# Patient Record
Sex: Female | Born: 1960 | Race: White | Hispanic: No | Smoking: Former smoker
Health system: Southern US, Community
[De-identification: ages and names within clinical notes are randomized; demographics above are authoritative.]

## PROBLEM LIST (undated history)

## (undated) DIAGNOSIS — Z800 Family history of malignant neoplasm of digestive organs: Secondary | ICD-10-CM

## (undated) DIAGNOSIS — R51 Headache: Secondary | ICD-10-CM

## (undated) DIAGNOSIS — R112 Nausea with vomiting, unspecified: Secondary | ICD-10-CM

## (undated) DIAGNOSIS — I10 Essential (primary) hypertension: Secondary | ICD-10-CM

## (undated) DIAGNOSIS — K219 Gastro-esophageal reflux disease without esophagitis: Secondary | ICD-10-CM

## (undated) DIAGNOSIS — C801 Malignant (primary) neoplasm, unspecified: Secondary | ICD-10-CM

## (undated) DIAGNOSIS — Z9889 Other specified postprocedural states: Secondary | ICD-10-CM

## (undated) DIAGNOSIS — N6489 Other specified disorders of breast: Secondary | ICD-10-CM

## (undated) DIAGNOSIS — Z808 Family history of malignant neoplasm of other organs or systems: Secondary | ICD-10-CM

## (undated) DIAGNOSIS — R519 Headache, unspecified: Secondary | ICD-10-CM

## (undated) HISTORY — DX: Family history of malignant neoplasm of other organs or systems: Z80.8

## (undated) HISTORY — DX: Family history of malignant neoplasm of digestive organs: Z80.0

## (undated) HISTORY — PX: TUBAL LIGATION: SHX77

---

## 1998-03-22 ENCOUNTER — Ambulatory Visit (HOSPITAL_COMMUNITY): Admission: RE | Admit: 1998-03-22 | Discharge: 1998-03-22 | Payer: Self-pay | Admitting: *Deleted

## 1998-06-09 ENCOUNTER — Other Ambulatory Visit: Admission: RE | Admit: 1998-06-09 | Discharge: 1998-06-09 | Payer: Self-pay | Admitting: *Deleted

## 1998-09-30 DIAGNOSIS — C801 Malignant (primary) neoplasm, unspecified: Secondary | ICD-10-CM

## 1998-09-30 HISTORY — DX: Malignant (primary) neoplasm, unspecified: C80.1

## 1998-09-30 HISTORY — PX: BREAST SURGERY: SHX581

## 1999-06-28 ENCOUNTER — Other Ambulatory Visit: Admission: RE | Admit: 1999-06-28 | Discharge: 1999-06-28 | Payer: Self-pay | Admitting: *Deleted

## 1999-08-02 ENCOUNTER — Other Ambulatory Visit: Admission: RE | Admit: 1999-08-02 | Discharge: 1999-08-02 | Payer: Self-pay | Admitting: Radiology

## 1999-08-15 ENCOUNTER — Other Ambulatory Visit: Admission: RE | Admit: 1999-08-15 | Discharge: 1999-08-15 | Payer: Self-pay | Admitting: Surgery

## 1999-08-15 ENCOUNTER — Encounter (INDEPENDENT_AMBULATORY_CARE_PROVIDER_SITE_OTHER): Payer: Self-pay | Admitting: Specialist

## 1999-08-22 ENCOUNTER — Encounter: Admission: RE | Admit: 1999-08-22 | Discharge: 1999-11-20 | Payer: Self-pay | Admitting: Radiation Oncology

## 1999-08-29 ENCOUNTER — Encounter: Payer: Self-pay | Admitting: Surgery

## 1999-08-30 ENCOUNTER — Encounter: Payer: Self-pay | Admitting: Surgery

## 1999-08-30 ENCOUNTER — Encounter (INDEPENDENT_AMBULATORY_CARE_PROVIDER_SITE_OTHER): Payer: Self-pay | Admitting: *Deleted

## 1999-08-30 ENCOUNTER — Ambulatory Visit (HOSPITAL_COMMUNITY): Admission: RE | Admit: 1999-08-30 | Discharge: 1999-08-30 | Payer: Self-pay | Admitting: Surgery

## 1999-12-24 ENCOUNTER — Encounter: Admission: RE | Admit: 1999-12-24 | Discharge: 2000-03-23 | Payer: Self-pay | Admitting: Radiation Oncology

## 2000-07-08 ENCOUNTER — Other Ambulatory Visit: Admission: RE | Admit: 2000-07-08 | Discharge: 2000-07-08 | Payer: Self-pay | Admitting: *Deleted

## 2001-01-08 ENCOUNTER — Ambulatory Visit (HOSPITAL_COMMUNITY): Admission: RE | Admit: 2001-01-08 | Discharge: 2001-01-08 | Payer: Self-pay | Admitting: Gastroenterology

## 2001-01-08 ENCOUNTER — Encounter (INDEPENDENT_AMBULATORY_CARE_PROVIDER_SITE_OTHER): Payer: Self-pay | Admitting: *Deleted

## 2001-07-10 ENCOUNTER — Other Ambulatory Visit: Admission: RE | Admit: 2001-07-10 | Discharge: 2001-07-10 | Payer: Self-pay | Admitting: *Deleted

## 2004-08-02 ENCOUNTER — Ambulatory Visit: Payer: Self-pay | Admitting: Oncology

## 2004-09-17 ENCOUNTER — Ambulatory Visit: Payer: Self-pay | Admitting: Oncology

## 2004-10-16 ENCOUNTER — Other Ambulatory Visit: Admission: RE | Admit: 2004-10-16 | Discharge: 2004-10-16 | Payer: Self-pay | Admitting: Internal Medicine

## 2005-01-09 ENCOUNTER — Ambulatory Visit: Payer: Self-pay | Admitting: Oncology

## 2005-04-10 ENCOUNTER — Ambulatory Visit: Payer: Self-pay | Admitting: Oncology

## 2005-10-31 ENCOUNTER — Other Ambulatory Visit: Admission: RE | Admit: 2005-10-31 | Discharge: 2005-10-31 | Payer: Self-pay | Admitting: Internal Medicine

## 2005-11-06 ENCOUNTER — Ambulatory Visit: Payer: Self-pay | Admitting: Oncology

## 2006-05-18 ENCOUNTER — Ambulatory Visit: Payer: Self-pay | Admitting: Oncology

## 2006-11-27 ENCOUNTER — Other Ambulatory Visit: Admission: RE | Admit: 2006-11-27 | Discharge: 2006-11-27 | Payer: Self-pay | Admitting: *Deleted

## 2006-12-16 ENCOUNTER — Encounter: Admission: RE | Admit: 2006-12-16 | Discharge: 2006-12-16 | Payer: Self-pay | Admitting: Radiology

## 2007-04-14 ENCOUNTER — Ambulatory Visit: Payer: Self-pay | Admitting: Oncology

## 2007-06-11 ENCOUNTER — Ambulatory Visit: Payer: Self-pay | Admitting: Oncology

## 2007-12-17 ENCOUNTER — Other Ambulatory Visit: Admission: RE | Admit: 2007-12-17 | Discharge: 2007-12-17 | Payer: Self-pay | Admitting: Family Medicine

## 2008-04-15 ENCOUNTER — Ambulatory Visit: Payer: Self-pay | Admitting: Oncology

## 2009-02-03 ENCOUNTER — Ambulatory Visit (HOSPITAL_COMMUNITY): Admission: RE | Admit: 2009-02-03 | Discharge: 2009-02-03 | Payer: Self-pay | Admitting: Obstetrics

## 2009-02-03 ENCOUNTER — Encounter (INDEPENDENT_AMBULATORY_CARE_PROVIDER_SITE_OTHER): Payer: Self-pay | Admitting: Obstetrics

## 2010-03-20 ENCOUNTER — Other Ambulatory Visit: Admission: RE | Admit: 2010-03-20 | Discharge: 2010-03-20 | Payer: Self-pay | Admitting: Family Medicine

## 2011-01-08 LAB — COMPREHENSIVE METABOLIC PANEL
ALT: 25 U/L (ref 0–35)
AST: 23 U/L (ref 0–37)
Albumin: 4.1 g/dL (ref 3.5–5.2)
Alkaline Phosphatase: 85 U/L (ref 39–117)
BUN: 12 mg/dL (ref 6–23)
CO2: 27 mEq/L (ref 19–32)
Calcium: 9.7 mg/dL (ref 8.4–10.5)
Chloride: 103 mEq/L (ref 96–112)
Creatinine, Ser: 0.85 mg/dL (ref 0.4–1.2)
GFR calc Af Amer: >60 mL/min (ref >60)
GFR calc non Af Amer: >60 mL/min (ref >60)
Glucose, Bld: 110 mg/dL — ABNORMAL HIGH (ref 70–99)
Potassium: 4.3 mEq/L (ref 3.5–5.1)
Sodium: 138 mEq/L (ref 135–145)
Total Bilirubin: 0.5 mg/dL (ref 0.3–1.2)
Total Protein: 7 g/dL (ref 6.0–8.3)

## 2011-01-08 LAB — CBC
HCT: 39.9 % (ref 36.0–46.0)
Hemoglobin: 14.1 g/dL (ref 12.0–15.0)
MCHC: 35.4 g/dL (ref 30.0–36.0)
MCV: 91.8 fL (ref 78.0–100.0)
Platelets: 260 10*3/uL (ref 150–400)
RBC: 4.34 MIL/uL (ref 3.87–5.11)
RDW: 12.6 % (ref 11.5–15.5)
WBC: 5.3 10*3/uL (ref 4.0–10.5)

## 2011-01-08 LAB — URINALYSIS, ROUTINE W REFLEX MICROSCOPIC
Glucose, UA: NEGATIVE mg/dL
pH: 5.5 (ref 5.0–8.0)

## 2011-01-08 LAB — TYPE AND SCREEN
ABO/RH(D): B POS
Antibody Screen: NEGATIVE

## 2011-02-12 NOTE — Op Note (Signed)
[name], [name] NO.:  [id number]   MEDICAL RECORD NO.:  [id number]         PATIENT TYPE:  WOIB   LOCATION:                                FACILITY:  [facility]   PHYSICIAN:  Lendon Colonel, MD   DATE OF BIRTH:  [birth year]   DATE OF PROCEDURE:  02/03/2009  DATE OF DISCHARGE:                               OPERATIVE REPORT   PREOPERATIVE DIAGNOSES:  Lateral wall vaginal cyst, rectocele.   POSTOPERATIVE DIAGNOSIS:  Lateral wall vaginal cyst, rectocele.   PROCEDURE:  Removal of vaginal cyst, rectocele repair.   SURGEON:  Lendon Colonel, MD   ASSISTANT:  Eliberto Ivory. Dickstein, MD   ANESTHESIA:  General.   FINDING:  Grade 2 rectocele, 5-cm right lateral vaginal wall cyst of  unclear etiology.   SPECIMENS:  Vaginal cyst, this was sent to Pathology.   ANTIBIOTICS:  2 g of Ancef.   ESTIMATED BLOOD LOSS:  Minimal.   COMPLICATIONS:  None.   INDICATION:  This is a 50 year old postmenopausal patient with history  of prolapsing 5-cm right lateral vaginal wall cyst and a grade 2  rectocele.  Options for management including cyst drainage and pessary  were discussed with the patient.  The patient elected to proceed with  surgical options after a full and informed consent including risk of  postoperative dyspareunia.   PROCEDURE:  After informed consent was obtained, the patient was taken  to the operating room where general anesthesia was initiated without  difficulty.  She was prepped and draped in the normal sterile fashion in  dorsal lithotomy position.  A Foley catheter was placed into the  bladder.  Both, vaginal and rectal examinations were done by the surgeon  and the assistant for preoperative mapping.  It became clear that the  vaginal cyst wall was not connected to either the rectum or bladder.  Decision was made to completely dissect out that cyst and then proceed  with a rectocele repair.  The edges of the vaginal skin overlying the  cyst were marked  with Allis clamps.  A solution of 0.5% Sensorcaine with  100,000-200,000 units of epinephrine were injected throughout the case.  A total of 10 mL were injected in the vaginal mucosa.  This was done for  pain control and hemostasis.  The vaginal mucosa over the cyst wall were  tagged with Allis clamps and the mucosal tissue was injected as noted  above.  A #15 blade was used to incise the vaginal mucosa along the  length of the vaginal cyst and using Strully and Metzenbaum scissors as  well as Allis clamps placed along the edges of the incision, the vaginal  cyst wall was dissected out both sharply and blunt dissection with a  gauze on a finger was used.  The cyst dissected out nicely although at  one point, an ostomy was created in the cyst with drainage of a brownish  liquid.  No malodor was noted to the liquid and repeat rectal exams were  performed several times during the case to ensure that there was no  connection between the cyst and the rectum.  Gloves were changed each  time a rectal exam was carried out.  The cyst was completely dissected  out, was about 5 cm and at the base of the cyst, a Kelly clamp was  placed along the base.  The cyst was excised, a free tie and then a  suture tie was done along the base of the cyst.  The specimen was sent  to Pathology.  The defect in the vaginal mucosa was left.  A gauze was  placed for hemostasis and the rectocele was begun.  Kocher clamps were  placed on the hymenal ring and a third was placed on the perineal skin.  A triangular piece of skin was excised with the scalpel and Metzenbaums  after being injected with the solution.  Another Allis clamp was placed  at the apex of the rectocele, this was also the lower limit of the  dissection for the vaginal cyst wall.  The mucosa was scored with Bovie  cautery to ensure dissection stayed in the midline.  Hydrodissection was  carried out with the anesthetic with epinephrine and a Metzenbaum  was  used to undermine the vaginal mucosa and incise the vaginal mucosa in  the midline.  Allis clamps were placed on the edges of the vaginal  mucosa and using blunt and sharp dissection with both, Metzenbaums and  Strully, the rectovaginal fascia was separated from the vaginal mucosa.  Once fully dissected and hemostatic, interrupted 2-0 Vicryls were used  to plicate the rectovaginal fascia in a horizontal fashion.  Excellent  supportive recreation of the rectovaginal fascia was noted.  The vaginal  mucosa was then closed in a running fashion with 2-0 Vicryl.  The  rectocele was repaired separately from the vaginal cyst repair as that  mucosal defect deviated laterally to the right.  Once at the hymenal  ring, decision was made to proceed with a suture for perineal support.  0 Vicryl on CT1 was used to reapproximate the bulbocavernosus and the  levator muscle, 2 separate stitches were used.  The second stitch  grabbing rectovaginal mucosa to bulbocavernosus on the left to  bulbocavernosus on the right and rectovaginal fascia on the right.  These stitches were then tied down.  The remainder of the incision was  repaired in a standard episiotomy format.  A single 4-0 Vicryl was used  on the left vaginal mucosa to repair a single defect.  Excellent  hemostasis was noted.  The procedure was terminated after examining  finger in the rectum revealed no suture in the rectum.  Estrace cream  was placed in the vagina.  No vaginal packing was done.  The Foley  catheter was removed and the procedure was terminated.  The patient  tolerated the procedure well.  Sponge, lap, and needle counts were  correct x3.  The patient was taken to the recovery room in a stable  condition.      Lendon Colonel, MD  Electronically Signed     KAF/MEDQ  D:  02/03/2009  T:  02/04/2009  Job:  [id number]

## 2011-02-15 NOTE — Procedures (Signed)
. [facility]  Patient:    [name], [name]                          MRN: [id number] Proc. Date: 01/08/01 Attending:  Verlin Grills, M.D. CC:         Darius Bump, M.D.  Leighton Roach. Truett Perna, M.D.   Procedure Report  REFERRING PHYSICIANS:  Leighton Roach. Truett Perna, M.D., and Darius Bump, M.D.  PROCEDURE INDICATION:  [name] (DOB: 1961/03/28) is a 50 year old female.  She has received treatment for breast cancer.  Her mother was diagnosed with colon cancer.  Ms. Jehle is due for surveillance colonoscopy and polypectomy to prevent colon cancer.  I discussed with Ms. Hoffert the complications associated with colonoscopy and polypectomy including intestinal bleeding and intestinal perforation.  Ms. Cromie has signed the operative permit.  ENDOSCOPIST:  Verlin Grills, M.D.  PREMEDICATION:  Fentanyl 50 mcg and Versed 7.5 mg.  ENDOSCOPE: Olympus pediatric colonoscope.  PROCEDURE:  After obtaining an informed consent, the patient was placed in the left lateral decubitus position.  She was administered intravenous Fentanyl and intravenous Versed to achieve conscious sedation for the procedure.  The patients blood pressure, oxygen saturation, and cardiac rhythm were monitored throughout the procedure and documented in the medical record.  Anal inspection was normal.  Digital rectal exam was normal.  The Olympus pediatric video colonoscope was introduced into the rectum, and under direct vision advanced to the cecum.  This was identified by normal-appearing ileocecal valve.  Colonic preparation for the exam today was excellent.  Rectum:  from the proximal rectum, a 0.5 mm sessile polyp was removed with the cold biopsy forceps and submitted for pathological interpretation.  Sigmoid colon and descending colon:  extensive left colonic diverticulosis.  Splenic flexure:  normal.  Transverse colon:  normal.  Hepatic flexure:   normal.  Ascending colon:  normal.  Cecum and ileocecal valve:  normal.  ASSESSMENT: 1. Left colonic diverticulosis. 2. A 0.5 mm sessile polyp was removed from the rectum.  RECOMMENDATIONS:  Repeat colonoscopy in five years. DD:  01/08/01 TD:  01/08/01 Job: [id number]

## 2013-02-16 ENCOUNTER — Other Ambulatory Visit (HOSPITAL_COMMUNITY)
Admission: RE | Admit: 2013-02-16 | Discharge: 2013-02-16 | Disposition: A | Discharge status: Home / Self Care | Payer: Federal, State, Local not specified - PPO | Source: Ambulatory Visit | Attending: Family Medicine | Admitting: Family Medicine

## 2013-02-16 ENCOUNTER — Other Ambulatory Visit: Payer: Self-pay | Admitting: Family Medicine

## 2013-02-16 DIAGNOSIS — Z124 Encounter for screening for malignant neoplasm of cervix: Secondary | ICD-10-CM | POA: Insufficient documentation

## 2014-03-21 ENCOUNTER — Ambulatory Visit: Payer: Self-pay | Admitting: Podiatry

## 2014-03-24 ENCOUNTER — Ambulatory Visit (INDEPENDENT_AMBULATORY_CARE_PROVIDER_SITE_OTHER): Payer: Federal, State, Local not specified - PPO

## 2014-03-24 ENCOUNTER — Encounter: Payer: Self-pay | Admitting: Podiatry

## 2014-03-24 ENCOUNTER — Ambulatory Visit (INDEPENDENT_AMBULATORY_CARE_PROVIDER_SITE_OTHER): Payer: Federal, State, Local not specified - PPO | Admitting: Podiatry

## 2014-03-24 VITALS — BP 138/87 | HR 55 | Resp 15 | Ht 66.0 in | Wt 171.0 lb

## 2014-03-24 DIAGNOSIS — B079 Viral wart, unspecified: Secondary | ICD-10-CM

## 2014-03-24 DIAGNOSIS — D3610 Benign neoplasm of peripheral nerves and autonomic nervous system, unspecified: Secondary | ICD-10-CM

## 2014-03-24 DIAGNOSIS — D219 Benign neoplasm of connective and other soft tissue, unspecified: Secondary | ICD-10-CM

## 2014-03-24 NOTE — Progress Notes (Signed)
Subjective:     Patient ID: [name], female   DOB: 10/12/1960, 53 y.o.   MRN: [id number]  HPI patient presents with several problems on her left foot one being large plantar keratotic lesion around the fourth metatarsal and one being shooting radiating pain between the third and fourth toes that occurs at different times especially with certain types of shoe gear   Review of Systems  All other systems reviewed and are negative.      Objective:   Physical Exam  Nursing note and vitals reviewed. Constitutional: She is oriented to person, place, and time.  Cardiovascular: Intact distal pulses.   Musculoskeletal: Normal range of motion.  Neurological: She is oriented to person, place, and time.  Skin: Skin is warm.   neurovascular status intact with muscle strength adequate and range of motion subtalar midtarsal joint within normal limits. Later lesion left it's quite large measuring about 1.5 x 1.5 cm that upon debridement I did note pinpoint bleeding and pain to lateral pressure. 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 ray. Did inject the left third interspace with a neuro lysis dehydration and alcohol solution today and after appropriate debridement of the plantar lesion applied chemical to attempt to invoke a response and kill viral tissue. Reappoint to recheck

## 2014-03-24 NOTE — Progress Notes (Signed)
   Subjective:    Patient ID: [name], female    DOB: 10-28-1960, 53 y.o.   MRN: [id number]  HPI Comments: Pt states she believes she has a plantars wart on the left plantar foot under the 5th toe, and possibly on the left medial heel, for about 9 months.  Pt complains of a sharp shooting pain in the left 3, 4th toes, like a severe cramp, for about 9 months.     Review of Systems  All other systems reviewed and are negative.      Objective:   Physical Exam        Assessment & Plan:

## 2014-04-21 ENCOUNTER — Encounter: Payer: Self-pay | Admitting: Podiatry

## 2014-04-21 ENCOUNTER — Ambulatory Visit (INDEPENDENT_AMBULATORY_CARE_PROVIDER_SITE_OTHER): Payer: Federal, State, Local not specified - PPO | Admitting: Podiatry

## 2014-04-21 VITALS — BP 115/69 | HR 61 | Resp 16

## 2014-04-21 DIAGNOSIS — G5762 Lesion of plantar nerve, left lower limb: Secondary | ICD-10-CM

## 2014-04-21 DIAGNOSIS — G5760 Lesion of plantar nerve, unspecified lower limb: Secondary | ICD-10-CM

## 2014-04-21 DIAGNOSIS — B079 Viral wart, unspecified: Secondary | ICD-10-CM

## 2014-04-22 NOTE — Progress Notes (Signed)
Subjective:     Patient ID: [name], female   DOB: 02/15/1961, 53 y.o.   MRN: [id number]  HPI patient presents stating the lesion is still present on the bottom my foot that seems some better and I'm still getting pain between the third and fourth toes in my left foot with shooting pains noted upon wearing of tighter shoes   Review of Systems     Objective:   Physical Exam Neurovascular status intact with no health history changes noted and noted that there is discomfort still between the third and fourth toes and lesion underneath the left foot that upon debridement continues to exhibit pinpoint bleeding but appears to be much thinner    Assessment:     Neuroma symptoms still present left and also verruca plantaris left which is improving but still present    Plan:     Reviewed both conditions and did a neuro lysis injection third interspace with a purified dehydrated alcohol solution and Marcaine. I then went ahead and debris did plantar lesion left and applied chemical to kill remaining wart tissue

## 2014-05-19 ENCOUNTER — Ambulatory Visit (INDEPENDENT_AMBULATORY_CARE_PROVIDER_SITE_OTHER): Payer: Federal, State, Local not specified - PPO | Admitting: Podiatry

## 2014-05-19 ENCOUNTER — Encounter: Payer: Self-pay | Admitting: Podiatry

## 2014-05-19 VITALS — BP 132/76 | HR 61 | Resp 16

## 2014-05-19 DIAGNOSIS — G5760 Lesion of plantar nerve, unspecified lower limb: Secondary | ICD-10-CM

## 2014-05-19 DIAGNOSIS — D219 Benign neoplasm of connective and other soft tissue, unspecified: Secondary | ICD-10-CM

## 2014-05-19 DIAGNOSIS — D3610 Benign neoplasm of peripheral nerves and autonomic nervous system, unspecified: Secondary | ICD-10-CM

## 2014-05-19 DIAGNOSIS — G5762 Lesion of plantar nerve, left lower limb: Secondary | ICD-10-CM

## 2014-05-19 NOTE — Progress Notes (Signed)
Subjective:     Patient ID: [name], female   DOB: 06-20-1961, 53 y.o.   MRN: [id number]  HPI patient states that the left foot is doing much better underneath and also in between the third and fourth toes with significant diminishment of   Review of Systems     Objective:   Physical Exam Neurovascular status intact with significant diminishment of forefoot pain left with inflammation of a mild nature noted on the plantar surface but keratotic lesions which have completely resolved currently    Assessment:     Improve verruca plantaris left and improve neuroma symptomatology left    Plan:     Reviewed conditions and discussed possibility for recurrent. Today cleaned up a little bit of tissue but cannot recommend anything invasive and less symptoms were to warrant

## 2014-10-17 ENCOUNTER — Other Ambulatory Visit: Payer: Self-pay | Admitting: Family Medicine

## 2014-10-17 ENCOUNTER — Ambulatory Visit
Admission: RE | Admit: 2014-10-17 | Discharge: 2014-10-17 | Disposition: A | Discharge status: Home / Self Care | Payer: Federal, State, Local not specified - PPO | Source: Ambulatory Visit | Attending: Family Medicine | Admitting: Family Medicine

## 2014-10-17 DIAGNOSIS — R059 Cough, unspecified: Secondary | ICD-10-CM

## 2014-10-17 DIAGNOSIS — R05 Cough: Secondary | ICD-10-CM

## 2015-06-16 ENCOUNTER — Encounter: Payer: Self-pay | Admitting: Podiatry

## 2015-06-16 ENCOUNTER — Ambulatory Visit (INDEPENDENT_AMBULATORY_CARE_PROVIDER_SITE_OTHER): Payer: Federal, State, Local not specified - PPO | Admitting: Podiatry

## 2015-06-16 ENCOUNTER — Ambulatory Visit (INDEPENDENT_AMBULATORY_CARE_PROVIDER_SITE_OTHER): Payer: Federal, State, Local not specified - PPO

## 2015-06-16 VITALS — BP 120/68 | HR 69 | Resp 16

## 2015-06-16 DIAGNOSIS — M722 Plantar fascial fibromatosis: Secondary | ICD-10-CM | POA: Diagnosis not present

## 2015-06-16 DIAGNOSIS — M79671 Pain in right foot: Secondary | ICD-10-CM

## 2015-06-16 MED ORDER — TRIAMCINOLONE ACETONIDE 10 MG/ML IJ SUSP: 10.0000 mg | Freq: Once | INTRAMUSCULAR | Status: DC

## 2015-06-16 MED ORDER — DICLOFENAC SODIUM 75 MG PO TBEC: 75.0000 mg | DELAYED_RELEASE_TABLET | Freq: Two times a day (BID) | ORAL | Status: DC

## 2015-06-16 NOTE — Patient Instructions (Signed)

## 2015-06-18 NOTE — Progress Notes (Signed)
Subjective:     Patient ID: [name], female   DOB: [birth year], 54 y.o.   MRN: [id number]  HPI patient presents stating I have had pain in my right heel and it's been getting gradually worse. States it's been hurting for several months and making it increasingly difficult to walk and she has tried reduced activity and supportive shoes without relief   Review of Systems  All other systems reviewed and are negative.      Objective:   Physical Exam Vascular status intact muscle strength adequate with generalized foot pain noted right and specific pain to the right plantar fascia at the insertion of the tendon into the calcaneus    Assessment:     Generalized foot pain which may be compensatory in nature or related to inflammatory capsulitis and acute plantar fasciitis right    Plan:     H&P and x-rays reviewed and both conditions discussed. Today I injected the right plantar fascia 3 Milligan Kenalog 5 mg Xylocaine and instructed on physical therapy and utilization of fascially brace. Discussed other pain and hopefully will resolve with anti-inflammatory reduced activity and better walking gait

## 2015-07-05 ENCOUNTER — Encounter: Payer: Self-pay | Admitting: Podiatry

## 2015-07-05 ENCOUNTER — Ambulatory Visit (INDEPENDENT_AMBULATORY_CARE_PROVIDER_SITE_OTHER): Payer: Federal, State, Local not specified - PPO | Admitting: Podiatry

## 2015-07-05 DIAGNOSIS — M722 Plantar fascial fibromatosis: Secondary | ICD-10-CM

## 2015-07-05 DIAGNOSIS — M79671 Pain in right foot: Secondary | ICD-10-CM

## 2015-07-05 MED ORDER — DICLOFENAC SODIUM 75 MG PO TBEC: 75.0000 mg | DELAYED_RELEASE_TABLET | Freq: Two times a day (BID) | ORAL | Status: DC

## 2015-07-05 MED ORDER — TRIAMCINOLONE ACETONIDE 10 MG/ML IJ SUSP
10.0000 mg | Freq: Once | INTRAMUSCULAR | Status: AC
  Administered 2015-07-05: 10 mg

## 2015-07-05 NOTE — Progress Notes (Signed)
Subjective:     Patient ID: [name], female   DOB: 1961/09/12, 54 y.o.   MRN: [id number]  HPI patient states my foot is improved but still painful when present and it's been going on for a while   Review of Systems     Objective:   Physical Exam Neurovascular status intact with discomfort in the plantar fascial right that upon palpation still shows to be quite intense when pressed with fluid buildup and moderate depression of the arch    Assessment:     Acute plantar fasciitis still present with depression of the arch indicating long-term problem    Plan:     Reinjected the plantar fascia right 3 mg Kenalog 5 mill grams Xylocaine and instructed on physical therapy and dispensed and scanned for a custom type orthotic device of a Berkley nature in order to try to support the heel and arch and will be at least 10 mm in depth

## 2015-07-28 ENCOUNTER — Ambulatory Visit: Payer: Federal, State, Local not specified - PPO | Admitting: *Deleted

## 2015-07-28 DIAGNOSIS — M722 Plantar fascial fibromatosis: Secondary | ICD-10-CM

## 2015-07-28 NOTE — Patient Instructions (Signed)

## 2015-07-28 NOTE — Progress Notes (Signed)
Patient ID: [name], female   DOB: 1961-03-14, 54 y.o.   MRN: [id number] Patient presents for orthotic pick up.  Verbal and written break in and wear instructions given.  Patient will follow up in 4 weeks if symptoms worsen or fail to improve.

## 2015-08-28 ENCOUNTER — Ambulatory Visit: Payer: Federal, State, Local not specified - PPO | Admitting: Podiatry

## 2016-03-14 ENCOUNTER — Other Ambulatory Visit (HOSPITAL_COMMUNITY)
Admission: RE | Admit: 2016-03-14 | Discharge: 2016-03-14 | Disposition: A | Discharge status: Home / Self Care | Payer: Federal, State, Local not specified - PPO | Source: Ambulatory Visit | Attending: Family Medicine | Admitting: Family Medicine

## 2016-03-14 ENCOUNTER — Other Ambulatory Visit: Payer: Self-pay | Admitting: Family Medicine

## 2016-03-14 DIAGNOSIS — Z124 Encounter for screening for malignant neoplasm of cervix: Secondary | ICD-10-CM | POA: Diagnosis present

## 2016-03-18 LAB — CYTOLOGY - PAP

## 2016-04-25 ENCOUNTER — Other Ambulatory Visit: Payer: Self-pay | Admitting: Gastroenterology

## 2016-05-10 ENCOUNTER — Encounter (HOSPITAL_COMMUNITY): Payer: Self-pay | Admitting: *Deleted

## 2016-05-21 ENCOUNTER — Encounter (HOSPITAL_COMMUNITY): Payer: Self-pay | Admitting: *Deleted

## 2016-05-21 ENCOUNTER — Ambulatory Visit (HOSPITAL_COMMUNITY)
Admission: RE | Admit: 2016-05-21 | Discharge: 2016-05-21 | Disposition: A | Discharge status: Home / Self Care | Payer: Federal, State, Local not specified - PPO | Source: Ambulatory Visit | Attending: Gastroenterology | Admitting: Gastroenterology

## 2016-05-21 ENCOUNTER — Encounter (HOSPITAL_COMMUNITY)
Admission: RE | Disposition: A | Discharge status: Home / Self Care | Payer: Self-pay | Source: Ambulatory Visit | Attending: Gastroenterology

## 2016-05-21 ENCOUNTER — Ambulatory Visit (HOSPITAL_COMMUNITY): Payer: Federal, State, Local not specified - PPO | Admitting: Certified Registered Nurse Anesthetist

## 2016-05-21 DIAGNOSIS — I341 Nonrheumatic mitral (valve) prolapse: Secondary | ICD-10-CM | POA: Insufficient documentation

## 2016-05-21 DIAGNOSIS — K219 Gastro-esophageal reflux disease without esophagitis: Secondary | ICD-10-CM | POA: Diagnosis not present

## 2016-05-21 DIAGNOSIS — Z87891 Personal history of nicotine dependence: Secondary | ICD-10-CM | POA: Diagnosis not present

## 2016-05-21 DIAGNOSIS — I340 Nonrheumatic mitral (valve) insufficiency: Secondary | ICD-10-CM | POA: Insufficient documentation

## 2016-05-21 DIAGNOSIS — Z800 Family history of malignant neoplasm of digestive organs: Secondary | ICD-10-CM | POA: Insufficient documentation

## 2016-05-21 DIAGNOSIS — Z9221 Personal history of antineoplastic chemotherapy: Secondary | ICD-10-CM | POA: Insufficient documentation

## 2016-05-21 DIAGNOSIS — Z1211 Encounter for screening for malignant neoplasm of colon: Secondary | ICD-10-CM | POA: Diagnosis not present

## 2016-05-21 DIAGNOSIS — Z853 Personal history of malignant neoplasm of breast: Secondary | ICD-10-CM | POA: Diagnosis not present

## 2016-05-21 DIAGNOSIS — K5730 Diverticulosis of large intestine without perforation or abscess without bleeding: Secondary | ICD-10-CM | POA: Diagnosis not present

## 2016-05-21 DIAGNOSIS — Z923 Personal history of irradiation: Secondary | ICD-10-CM | POA: Insufficient documentation

## 2016-05-21 HISTORY — DX: Headache: R51

## 2016-05-21 HISTORY — DX: Headache, unspecified: R51.9

## 2016-05-21 HISTORY — DX: Malignant (primary) neoplasm, unspecified: C80.1

## 2016-05-21 HISTORY — PX: COLONOSCOPY WITH PROPOFOL: SHX5780

## 2016-05-21 HISTORY — DX: Other specified postprocedural states: Z98.890

## 2016-05-21 HISTORY — DX: Gastro-esophageal reflux disease without esophagitis: K21.9

## 2016-05-21 HISTORY — DX: Nausea with vomiting, unspecified: R11.2

## 2016-05-21 SURGERY — COLONOSCOPY WITH PROPOFOL
Anesthesia: Monitor Anesthesia Care

## 2016-05-21 MED ORDER — ONDANSETRON HCL 4 MG/2ML IJ SOLN
INTRAMUSCULAR | Status: AC
  Filled 2016-05-21: qty 2

## 2016-05-21 MED ORDER — LIDOCAINE HCL (CARDIAC) 20 MG/ML IV SOLN
INTRAVENOUS | Status: DC | PRN
  Administered 2016-05-21: 100 mg via INTRAVENOUS

## 2016-05-21 MED ORDER — LIDOCAINE HCL (CARDIAC) 20 MG/ML IV SOLN
INTRAVENOUS | Status: AC
  Filled 2016-05-21: qty 5

## 2016-05-21 MED ORDER — ONDANSETRON HCL 4 MG/2ML IJ SOLN
INTRAMUSCULAR | Status: DC | PRN
  Administered 2016-05-21: 4 mg via INTRAVENOUS

## 2016-05-21 MED ORDER — PROPOFOL 10 MG/ML IV BOLUS
INTRAVENOUS | Status: DC | PRN
  Administered 2016-05-21 (×3): 20 mg via INTRAVENOUS
  Administered 2016-05-21: 40 mg via INTRAVENOUS

## 2016-05-21 MED ORDER — LACTATED RINGERS IV SOLN
INTRAVENOUS | Status: DC | PRN
  Administered 2016-05-21: 13:00:00 via INTRAVENOUS

## 2016-05-21 MED ORDER — PROPOFOL 10 MG/ML IV BOLUS
INTRAVENOUS | Status: AC
  Filled 2016-05-21: qty 60

## 2016-05-21 MED ORDER — PROPOFOL 500 MG/50ML IV EMUL
INTRAVENOUS | Status: DC | PRN
  Administered 2016-05-21: 75 ug/kg/min via INTRAVENOUS

## 2016-05-21 SURGICAL SUPPLY — 22 items

## 2016-05-21 NOTE — Op Note (Signed)
[facility] Patient Name: [name] Procedure Date: 05/21/2016 MRN: [id number] Attending MD: Garlan Fair , MD Date of Birth: May 22, 1961 CSN: [id number] Age: 55 Admit Type: Outpatient Procedure:                Colonoscopy Indications:              Screening in patient at increased risk: Colorectal                            cancer in mother before age 61 Providers:                Garlan Fair, MD, Laverta Baltimore RN, RN,                            Cherylynn Ridges, Technician, Adair Laundry, CRNA Referring MD:              Medicines:                Propofol per Anesthesia Complications:            No immediate complications. Estimated Blood Loss:     Estimated blood loss: none. Procedure:                Pre-Anesthesia Assessment:                           - Prior to the procedure, a History and Physical                            was performed, and patient medications and                            allergies were reviewed. The patient's tolerance of                            previous anesthesia was also reviewed. The risks                            and benefits of the procedure and the sedation                            options and risks were discussed with the patient.                            All questions were answered, and informed consent                            was obtained. Prior Anticoagulants: The patient has                            taken no previous anticoagulant or antiplatelet                            agents. ASA Grade Assessment: II - A patient with  mild systemic disease. After reviewing the risks                            and benefits, the patient was deemed in                            satisfactory condition to undergo the procedure.                           After obtaining informed consent, the colonoscope                            was passed under direct vision. Throughout the      procedure, the patient's blood pressure, pulse, and                            oxygen saturations were monitored continuously. The                            [id number] scope was introduced through                            the anus and advanced to the the cecum, identified                            by appendiceal orifice and ileocecal valve. The                            colonoscopy was technically difficult and complex                            due to significant looping. The patient tolerated                            the procedure well. The quality of the bowel                            preparation was good. The ileocecal valve, the                            appendiceal orifice and the rectum were                            photographed. Scope In: 1:08:11 PM Scope Out: 1:28:55 PM Scope Withdrawal Time: 0 hours 7 minutes 27 seconds  Total Procedure Duration: 0 hours 20 minutes 44 seconds  Findings:      The perianal and digital rectal examinations were normal.      The entire examined colon appeared normal. Left colonic diverticulosis       was present. Impression:               - The entire examined colon is normal.                           - No specimens  collected. Moderate Sedation:      N/A- Per Anesthesia Care Recommendation:           - Patient has a contact number available for                            emergencies. The signs and symptoms of potential                            delayed complications were discussed with the                            patient. Return to normal activities tomorrow.                            Written discharge instructions were provided to the                            patient.                           - Repeat colonoscopy in 5 years for screening                            purposes.                           - Resume previous diet.                           - Continue present medications. Procedure Code(s):        ---  Professional ---                           [id number], Colorectal cancer screening; colonoscopy on                            individual at high risk Diagnosis Code(s):        --- Professional ---                           Z80.0, Family history of malignant neoplasm of                            digestive organs CPT copyright 2016 [employer]. All rights reserved. The codes documented in this report are preliminary and upon coder review may  be revised to meet current compliance requirements. Earle Gell, MD Garlan Fair, MD 05/21/2016 1:36:20 PM This report has been signed electronically. Number of Addenda: 0

## 2016-05-21 NOTE — Anesthesia Postprocedure Evaluation (Signed)
Anesthesia Post Note  Patient: [name]  Procedure(s) Performed: Procedure(s) (LRB): COLONOSCOPY WITH PROPOFOL (N/A)  Patient location during evaluation: PACU Anesthesia Type: MAC Level of consciousness: awake and alert Pain management: pain level controlled Vital Signs Assessment: post-procedure vital signs reviewed and stable Respiratory status: spontaneous breathing, nonlabored ventilation, respiratory function stable and patient connected to nasal cannula oxygen Cardiovascular status: stable and blood pressure returned to baseline Anesthetic complications: no    Last Vitals:  Vitals:   05/21/16 1357 05/21/16 1358  BP: (!) 145/88 136/77  Pulse: (!) 40 (!) 43  Resp: 10 11  Temp:      Last Pain:  Vitals:   05/21/16 1126  TempSrc: Oral                 [name]

## 2016-05-21 NOTE — H&P (Signed)
  Procedure: Screening colonoscopy. 02/12/2011 normal baseline screening colonoscopy was performed. Mother was diagnosed with colon cancer under age 55  History: The patient is a 55 year old female born Feb 19, 1961. She is scheduled to undergo a repeat screening colonoscopy today  Past medical history: Breast cancer. Breast lumpectomy followed by radiation and chemotherapy. Hypercholesterolemia. Mild mitral valve prolapse with mitral regurgitation. Cesarean section. Bilateral tubal ligation.  Allergies: Latex  Exam: The patient is alert and lying comfortably on the endoscopy stretcher. Abdomen is soft and nontender to palpation. Lungs are clear to auscultation. Cardiac exam reveals a regular rhythm.  Plan: Proceed with screening colonoscopy

## 2016-05-21 NOTE — Anesthesia Preprocedure Evaluation (Signed)
Anesthesia Evaluation  Patient identified by MRN, date of birth, ID band Patient awake    Reviewed: Allergy & Precautions, H&P , NPO status , Patient's Chart, lab work & pertinent test results  History of Anesthesia Complications (+) PONV and history of anesthetic complications  Airway Mallampati: II  TM Distance: >3 FB Neck ROM: full    Dental no notable dental hx.    Pulmonary former smoker,    Pulmonary exam normal breath sounds clear to auscultation       Cardiovascular negative cardio ROS Normal cardiovascular exam Rhythm:regular Rate:Normal     Neuro/Psych negative neurological ROS     GI/Hepatic Neg liver ROS, GERD  ,  Endo/Other  negative endocrine ROS  Renal/GU negative Renal ROS     Musculoskeletal   Abdominal   Peds  Hematology negative hematology ROS (+)   Anesthesia Other Findings   Reproductive/Obstetrics negative OB ROS                             Anesthesia Physical Anesthesia Plan  ASA: II  Anesthesia Plan: MAC   Post-op Pain Management:    Induction: Intravenous  Airway Management Planned:   Additional Equipment:   Intra-op Plan:   Post-operative Plan:   Informed Consent: I have reviewed the patients History and Physical, chart, labs and discussed the procedure including the risks, benefits and alternatives for the proposed anesthesia with the patient or authorized representative who has indicated his/her understanding and acceptance.   Dental Advisory Given  Plan Discussed with: Anesthesiologist, CRNA and Surgeon  Anesthesia Plan Comments:         Anesthesia Quick Evaluation

## 2016-05-21 NOTE — Transfer of Care (Signed)
Immediate Anesthesia Transfer of Care Note  Patient: [name]  Procedure(s) Performed: Procedure(s): COLONOSCOPY WITH PROPOFOL (N/A)  Patient Location: ENDO  Anesthesia Type:MAC  Level of Consciousness:  sedated, patient cooperative and responds to stimulation  Airway & Oxygen Therapy:Patient Spontanous Breathing and Patient connected to face mask oxgen  Post-op Assessment:  Report given to ENDO RN and Post -op Vital signs reviewed and stable  Post vital signs:  Reviewed and stable  Last Vitals:  Vitals:   05/21/16 1126  BP: 130/75  Resp: 10  Temp: 123XX123 C    Complications: No apparent anesthesia complications

## 2016-05-21 NOTE — Discharge Instructions (Signed)

## 2016-05-22 ENCOUNTER — Encounter (HOSPITAL_COMMUNITY): Payer: Self-pay | Admitting: Gastroenterology

## 2016-09-09 DIAGNOSIS — Z1231 Encounter for screening mammogram for malignant neoplasm of breast: Secondary | ICD-10-CM | POA: Diagnosis not present

## 2016-09-09 DIAGNOSIS — Z853 Personal history of malignant neoplasm of breast: Secondary | ICD-10-CM | POA: Diagnosis not present

## 2016-09-12 DIAGNOSIS — E782 Mixed hyperlipidemia: Secondary | ICD-10-CM | POA: Diagnosis not present

## 2018-04-14 ENCOUNTER — Emergency Department (HOSPITAL_COMMUNITY)
Admission: EM | Admit: 2018-04-14 | Discharge: 2018-04-14 | Disposition: A | Discharge status: Home / Self Care | Payer: Federal, State, Local not specified - PPO | Attending: Emergency Medicine | Admitting: Emergency Medicine

## 2018-04-14 ENCOUNTER — Encounter (HOSPITAL_COMMUNITY): Payer: Self-pay | Admitting: Emergency Medicine

## 2018-04-14 ENCOUNTER — Emergency Department (HOSPITAL_COMMUNITY): Payer: Federal, State, Local not specified - PPO

## 2018-04-14 DIAGNOSIS — R001 Bradycardia, unspecified: Secondary | ICD-10-CM | POA: Diagnosis not present

## 2018-04-14 DIAGNOSIS — Z79899 Other long term (current) drug therapy: Secondary | ICD-10-CM | POA: Insufficient documentation

## 2018-04-14 DIAGNOSIS — Z853 Personal history of malignant neoplasm of breast: Secondary | ICD-10-CM | POA: Insufficient documentation

## 2018-04-14 DIAGNOSIS — Z87891 Personal history of nicotine dependence: Secondary | ICD-10-CM | POA: Insufficient documentation

## 2018-04-14 DIAGNOSIS — H539 Unspecified visual disturbance: Secondary | ICD-10-CM | POA: Diagnosis not present

## 2018-04-14 DIAGNOSIS — R05 Cough: Secondary | ICD-10-CM | POA: Insufficient documentation

## 2018-04-14 DIAGNOSIS — R059 Cough, unspecified: Secondary | ICD-10-CM

## 2018-04-14 DIAGNOSIS — R197 Diarrhea, unspecified: Secondary | ICD-10-CM | POA: Diagnosis not present

## 2018-04-14 LAB — URINALYSIS, ROUTINE W REFLEX MICROSCOPIC
Bacteria, UA: NONE SEEN
Bilirubin Urine: NEGATIVE
Glucose, UA: NEGATIVE mg/dL
Hgb urine dipstick: NEGATIVE
Ketones, ur: 20 mg/dL — AB
Nitrite: NEGATIVE
Protein, ur: NEGATIVE mg/dL
Specific Gravity, Urine: 1.008 (ref 1.005–1.030)
pH: 5 (ref 5.0–8.0)

## 2018-04-14 LAB — COMPREHENSIVE METABOLIC PANEL
ALT: 33 U/L (ref 0–44)
AST: 35 U/L (ref 15–41)
Albumin: 3.9 g/dL (ref 3.5–5.0)
Alkaline Phosphatase: 79 U/L (ref 38–126)
Anion gap: 9 (ref 5–15)
BUN: 9 mg/dL (ref 6–20)
CO2: 25 mmol/L (ref 22–32)
Calcium: 9.7 mg/dL (ref 8.9–10.3)
Chloride: 108 mmol/L (ref 98–111)
Creatinine, Ser: 0.94 mg/dL (ref 0.44–1.00)
GFR calc Af Amer: >60 mL/min (ref >60)
GFR calc non Af Amer: >60 mL/min (ref >60)
Glucose, Bld: 96 mg/dL (ref 70–99)
Potassium: 3.6 mmol/L (ref 3.5–5.1)
Sodium: 142 mmol/L (ref 135–145)
Total Bilirubin: 1 mg/dL (ref 0.3–1.2)
Total Protein: 7.4 g/dL (ref 6.5–8.1)

## 2018-04-14 LAB — RAPID URINE DRUG SCREEN, HOSP PERFORMED
AMPHETAMINES: NOT DETECTED
Benzodiazepines: NOT DETECTED
Cocaine: NOT DETECTED
OPIATES: NOT DETECTED
Tetrahydrocannabinol: NOT DETECTED

## 2018-04-14 LAB — SALICYLATE LEVEL: Salicylate Lvl: <7 mg/dL (ref 2.8–30.0)

## 2018-04-14 LAB — CBC WITH DIFFERENTIAL/PLATELET
Abs Immature Granulocytes: 0 10*3/uL (ref 0.0–0.1)
Basophils Absolute: 0 10*3/uL (ref 0.0–0.1)
Basophils Relative: 1 %
Eosinophils Absolute: 0.1 10*3/uL (ref 0.0–0.7)
Eosinophils Relative: 2 %
HCT: 38.5 % (ref 36.0–46.0)
Hemoglobin: 12.9 g/dL (ref 12.0–15.0)
Immature Granulocytes: 0 %
Lymphocytes Relative: 31 %
Lymphs Abs: 2.1 10*3/uL (ref 0.7–4.0)
MCH: 31.2 pg (ref 26.0–34.0)
MCHC: 33.5 g/dL (ref 30.0–36.0)
MCV: 93.2 fL (ref 78.0–100.0)
Monocytes Absolute: 0.5 10*3/uL (ref 0.1–1.0)
Monocytes Relative: 8 %
Neutro Abs: 3.8 10*3/uL (ref 1.7–7.7)
Neutrophils Relative %: 58 %
Platelets: 319 10*3/uL (ref 150–400)
RBC: 4.13 MIL/uL (ref 3.87–5.11)
RDW: 12.5 % (ref 11.5–15.5)
WBC: 6.6 10*3/uL (ref 4.0–10.5)

## 2018-04-14 LAB — ACETAMINOPHEN LEVEL: Acetaminophen (Tylenol), Serum: <10 ug/mL — ABNORMAL LOW (ref 10–30)

## 2018-04-14 LAB — I-STAT BETA HCG BLOOD, ED (MC, WL, AP ONLY): I-stat hCG, quantitative: <5 m[IU]/mL (ref <5)

## 2018-04-14 NOTE — ED Notes (Signed)
Patient transported to X-ray 

## 2018-04-14 NOTE — Discharge Instructions (Signed)
Please see the information and instructions below regarding your visit.  Your diagnoses today include:  1. Cough     Tests performed today include: See side panel of your discharge paperwork for testing performed today. Vital signs are listed at the bottom of these instructions.   Medications prescribed:    Take any prescribed medications only as prescribed, and any over the counter medications only as directed on the packaging.  Home care instructions:  Please follow any educational materials contained in this packet.   Follow-up instructions: Please follow-up with your primary care provider in one week for further evaluation of your symptoms if they are not completely improved.   Return instructions:  Please return to the Emergency Department if you experience worsening symptoms.  Please return if you have any other emergent concerns.  Additional Information:   Your vital signs today were: BP 130/67    Pulse 78    Temp 98.7 F (37.1 C) (Oral)    Resp 13    Ht 5\' 6"  (1.676 m)    Wt 78.9 kg (174 lb)    SpO2 99%    BMI 28.08 kg/m  If your blood pressure (BP) was elevated on multiple readings during this visit above 130 for the top number or above 80 for the bottom number, please have this repeated by your primary care provider within one month. --------------  Thank you for allowing [place] to participate in your care today.
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
## 2018-04-14 NOTE — ED Notes (Signed)
Pt stable, ambulatory, states understanding of discharge instructions 
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
## 2018-05-08 ENCOUNTER — Other Ambulatory Visit (HOSPITAL_COMMUNITY)
Admission: RE | Admit: 2018-05-08 | Discharge: 2018-05-08 | Disposition: A | Discharge status: Home / Self Care | Payer: Federal, State, Local not specified - PPO | Source: Ambulatory Visit | Attending: Family Medicine | Admitting: Family Medicine

## 2018-05-08 ENCOUNTER — Other Ambulatory Visit: Payer: Self-pay | Admitting: Family Medicine

## 2018-05-08 DIAGNOSIS — Z113 Encounter for screening for infections with a predominantly sexual mode of transmission: Secondary | ICD-10-CM | POA: Diagnosis not present

## 2018-05-08 DIAGNOSIS — E782 Mixed hyperlipidemia: Secondary | ICD-10-CM | POA: Diagnosis not present

## 2018-05-08 DIAGNOSIS — Z124 Encounter for screening for malignant neoplasm of cervix: Secondary | ICD-10-CM | POA: Diagnosis not present

## 2018-05-08 DIAGNOSIS — E010 Iodine-deficiency related diffuse (endemic) goiter: Secondary | ICD-10-CM

## 2018-05-08 DIAGNOSIS — Z1159 Encounter for screening for other viral diseases: Secondary | ICD-10-CM | POA: Diagnosis not present

## 2018-05-08 DIAGNOSIS — Z0000 Encounter for general adult medical examination without abnormal findings: Secondary | ICD-10-CM | POA: Diagnosis not present

## 2018-05-11 LAB — CYTOLOGY - PAP
CHLAMYDIA, DNA PROBE: NEGATIVE
DIAGNOSIS: NEGATIVE
NEISSERIA GONORRHEA: NEGATIVE

## 2018-05-14 ENCOUNTER — Ambulatory Visit
Admission: RE | Admit: 2018-05-14 | Discharge: 2018-05-14 | Disposition: A | Discharge status: Home / Self Care | Payer: Federal, State, Local not specified - PPO | Source: Ambulatory Visit | Attending: Family Medicine | Admitting: Family Medicine

## 2018-05-14 DIAGNOSIS — E010 Iodine-deficiency related diffuse (endemic) goiter: Secondary | ICD-10-CM

## 2018-05-14 DIAGNOSIS — E041 Nontoxic single thyroid nodule: Secondary | ICD-10-CM | POA: Diagnosis not present

## 2018-05-21 DIAGNOSIS — N6489 Other specified disorders of breast: Secondary | ICD-10-CM | POA: Diagnosis not present

## 2018-07-08 DIAGNOSIS — C50919 Malignant neoplasm of unspecified site of unspecified female breast: Secondary | ICD-10-CM | POA: Diagnosis not present

## 2018-07-10 ENCOUNTER — Ambulatory Visit: Payer: Self-pay | Admitting: Plastic Surgery

## 2018-07-14 ENCOUNTER — Other Ambulatory Visit: Payer: Self-pay

## 2018-07-14 ENCOUNTER — Encounter (HOSPITAL_BASED_OUTPATIENT_CLINIC_OR_DEPARTMENT_OTHER): Payer: Self-pay | Admitting: *Deleted

## 2018-07-23 ENCOUNTER — Ambulatory Visit (HOSPITAL_BASED_OUTPATIENT_CLINIC_OR_DEPARTMENT_OTHER)
Admission: RE | Admit: 2018-07-23 | Discharge: 2018-07-23 | Disposition: A | Discharge status: Home / Self Care | Payer: Federal, State, Local not specified - PPO | Source: Ambulatory Visit | Attending: Plastic Surgery | Admitting: Plastic Surgery

## 2018-07-23 ENCOUNTER — Encounter (HOSPITAL_BASED_OUTPATIENT_CLINIC_OR_DEPARTMENT_OTHER)
Admission: RE | Disposition: A | Discharge status: Home / Self Care | Payer: Self-pay | Source: Ambulatory Visit | Attending: Plastic Surgery

## 2018-07-23 ENCOUNTER — Ambulatory Visit (HOSPITAL_BASED_OUTPATIENT_CLINIC_OR_DEPARTMENT_OTHER): Payer: Federal, State, Local not specified - PPO | Admitting: Anesthesiology

## 2018-07-23 ENCOUNTER — Other Ambulatory Visit: Payer: Self-pay

## 2018-07-23 ENCOUNTER — Encounter (HOSPITAL_BASED_OUTPATIENT_CLINIC_OR_DEPARTMENT_OTHER): Payer: Self-pay

## 2018-07-23 DIAGNOSIS — Z853 Personal history of malignant neoplasm of breast: Secondary | ICD-10-CM | POA: Insufficient documentation

## 2018-07-23 DIAGNOSIS — K219 Gastro-esophageal reflux disease without esophagitis: Secondary | ICD-10-CM | POA: Diagnosis not present

## 2018-07-23 DIAGNOSIS — C50911 Malignant neoplasm of unspecified site of right female breast: Secondary | ICD-10-CM | POA: Diagnosis not present

## 2018-07-23 DIAGNOSIS — C50919 Malignant neoplasm of unspecified site of unspecified female breast: Secondary | ICD-10-CM | POA: Diagnosis not present

## 2018-07-23 DIAGNOSIS — Z79899 Other long term (current) drug therapy: Secondary | ICD-10-CM | POA: Diagnosis not present

## 2018-07-23 DIAGNOSIS — N651 Disproportion of reconstructed breast: Principal | ICD-10-CM

## 2018-07-23 DIAGNOSIS — Z87891 Personal history of nicotine dependence: Secondary | ICD-10-CM | POA: Insufficient documentation

## 2018-07-23 DIAGNOSIS — Z923 Personal history of irradiation: Secondary | ICD-10-CM | POA: Diagnosis not present

## 2018-07-23 DIAGNOSIS — N6489 Other specified disorders of breast: Secondary | ICD-10-CM | POA: Insufficient documentation

## 2018-07-23 DIAGNOSIS — Z91040 Latex allergy status: Secondary | ICD-10-CM | POA: Diagnosis not present

## 2018-07-23 DIAGNOSIS — Z9221 Personal history of antineoplastic chemotherapy: Secondary | ICD-10-CM | POA: Insufficient documentation

## 2018-07-23 HISTORY — PX: BREAST RECONSTRUCTION: SHX9

## 2018-07-23 HISTORY — PX: LIPOSUCTION WITH LIPOFILLING: SHX6436

## 2018-07-23 SURGERY — RECONSTRUCTION, BREAST
Anesthesia: General | Site: Breast | Laterality: Right

## 2018-07-23 MED ORDER — FENTANYL CITRATE (PF) 100 MCG/2ML IJ SOLN: 25.0000 ug | INTRAMUSCULAR | Status: DC | PRN

## 2018-07-23 MED ORDER — DEXAMETHASONE SODIUM PHOSPHATE 10 MG/ML IJ SOLN
INTRAMUSCULAR | Status: DC | PRN
  Administered 2018-07-23: 10 mg via INTRAVENOUS

## 2018-07-23 MED ORDER — FENTANYL CITRATE (PF) 100 MCG/2ML IJ SOLN: 50.0000 ug | INTRAMUSCULAR | Status: DC | PRN

## 2018-07-23 MED ORDER — ONDANSETRON HCL 4 MG/2ML IJ SOLN
INTRAMUSCULAR | Status: AC
  Filled 2018-07-23: qty 2

## 2018-07-23 MED ORDER — BACITRACIN ZINC 500 UNIT/GM EX OINT
TOPICAL_OINTMENT | CUTANEOUS | Status: AC
  Filled 2018-07-23: qty 28.35

## 2018-07-23 MED ORDER — ACETAMINOPHEN 160 MG/5ML PO SOLN: 325.0000 mg | ORAL | Status: DC | PRN

## 2018-07-23 MED ORDER — PROPOFOL 10 MG/ML IV BOLUS
INTRAVENOUS | Status: DC | PRN
  Administered 2018-07-23: 150 mg via INTRAVENOUS

## 2018-07-23 MED ORDER — MEPERIDINE HCL 25 MG/ML IJ SOLN: 6.2500 mg | INTRAMUSCULAR | Status: DC | PRN

## 2018-07-23 MED ORDER — SODIUM CHLORIDE 0.9 % IJ SOLN
INTRAMUSCULAR | Status: AC
  Filled 2018-07-23: qty 20

## 2018-07-23 MED ORDER — FENTANYL CITRATE (PF) 100 MCG/2ML IJ SOLN
INTRAMUSCULAR | Status: AC
  Filled 2018-07-23: qty 2

## 2018-07-23 MED ORDER — LACTATED RINGERS IV SOLN
INTRAVENOUS | Status: DC
  Administered 2018-07-23 (×3): via INTRAVENOUS

## 2018-07-23 MED ORDER — LIDOCAINE-EPINEPHRINE 1 %-1:100000 IJ SOLN
INTRAMUSCULAR | Status: AC
  Filled 2018-07-23: qty 2

## 2018-07-23 MED ORDER — CEFAZOLIN SODIUM-DEXTROSE 2-4 GM/100ML-% IV SOLN
INTRAVENOUS | Status: AC
  Filled 2018-07-23: qty 100

## 2018-07-23 MED ORDER — CEFAZOLIN SODIUM-DEXTROSE 2-4 GM/100ML-% IV SOLN
2.0000 g | INTRAVENOUS | Status: AC
  Administered 2018-07-23: 2 g via INTRAVENOUS

## 2018-07-23 MED ORDER — OXYCODONE HCL 5 MG/5ML PO SOLN: 5.0000 mg | Freq: Once | ORAL | Status: DC | PRN

## 2018-07-23 MED ORDER — MIDAZOLAM HCL 2 MG/2ML IJ SOLN
INTRAMUSCULAR | Status: DC | PRN
  Administered 2018-07-23: 2 mg via INTRAVENOUS

## 2018-07-23 MED ORDER — MIDAZOLAM HCL 2 MG/2ML IJ SOLN: 1.0000 mg | INTRAMUSCULAR | Status: DC | PRN

## 2018-07-23 MED ORDER — FENTANYL CITRATE (PF) 100 MCG/2ML IJ SOLN
INTRAMUSCULAR | Status: DC | PRN
  Administered 2018-07-23 (×3): 50 ug via INTRAVENOUS

## 2018-07-23 MED ORDER — OXYCODONE HCL 5 MG PO TABS: 5.0000 mg | ORAL_TABLET | Freq: Once | ORAL | Status: DC | PRN

## 2018-07-23 MED ORDER — EPINEPHRINE PF 1 MG/ML IJ SOLN
INTRAMUSCULAR | Status: DC | PRN
  Administered 2018-07-23: 2 mL

## 2018-07-23 MED ORDER — LIDOCAINE HCL (CARDIAC) PF 100 MG/5ML IV SOSY
PREFILLED_SYRINGE | INTRAVENOUS | Status: DC | PRN
  Administered 2018-07-23: 60 mg via INTRAVENOUS

## 2018-07-23 MED ORDER — BUPIVACAINE HCL (PF) 0.25 % IJ SOLN
INTRAMUSCULAR | Status: AC
  Filled 2018-07-23: qty 30

## 2018-07-23 MED ORDER — MIDAZOLAM HCL 2 MG/2ML IJ SOLN
INTRAMUSCULAR | Status: AC
  Filled 2018-07-23: qty 2

## 2018-07-23 MED ORDER — ACETAMINOPHEN 325 MG PO TABS: 325.0000 mg | ORAL_TABLET | ORAL | Status: DC | PRN

## 2018-07-23 MED ORDER — LIDOCAINE-EPINEPHRINE 1 %-1:100000 IJ SOLN
INTRAMUSCULAR | Status: AC
  Filled 2018-07-23: qty 1

## 2018-07-23 MED ORDER — ONDANSETRON HCL 4 MG/2ML IJ SOLN
4.0000 mg | Freq: Once | INTRAMUSCULAR | Status: AC | PRN
  Administered 2018-07-23: 4 mg via INTRAVENOUS

## 2018-07-23 MED ORDER — CHLORHEXIDINE GLUCONATE CLOTH 2 % EX PADS: 6.0000 | MEDICATED_PAD | Freq: Once | CUTANEOUS | Status: DC

## 2018-07-23 MED ORDER — SCOPOLAMINE 1 MG/3DAYS TD PT72: 1.0000 | MEDICATED_PATCH | Freq: Once | TRANSDERMAL | Status: DC | PRN

## 2018-07-23 MED ORDER — EPHEDRINE SULFATE 50 MG/ML IJ SOLN
INTRAMUSCULAR | Status: DC | PRN
  Administered 2018-07-23 (×8): 10 mg via INTRAVENOUS

## 2018-07-23 MED ORDER — DEXAMETHASONE SODIUM PHOSPHATE 10 MG/ML IJ SOLN
INTRAMUSCULAR | Status: AC
  Filled 2018-07-23: qty 1

## 2018-07-23 MED ORDER — BUPIVACAINE LIPOSOME 1.3 % IJ SUSP
INTRAMUSCULAR | Status: AC
  Filled 2018-07-23: qty 20

## 2018-07-23 MED ORDER — SODIUM CHLORIDE 0.9 % IV SOLN
INTRAVENOUS | Status: DC | PRN
  Administered 2018-07-23: 2000 mL

## 2018-07-23 SURGICAL SUPPLY — 89 items
APL SKNCLS STERI-STRIP NONHPOA (GAUZE/BANDAGES/DRESSINGS) ×4
BAG DECANTER FOR FLEXI CONT (MISCELLANEOUS) ×3 IMPLANT
BENZOIN TINCTURE PRP APPL 2/3 (GAUZE/BANDAGES/DRESSINGS) ×6 IMPLANT
BINDER ABDOMINAL  9 SM 30-45 (SOFTGOODS)
BINDER ABDOMINAL 10 UNV 27-48 (MISCELLANEOUS) IMPLANT
BINDER ABDOMINAL 12 SM 30-45 (SOFTGOODS) IMPLANT
BINDER ABDOMINAL 9 SM 30-45 (SOFTGOODS) IMPLANT
BLADE HEX COATED 2.75 (ELECTRODE) ×3 IMPLANT
BLADE KNIFE PERSONA 10 (BLADE) IMPLANT
BLADE KNIFE PERSONA 15 (BLADE) ×6 IMPLANT
BNDG GAUZE ELAST 4 BULKY (GAUZE/BANDAGES/DRESSINGS) ×6 IMPLANT
CANISTER LIPO FAT HARVEST (MISCELLANEOUS) IMPLANT
CANISTER SUCT 1200ML W/VALVE (MISCELLANEOUS) ×3 IMPLANT
CANNULA LIPO 3.0X8 (CANNULA) ×1 IMPLANT
CAP BOUFFANT 24 BLUE NURSES (PROTECTIVE WEAR) ×3 IMPLANT
COVER BACK TABLE 60X90IN (DRAPES) ×3 IMPLANT
COVER MAYO STAND STRL (DRAPES) ×6 IMPLANT
COVER WAND RF STERILE (DRAPES) IMPLANT
DECANTER SPIKE VIAL GLASS SM (MISCELLANEOUS) ×6 IMPLANT
DRAIN CHANNEL 10F 3/8 F FF (DRAIN) IMPLANT
DRAIN CHANNEL 10M FLAT 3/4 FLT (DRAIN) IMPLANT
DRAIN CHANNEL 7F 3/4 FLAT (WOUND CARE) IMPLANT
DRAPE LAPAROSCOPIC ABDOMINAL (DRAPES) ×2 IMPLANT
DRAPE U-SHAPE 76X120 STRL (DRAPES) ×6 IMPLANT
DRSG EMULSION OIL 3X3 NADH (GAUZE/BANDAGES/DRESSINGS) IMPLANT
DRSG PAD ABDOMINAL 8X10 ST (GAUZE/BANDAGES/DRESSINGS) ×2 IMPLANT
DRSG TELFA 3X8 NADH (GAUZE/BANDAGES/DRESSINGS) IMPLANT
ELECT REM PT RETURN 9FT ADLT (ELECTROSURGICAL) ×3
ELECTRODE REM PT RTRN 9FT ADLT (ELECTROSURGICAL) ×2 IMPLANT
EVACUATOR SILICONE 100CC (DRAIN) IMPLANT
FILTER 7/8 IN (FILTER) ×3 IMPLANT
GAUZE SPONGE 4X4 12PLY STRL (GAUZE/BANDAGES/DRESSINGS) ×6 IMPLANT
GLOVE BIOGEL PI IND STRL 6.5 (GLOVE) IMPLANT
GLOVE BIOGEL PI INDICATOR 6.5 (GLOVE)
GLOVE SURG SS PI 6.5 STRL IVOR (GLOVE) ×3 IMPLANT
GLOVE SURG SS PI 7.0 STRL IVOR (GLOVE) ×3 IMPLANT
GOWN STRL REUS W/ TWL LRG LVL3 (GOWN DISPOSABLE) ×4 IMPLANT
GOWN STRL REUS W/TWL LRG LVL3 (GOWN DISPOSABLE) ×6
IV CONNECTOR CLAVE PORT MALE (IV SETS) ×3 IMPLANT
IV NS 500ML (IV SOLUTION)
IV NS 500ML BAXH (IV SOLUTION) IMPLANT
LINER CANISTER 1000CC FLEX (MISCELLANEOUS) ×6 IMPLANT
MANIFOLD NEPTUNE II (INSTRUMENTS) IMPLANT
MARKER SKIN DUAL TIP RULER LAB (MISCELLANEOUS) ×3 IMPLANT
NDL HYPO 25X1 1.5 SAFETY (NEEDLE) ×4 IMPLANT
NDL SAFETY ECLIPSE 18X1.5 (NEEDLE) IMPLANT
NDL SPNL 18GX3.5 QUINCKE PK (NEEDLE) ×2 IMPLANT
NEEDLE HYPO 18GX1.5 SHARP (NEEDLE)
NEEDLE HYPO 25X1 1.5 SAFETY (NEEDLE) ×6 IMPLANT
NEEDLE SPNL 18GX3.5 QUINCKE PK (NEEDLE) ×3 IMPLANT
NS IRRIG 1000ML POUR BTL (IV SOLUTION) ×6 IMPLANT
PACK BASIN DAY SURGERY FS (CUSTOM PROCEDURE TRAY) ×3 IMPLANT
PAD DRESSING TELFA 3X8 NADH (GAUZE/BANDAGES/DRESSINGS) IMPLANT
PENCIL BUTTON HOLSTER BLD 10FT (ELECTRODE) ×2 IMPLANT
PIN SAFETY STERILE (MISCELLANEOUS) IMPLANT
SCRUB TECHNI CARE 4 OZ NO DYE (MISCELLANEOUS) ×6 IMPLANT
SHEET MEDIUM DRAPE 40X70 STRL (DRAPES) ×6 IMPLANT
SLEEVE SCD COMPRESS KNEE MED (MISCELLANEOUS) ×3 IMPLANT
SPECIMEN JAR MEDIUM (MISCELLANEOUS) ×9 IMPLANT
SPECIMEN JAR X LARGE (MISCELLANEOUS) IMPLANT
SPONGE LAP 18X18 RF (DISPOSABLE) ×9 IMPLANT
STAPLER VISISTAT 35W (STAPLE) ×3 IMPLANT
STRIP CLOSURE SKIN 1/2X4 (GAUZE/BANDAGES/DRESSINGS) ×6 IMPLANT
SUT ETHIBOND 3-0 V-5 (SUTURE) IMPLANT
SUT ETHILON 3 0 PS 1 (SUTURE) ×3 IMPLANT
SUT MERSILENE 2.0 SH NDLE (SUTURE) IMPLANT
SUT MNCRL AB 3-0 PS2 18 (SUTURE) ×6 IMPLANT
SUT MNCRL AB 4-0 PS2 18 (SUTURE) ×6 IMPLANT
SUT MON AB 5-0 PS2 18 (SUTURE) IMPLANT
SUT PROLENE 3 0 PS 1 (SUTURE) IMPLANT
SUT VLOC 90 P-14 23 (SUTURE) IMPLANT
SYR 20CC LL (SYRINGE) ×6 IMPLANT
SYR 50ML LL SCALE MARK (SYRINGE) ×13 IMPLANT
SYR BULB IRRIGATION 50ML (SYRINGE) ×6 IMPLANT
SYR CONTROL 10ML LL (SYRINGE) ×8 IMPLANT
SYR TB 1ML LL NO SAFETY (SYRINGE) IMPLANT
TAPE MEASURE VINYL STERILE (MISCELLANEOUS) ×3 IMPLANT
TOWEL GREEN STERILE FF (TOWEL DISPOSABLE) ×9 IMPLANT
TOWEL OR NON WOVEN STRL DISP B (DISPOSABLE) ×3 IMPLANT
TRAY DSU PREP LF (CUSTOM PROCEDURE TRAY) ×6 IMPLANT
TRAY FOL W/BAG SLVR 16FR STRL (SET/KITS/TRAYS/PACK) IMPLANT
TRAY FOLEY W/BAG SLVR 14FR LF (SET/KITS/TRAYS/PACK) IMPLANT
TRAY FOLEY W/BAG SLVR 16FR LF (SET/KITS/TRAYS/PACK)
TUBE CONNECTING 20X1/4 (TUBING) ×3 IMPLANT
TUBING INFILTRATION IT-10001 (TUBING) IMPLANT
TUBING SET GRADUATE ASPIR 12FT (MISCELLANEOUS) ×3 IMPLANT
UNDERPAD 30X30 (UNDERPADS AND DIAPERS) ×6 IMPLANT
VAC PENCILS W/TUBING CLEAR (MISCELLANEOUS) ×3 IMPLANT
YANKAUER SUCT BULB TIP NO VENT (SUCTIONS) ×3 IMPLANT

## 2018-07-23 NOTE — Anesthesia Preprocedure Evaluation (Addendum)
Anesthesia Evaluation  Patient identified by MRN, date of birth, ID band Patient awake    Reviewed: Allergy & Precautions, H&P , NPO status , Patient's Chart, lab work & pertinent test results  History of Anesthesia Complications (+) PONV and history of anesthetic complications  Airway Mallampati: II  TM Distance: >3 FB Neck ROM: full    Dental no notable dental hx. (+) Teeth Intact   Pulmonary former smoker,    Pulmonary exam normal breath sounds clear to auscultation       Cardiovascular negative cardio ROS Normal cardiovascular exam Rhythm:regular Rate:Normal     Neuro/Psych negative neurological ROS     GI/Hepatic Neg liver ROS, GERD  ,  Endo/Other  negative endocrine ROS  Renal/GU negative Renal ROS     Musculoskeletal   Abdominal   Peds  Hematology negative hematology ROS (+)   Anesthesia Other Findings   Reproductive/Obstetrics negative OB ROS                            Anesthesia Physical  Anesthesia Plan  ASA: II  Anesthesia Plan: General   Post-op Pain Management:    Induction: Intravenous  PONV Risk Score and Plan: 3 and Ondansetron, Treatment may vary due to age or medical condition, Scopolamine patch - Pre-op, Dexamethasone and Midazolam  Airway Management Planned: Oral ETT and LMA  Additional Equipment:   Intra-op Plan:   Post-operative Plan: Extubation in OR  Informed Consent: I have reviewed the patients History and Physical, chart, labs and discussed the procedure including the risks, benefits and alternatives for the proposed anesthesia with the patient or authorized representative who has indicated his/her understanding and acceptance.   Dental Advisory Given  Plan Discussed with: Anesthesiologist, CRNA and Surgeon  Anesthesia Plan Comments: (  )        Anesthesia Quick Evaluation

## 2018-07-23 NOTE — Transfer of Care (Signed)
Immediate Anesthesia Transfer of Care Note  Patient: [name]  Procedure(s) Performed: LEFT BREAST RECONSTRUCTION (Left Breast) LIPOSUCTION AND FAT GRAFTING (Left Abdomen)  Patient Location: PACU  Anesthesia Type:General  Level of Consciousness: awake, alert  and oriented  Airway & Oxygen Therapy: Patient Spontanous Breathing and Patient connected to face mask oxygen  Post-op Assessment: Report given to RN and Post -op Vital signs reviewed and stable  Post vital signs: Reviewed and stable  Last Vitals:  Vitals Value Taken Time  BP    Temp    Pulse    Resp    SpO2      Last Pain:  Vitals:   07/23/18 1120  TempSrc: Oral  PainSc: 0-No pain         Complications: No apparent anesthesia complications

## 2018-07-23 NOTE — Brief Op Note (Signed)
07/23/2018  3:22 PM  PATIENT:  [name]  57 y.o. female  PRE-OPERATIVE DIAGNOSIS: 1) Right Breast Cancer and 2) Breast Asymmetry  POST-OPERATIVE DIAGNOSIS: 1) Right Breast Cancer and 2) Breast Asymmetry  PROCEDURE:  1) Liposuction of the Abdomen and 2) Right Breast Reconstruction with Microfat Grafting  SURGEON:  Surgeon(s) and Role:    * Beecher Furio, Audrea Muscat, MD - Primary  ANESTHESIA:   general  EBL:  50 cc   BLOOD ADMINISTERED:none  DRAINS: none   LOCAL MEDICATIONS USED:  1.3% Exparel (total 266 mgs.)  SPECIMEN:  No Specimen  DISPOSITION OF SPECIMEN:  N/A  COUNTS:  YES  DICTATION: .Other Dictation: Dictation Number [id number]  PLAN OF CARE: Discharge to home after PACU  PATIENT DISPOSITION:  PACU - hemodynamically stable.   Delay start of Pharmacological VTE agent (>24hrs) due to surgical blood loss or risk of bleeding: N/A

## 2018-07-23 NOTE — Discharge Instructions (Signed)
1. No lifting greater than 5 lbs with arms for 4 weeks. 2. Percocet 5/325 mg tabs 1-2 tabs po q 4-6 hours prn pain- prescription given in office. 3. Keflex 500 mgf 1 tab po four times a day- prescription given in office. 4. Sterapred dose pack as directed- prescription given in office. 5. Follow-up appointment Friday in office.     Post Anesthesia Home Care Instructions  Activity: Get plenty of rest for the remainder of the day. A responsible individual must stay with you for 24 hours following the procedure.  For the next 24 hours, DO NOT: -Drive a car -Paediatric nurse -Drink alcoholic beverages -Take any medication unless instructed by your physician -Make any legal decisions or sign important papers.  Meals: Start with liquid foods such as gelatin or soup. Progress to regular foods as tolerated. Avoid greasy, spicy, heavy foods. If nausea and/or vomiting occur, drink only clear liquids until the nausea and/or vomiting subsides. Call your physician if vomiting continues.  Special Instructions/Symptoms: Your throat may feel dry or sore from the anesthesia or the breathing tube placed in your throat during surgery. If this causes discomfort, gargle with warm salt water. The discomfort should disappear within 24 hours.  If you had a scopolamine patch placed behind your ear for the management of post- operative nausea and/or vomiting:  1. The medication in the patch is effective for 72 hours, after which it should be removed.  Wrap patch in a tissue and discard in the trash. Wash hands thoroughly with soap and water. 2. You may remove the patch earlier than 72 hours if you experience unpleasant side effects which may include dry mouth, dizziness or visual disturbances. 3. Avoid touching the patch. Wash your hands with soap and water after contact with the patch.   Call your surgeon if you experience:   1.  Fever over 101.0. 2.  Inability to urinate. 3.  Nausea and/or vomiting. 4.   Extreme swelling or bruising at the surgical site. 5.  Continued bleeding from the incision. 6.  Increased pain, redness or drainage from the incision. 7.  Problems related to your pain medication. 8.  Any problems and/or concernsCall your surgeon if you experience:

## 2018-07-23 NOTE — Anesthesia Procedure Notes (Signed)
Procedure Name: LMA Insertion Date/Time: 07/23/2018 12:29 PM Performed by: Verita Lamb, CRNA Pre-anesthesia Checklist: Patient identified, Emergency Drugs available, Suction available and Patient being monitored Patient Re-evaluated:Patient Re-evaluated prior to induction Oxygen Delivery Method: Circle system utilized Preoxygenation: Pre-oxygenation with 100% oxygen Induction Type: IV induction Ventilation: Mask ventilation without difficulty LMA: LMA inserted LMA Size: 4.0 Number of attempts: 1 Airway Equipment and Method: Bite block Placement Confirmation: positive ETCO2 Tube secured with: Tape Dental Injury: Teeth and Oropharynx as per pre-operative assessment

## 2018-07-23 NOTE — H&P (Signed)
  H&P faxed to surgical center.  -History and Physical Reviewed  -Patient has been re-examined  -No change in the plan of care  [name],[name]    

## 2018-07-23 NOTE — Anesthesia Postprocedure Evaluation (Signed)
Anesthesia Post Note  Patient: [name]  Procedure(s) Performed: LEFT BREAST RECONSTRUCTION (Left Breast) LIPOSUCTION AND FAT GRAFTING (Left Abdomen)     Patient location during evaluation: PACU Anesthesia Type: General Level of consciousness: awake and alert Pain management: pain level controlled Vital Signs Assessment: post-procedure vital signs reviewed and stable Respiratory status: spontaneous breathing, nonlabored ventilation, respiratory function stable and patient connected to nasal cannula oxygen Cardiovascular status: blood pressure returned to baseline and stable Postop Assessment: no apparent nausea or vomiting Anesthetic complications: no    Last Vitals:  Vitals:   07/23/18 1540 07/23/18 1545  BP:  135/74  Pulse: 74 76  Resp: 17 16  Temp:  36.4 C  SpO2: 99% 100%    Last Pain:  Vitals:   07/23/18 1545  TempSrc:   PainSc: 5                  Emile Kyllo

## 2018-07-24 ENCOUNTER — Encounter (HOSPITAL_BASED_OUTPATIENT_CLINIC_OR_DEPARTMENT_OTHER): Payer: Self-pay | Admitting: Plastic Surgery

## 2018-08-30 DIAGNOSIS — N6489 Other specified disorders of breast: Secondary | ICD-10-CM

## 2018-08-30 HISTORY — DX: Other specified disorders of breast: N64.89

## 2018-09-09 ENCOUNTER — Ambulatory Visit: Payer: Self-pay | Admitting: Plastic Surgery

## 2018-09-10 NOTE — Op Note (Signed)
NAME: [name], [name] MEDICAL RECORD [id number] ACCOUNT [id number] DATE OF BIRTH:Mar 20, 1961 FACILITY: [facility] LOCATION: [facility] PHYSICIAN:Benjimin Hadden Jennye Boroughs, MD  OPERATIVE REPORT  DATE OF PROCEDURE:  07/23/2018  PREOPERATIVE DIAGNOSES:   1.  History of right breast cancer status post right lumpectomy. 2.  Breast asymmetry. 3.  Desire for fuller right breast.    POSTOPERATIVE DIAGNOSES:   1.  History of right breast cancer status post right lumpectomy. 2.  Breast asymmetry. 3.  Desire for fuller right breast.    PROCEDURE:   1.  Right breast reconstruction with fat grafting. 2.  Liposuction of the abdomen.  ATTENDING SURGEON:  Hetty Blend, MD.  ANESTHESIA:  General.  COMPLICATIONS:  None.  INDICATIONS:  The patient is a 57 year old Caucasian female who has a history of right breast cancer.  She has been treated with a right lumpectomy followed by radiation therapy.  As a result, she has a significant asymmetry between her breasts.  Due to  the history of radiation therapy, we would like to proceed with a right breast reconstruction using micro fat grafting.  It will likely take several fat graftings in order to give her a large enough volume.  We will plan to perform liposuction of the  abdomen as well to obtain the fat for the grafting.    DESCRIPTION OF PROCEDURE:  The patient was marked in the preop holding area for both the liposuction of the abdomen as well as the fat grafting of the right breast.  She was then taken back to the OR, placed on the table in supine position.  After  adequate anesthesia was performed, the patient's breast and abdomen were prepped with Techni-Care and draped in a sterile fashion.  First, tumescent fluid was infused into the abdomen in the amount recorded on the accompanying nursing notes.  After  allowing the tumescent fluid to take effect for about at least 20 minutes, I then performed power-assisted liposuction of the abdomen.  The fat  was then properly processed to proceed with fat grafting.  The fat grafting was then performed of the right  breast in its entirety to increase the entire volume of the breast.  The amount of fat grafting is noted on the accompanying nursing notes for the operative record.  The small incision sites for the liposuction access as well as for the micro fat  grafting were then closed using 3-0 Monocryl in the dermal layer followed by 4-0 Monocryl running intracuticular stitch on the skin.  The incisions were dressed with Steri-Strips.  The patient was placed into the light postoperative support bra for her  breast.  It was apparent after the fat grafting that this did provide an augmentation to the breast volume.  However, it may require more fat grafting in the future in order to give her a better volume and symmetry with the left breast.  Also, at some  point, we will likely need to do a mastopexy on the left breast in order to better match the right breast contour.  An abdominal binder was then placed on the abdomen to complete the intraoperative dressing.  There were no complications.  The patient  tolerated the procedure well.  The final needle and sponge counts were reported to be correct at the end of the procedure.  The patient was then awakened from the general anesthesia and taken to the recovery room in stable condition.  She was also recovered without complications.  The patient and her husband were given proper  postoperative wound care instructions.  She was  then discharged home in the care of her family in stable condition.  Followup appointment will be within a few days in the office.  TN/NUANCE  D:09/10/2018 T:09/10/2018 JOB:[id number]

## 2018-09-16 ENCOUNTER — Encounter (HOSPITAL_BASED_OUTPATIENT_CLINIC_OR_DEPARTMENT_OTHER): Payer: Self-pay | Admitting: *Deleted

## 2018-09-16 ENCOUNTER — Other Ambulatory Visit: Payer: Self-pay

## 2018-09-18 DIAGNOSIS — Z853 Personal history of malignant neoplasm of breast: Secondary | ICD-10-CM | POA: Diagnosis not present

## 2018-09-18 DIAGNOSIS — Z1231 Encounter for screening mammogram for malignant neoplasm of breast: Secondary | ICD-10-CM | POA: Diagnosis not present

## 2018-09-18 DIAGNOSIS — C50919 Malignant neoplasm of unspecified site of unspecified female breast: Secondary | ICD-10-CM | POA: Diagnosis not present

## 2018-09-25 NOTE — Progress Notes (Signed)
When doing pre op planning 09/25/18, it was discovered by the pre op RN that there was no H&P for this pt. As charge nurse for pre op today, I checked Jenny's office as well as both fax machines looking for this H&P, but was not able to locate in any of these areas. I called the office and spoke with Vale Haven and informed her that there was no H&P and that Dr. Nathanial Rancher would need to make sure to bring it with her on Monday or have it faxed sometime before then. She stated that she would have Doreatha Martin fax it by the end of the day.

## 2018-09-28 ENCOUNTER — Ambulatory Visit (HOSPITAL_BASED_OUTPATIENT_CLINIC_OR_DEPARTMENT_OTHER)
Admission: RE | Admit: 2018-09-28 | Discharge: 2018-09-28 | Disposition: A | Discharge status: Home / Self Care | Payer: Federal, State, Local not specified - PPO | Attending: Plastic Surgery | Admitting: Plastic Surgery

## 2018-09-28 ENCOUNTER — Encounter (HOSPITAL_BASED_OUTPATIENT_CLINIC_OR_DEPARTMENT_OTHER)
Admission: RE | Disposition: A | Discharge status: Home / Self Care | Payer: Self-pay | Source: Home / Self Care | Attending: Plastic Surgery

## 2018-09-28 ENCOUNTER — Ambulatory Visit (HOSPITAL_BASED_OUTPATIENT_CLINIC_OR_DEPARTMENT_OTHER): Payer: Federal, State, Local not specified - PPO | Admitting: Certified Registered"

## 2018-09-28 ENCOUNTER — Encounter (HOSPITAL_BASED_OUTPATIENT_CLINIC_OR_DEPARTMENT_OTHER): Payer: Self-pay | Admitting: *Deleted

## 2018-09-28 DIAGNOSIS — Z923 Personal history of irradiation: Secondary | ICD-10-CM | POA: Diagnosis not present

## 2018-09-28 DIAGNOSIS — Z9221 Personal history of antineoplastic chemotherapy: Secondary | ICD-10-CM | POA: Diagnosis not present

## 2018-09-28 DIAGNOSIS — N651 Disproportion of reconstructed breast: Principal | ICD-10-CM

## 2018-09-28 DIAGNOSIS — N6489 Other specified disorders of breast: Secondary | ICD-10-CM | POA: Insufficient documentation

## 2018-09-28 DIAGNOSIS — Z87891 Personal history of nicotine dependence: Secondary | ICD-10-CM | POA: Insufficient documentation

## 2018-09-28 DIAGNOSIS — Z79899 Other long term (current) drug therapy: Secondary | ICD-10-CM | POA: Insufficient documentation

## 2018-09-28 DIAGNOSIS — Z853 Personal history of malignant neoplasm of breast: Secondary | ICD-10-CM | POA: Insufficient documentation

## 2018-09-28 DIAGNOSIS — Z91040 Latex allergy status: Secondary | ICD-10-CM | POA: Insufficient documentation

## 2018-09-28 DIAGNOSIS — C50919 Malignant neoplasm of unspecified site of unspecified female breast: Secondary | ICD-10-CM | POA: Diagnosis not present

## 2018-09-28 HISTORY — PX: LIPOSUCTION WITH LIPOFILLING: SHX6436

## 2018-09-28 HISTORY — DX: Other specified disorders of breast: N64.89

## 2018-09-28 SURGERY — LIPOSUCTION, WITH FAT TRANSFER
Anesthesia: General | Site: Breast | Laterality: Right

## 2018-09-28 MED ORDER — ONDANSETRON HCL 4 MG/2ML IJ SOLN: 4.0000 mg | Freq: Four times a day (QID) | INTRAMUSCULAR | Status: DC | PRN

## 2018-09-28 MED ORDER — MIDAZOLAM HCL 5 MG/5ML IJ SOLN
INTRAMUSCULAR | Status: DC | PRN
  Administered 2018-09-28: 2 mg via INTRAVENOUS

## 2018-09-28 MED ORDER — CEFAZOLIN SODIUM-DEXTROSE 2-4 GM/100ML-% IV SOLN
INTRAVENOUS | Status: AC
  Filled 2018-09-28: qty 100

## 2018-09-28 MED ORDER — DEXAMETHASONE SODIUM PHOSPHATE 4 MG/ML IJ SOLN
INTRAMUSCULAR | Status: DC | PRN
  Administered 2018-09-28: 10 mg via INTRAVENOUS

## 2018-09-28 MED ORDER — EPHEDRINE SULFATE-NACL 50-0.9 MG/10ML-% IV SOSY
PREFILLED_SYRINGE | INTRAVENOUS | Status: DC | PRN
  Administered 2018-09-28 (×9): 10 mg via INTRAVENOUS

## 2018-09-28 MED ORDER — EPINEPHRINE 30 MG/30ML IJ SOLN
INTRAMUSCULAR | Status: AC
  Filled 2018-09-28: qty 1

## 2018-09-28 MED ORDER — ONDANSETRON HCL 4 MG/2ML IJ SOLN
INTRAMUSCULAR | Status: AC
  Filled 2018-09-28: qty 4

## 2018-09-28 MED ORDER — OXYCODONE HCL 5 MG/5ML PO SOLN: 5.0000 mg | Freq: Once | ORAL | Status: DC | PRN

## 2018-09-28 MED ORDER — LIDOCAINE 2% (20 MG/ML) 5 ML SYRINGE
INTRAMUSCULAR | Status: AC
  Filled 2018-09-28: qty 15

## 2018-09-28 MED ORDER — MIDAZOLAM HCL 2 MG/2ML IJ SOLN
INTRAMUSCULAR | Status: AC
  Filled 2018-09-28: qty 2

## 2018-09-28 MED ORDER — MIDAZOLAM HCL 2 MG/2ML IJ SOLN: 1.0000 mg | INTRAMUSCULAR | Status: DC | PRN

## 2018-09-28 MED ORDER — FENTANYL CITRATE (PF) 100 MCG/2ML IJ SOLN
INTRAMUSCULAR | Status: AC
  Filled 2018-09-28: qty 2

## 2018-09-28 MED ORDER — BUPIVACAINE HCL (PF) 0.25 % IJ SOLN
INTRAMUSCULAR | Status: AC
  Filled 2018-09-28: qty 30

## 2018-09-28 MED ORDER — CEFAZOLIN SODIUM-DEXTROSE 2-4 GM/100ML-% IV SOLN
2.0000 g | INTRAVENOUS | Status: AC
  Administered 2018-09-28: 2 g via INTRAVENOUS

## 2018-09-28 MED ORDER — SCOPOLAMINE 1 MG/3DAYS TD PT72
MEDICATED_PATCH | TRANSDERMAL | Status: AC
  Filled 2018-09-28: qty 1

## 2018-09-28 MED ORDER — SCOPOLAMINE 1 MG/3DAYS TD PT72
1.0000 | MEDICATED_PATCH | Freq: Once | TRANSDERMAL | Status: DC | PRN
  Administered 2018-09-28: 1.5 mg via TRANSDERMAL

## 2018-09-28 MED ORDER — EPHEDRINE 5 MG/ML INJ
INTRAVENOUS | Status: AC
  Filled 2018-09-28: qty 10

## 2018-09-28 MED ORDER — CHLORHEXIDINE GLUCONATE CLOTH 2 % EX PADS: 6.0000 | MEDICATED_PAD | Freq: Once | CUTANEOUS | Status: DC

## 2018-09-28 MED ORDER — LIDOCAINE HCL (CARDIAC) PF 100 MG/5ML IV SOSY
PREFILLED_SYRINGE | INTRAVENOUS | Status: DC | PRN
  Administered 2018-09-28: 50 mg via INTRAVENOUS

## 2018-09-28 MED ORDER — FENTANYL CITRATE (PF) 100 MCG/2ML IJ SOLN: 25.0000 ug | INTRAMUSCULAR | Status: DC | PRN

## 2018-09-28 MED ORDER — SODIUM CHLORIDE (PF) 0.9 % IJ SOLN
INTRAMUSCULAR | Status: AC
  Filled 2018-09-28: qty 20

## 2018-09-28 MED ORDER — OXYCODONE HCL 5 MG PO TABS: 5.0000 mg | ORAL_TABLET | Freq: Once | ORAL | Status: DC | PRN

## 2018-09-28 MED ORDER — LIDOCAINE HCL (PF) 1 % IJ SOLN
INTRAMUSCULAR | Status: AC
  Filled 2018-09-28: qty 30

## 2018-09-28 MED ORDER — FENTANYL CITRATE (PF) 100 MCG/2ML IJ SOLN
INTRAMUSCULAR | Status: DC | PRN
  Administered 2018-09-28 (×4): 25 ug via INTRAVENOUS

## 2018-09-28 MED ORDER — LACTATED RINGERS IV SOLN
INTRAVENOUS | Status: DC
  Administered 2018-09-28 (×2): via INTRAVENOUS

## 2018-09-28 MED ORDER — PROPOFOL 10 MG/ML IV BOLUS
INTRAVENOUS | Status: AC
  Filled 2018-09-28: qty 20

## 2018-09-28 MED ORDER — BUPIVACAINE HCL (PF) 0.5 % IJ SOLN
INTRAMUSCULAR | Status: AC
  Filled 2018-09-28: qty 30

## 2018-09-28 MED ORDER — ONDANSETRON HCL 4 MG/2ML IJ SOLN
INTRAMUSCULAR | Status: DC | PRN
  Administered 2018-09-28 (×2): 4 mg via INTRAVENOUS

## 2018-09-28 MED ORDER — PROPOFOL 10 MG/ML IV BOLUS
INTRAVENOUS | Status: DC | PRN
  Administered 2018-09-28: 150 mg via INTRAVENOUS

## 2018-09-28 MED ORDER — PROPOFOL 500 MG/50ML IV EMUL
INTRAVENOUS | Status: AC
  Filled 2018-09-28: qty 100

## 2018-09-28 MED ORDER — DEXAMETHASONE SODIUM PHOSPHATE 10 MG/ML IJ SOLN
INTRAMUSCULAR | Status: AC
  Filled 2018-09-28: qty 2

## 2018-09-28 MED ORDER — BUPIVACAINE-EPINEPHRINE (PF) 0.25% -1:200000 IJ SOLN
INTRAMUSCULAR | Status: AC
  Filled 2018-09-28: qty 30

## 2018-09-28 MED ORDER — PROPOFOL 500 MG/50ML IV EMUL
INTRAVENOUS | Status: DC | PRN
  Administered 2018-09-28: 25 ug/kg/min via INTRAVENOUS

## 2018-09-28 MED ORDER — SODIUM CHLORIDE 0.9 % IV SOLN
INTRAVENOUS | Status: DC | PRN
  Administered 2018-09-28: 1600 mL

## 2018-09-28 MED ORDER — LIDOCAINE-EPINEPHRINE 1 %-1:100000 IJ SOLN
INTRAMUSCULAR | Status: AC
  Filled 2018-09-28: qty 1

## 2018-09-28 MED ORDER — FENTANYL CITRATE (PF) 100 MCG/2ML IJ SOLN: 50.0000 ug | INTRAMUSCULAR | Status: DC | PRN

## 2018-09-28 MED ORDER — LIDOCAINE-EPINEPHRINE 1 %-1:100000 IJ SOLN
INTRAMUSCULAR | Status: DC | PRN
  Administered 2018-09-28: 5 mL

## 2018-09-28 SURGICAL SUPPLY — 84 items
APL SKNCLS STERI-STRIP NONHPOA (GAUZE/BANDAGES/DRESSINGS)
BAG DECANTER FOR FLEXI CONT (MISCELLANEOUS) ×1 IMPLANT
BENZOIN TINCTURE PRP APPL 2/3 (GAUZE/BANDAGES/DRESSINGS) ×1 IMPLANT
BINDER ABDOMINAL  9 SM 30-45 (SOFTGOODS)
BINDER ABDOMINAL 10 UNV 27-48 (MISCELLANEOUS) ×1 IMPLANT
BINDER ABDOMINAL 12 SM 30-45 (SOFTGOODS) IMPLANT
BINDER ABDOMINAL 9 SM 30-45 (SOFTGOODS) IMPLANT
BINDER BREAST XLRG (GAUZE/BANDAGES/DRESSINGS) ×1 IMPLANT
BLADE HEX COATED 2.75 (ELECTRODE) ×2 IMPLANT
BLADE KNIFE PERSONA 10 (BLADE) ×2 IMPLANT
BLADE KNIFE PERSONA 15 (BLADE) ×3 IMPLANT
BNDG GAUZE ELAST 4 BULKY (GAUZE/BANDAGES/DRESSINGS) ×2 IMPLANT
CANISTER LIPO FAT HARVEST (MISCELLANEOUS) ×1 IMPLANT
CANISTER SUCT 1200ML W/VALVE (MISCELLANEOUS) ×1 IMPLANT
CANNULA ASPIRATION 4X30 (CANNULA) ×1 IMPLANT
CAP BOUFFANT 24 BLUE NURSES (PROTECTIVE WEAR) ×2 IMPLANT
COVER BACK TABLE 60X90IN (DRAPES) ×2 IMPLANT
COVER MAYO STAND STRL (DRAPES) ×3 IMPLANT
COVER WAND RF STERILE (DRAPES) IMPLANT
DECANTER SPIKE VIAL GLASS SM (MISCELLANEOUS) ×2 IMPLANT
DRAIN CHANNEL 10F 3/8 F FF (DRAIN) IMPLANT
DRAPE U-SHAPE 76X120 STRL (DRAPES) ×4 IMPLANT
DRSG EMULSION OIL 3X3 NADH (GAUZE/BANDAGES/DRESSINGS) IMPLANT
DRSG PAD ABDOMINAL 8X10 ST (GAUZE/BANDAGES/DRESSINGS) ×2 IMPLANT
ELECT REM PT RETURN 9FT ADLT (ELECTROSURGICAL) ×2
ELECTRODE REM PT RTRN 9FT ADLT (ELECTROSURGICAL) ×1 IMPLANT
EVACUATOR SILICONE 100CC (DRAIN) IMPLANT
GAUZE SPONGE 4X4 12PLY STRL (GAUZE/BANDAGES/DRESSINGS) ×3 IMPLANT
GLOVE BIO SURGEON STRL SZ 6.5 (GLOVE) ×1 IMPLANT
GLOVE BIO SURGEON STRL SZ7 (GLOVE) ×1 IMPLANT
GLOVE BIOGEL PI IND STRL 6.5 (GLOVE) IMPLANT
GLOVE BIOGEL PI IND STRL 7.5 (GLOVE) IMPLANT
GLOVE BIOGEL PI IND STRL 8 (GLOVE) IMPLANT
GLOVE BIOGEL PI INDICATOR 6.5 (GLOVE)
GLOVE BIOGEL PI INDICATOR 7.5 (GLOVE) ×1
GLOVE BIOGEL PI INDICATOR 8 (GLOVE) ×1
GLOVE EXAM NITRILE MD LF STRL (GLOVE) ×2 IMPLANT
GLOVE SURG SS PI 6.5 STRL IVOR (GLOVE) ×1 IMPLANT
GLOVE SURG SS PI 7.0 STRL IVOR (GLOVE) ×3 IMPLANT
GLOVE SURG SS PI 8.0 STRL IVOR (GLOVE) ×1 IMPLANT
GOWN STRL REUS W/ TWL LRG LVL3 (GOWN DISPOSABLE) ×2 IMPLANT
GOWN STRL REUS W/ TWL XL LVL3 (GOWN DISPOSABLE) IMPLANT
GOWN STRL REUS W/TWL LRG LVL3 (GOWN DISPOSABLE) ×2
GOWN STRL REUS W/TWL XL LVL3 (GOWN DISPOSABLE) ×4
LINER CANISTER 1000CC FLEX (MISCELLANEOUS) ×4 IMPLANT
MARKER SKIN DUAL TIP RULER LAB (MISCELLANEOUS) ×2 IMPLANT
NDL HYPO 25X1 1.5 SAFETY (NEEDLE) ×1 IMPLANT
NDL SAFETY ECLIPSE 18X1.5 (NEEDLE) IMPLANT
NDL SPNL 18GX3.5 QUINCKE PK (NEEDLE) ×1 IMPLANT
NEEDLE HYPO 18GX1.5 SHARP (NEEDLE) ×4
NEEDLE HYPO 25X1 1.5 SAFETY (NEEDLE) ×2 IMPLANT
NEEDLE SPNL 18GX3.5 QUINCKE PK (NEEDLE) ×2 IMPLANT
NS IRRIG 1000ML POUR BTL (IV SOLUTION) ×2 IMPLANT
PACK BASIN DAY SURGERY FS (CUSTOM PROCEDURE TRAY) ×2 IMPLANT
PENCIL BUTTON HOLSTER BLD 10FT (ELECTRODE) ×2 IMPLANT
PIN SAFETY STERILE (MISCELLANEOUS) IMPLANT
SCRUB TECHNI CARE 4 OZ NO DYE (MISCELLANEOUS) ×3 IMPLANT
SHEET MEDIUM DRAPE 40X70 STRL (DRAPES) ×4 IMPLANT
SLEEVE SCD COMPRESS KNEE MED (MISCELLANEOUS) ×2 IMPLANT
SPECIMEN JAR MEDIUM (MISCELLANEOUS) ×6 IMPLANT
SPECIMEN JAR X LARGE (MISCELLANEOUS) IMPLANT
SPONGE LAP 18X18 RF (DISPOSABLE) ×3 IMPLANT
STAPLER VISISTAT 35W (STAPLE) ×2 IMPLANT
STRIP CLOSURE SKIN 1/2X4 (GAUZE/BANDAGES/DRESSINGS) ×2 IMPLANT
SUT ETHILON 3 0 PS 1 (SUTURE) ×1 IMPLANT
SUT MERSILENE 2.0 SH NDLE (SUTURE) IMPLANT
SUT MNCRL AB 3-0 PS2 18 (SUTURE) ×3 IMPLANT
SUT MNCRL AB 4-0 PS2 18 (SUTURE) ×3 IMPLANT
SUT MON AB 5-0 PS2 18 (SUTURE) IMPLANT
SUT PROLENE 3 0 PS 1 (SUTURE) IMPLANT
SYR 20CC LL (SYRINGE) ×7 IMPLANT
SYR 50ML LL SCALE MARK (SYRINGE) ×13 IMPLANT
SYR BULB IRRIGATION 50ML (SYRINGE) ×3 IMPLANT
SYR CONTROL 10ML LL (SYRINGE) ×2 IMPLANT
SYR TB 1ML LL NO SAFETY (SYRINGE) ×1 IMPLANT
SYRINGE TOOMEY DISP (SYRINGE) ×1 IMPLANT
TAPE MEASURE VINYL STERILE (MISCELLANEOUS) ×1 IMPLANT
TRAY DSU PREP LF (CUSTOM PROCEDURE TRAY) ×3 IMPLANT
TRAY FOL W/BAG SLVR 16FR STRL (SET/KITS/TRAYS/PACK) IMPLANT
TRAY FOLEY W/BAG SLVR 14FR LF (SET/KITS/TRAYS/PACK) IMPLANT
TRAY FOLEY W/BAG SLVR 16FR LF (SET/KITS/TRAYS/PACK)
TUBING INFILTRATION IT-10001 (TUBING) ×1 IMPLANT
TUBING SET GRADUATE ASPIR 12FT (MISCELLANEOUS) ×2 IMPLANT
UNDERPAD 30X30 (UNDERPADS AND DIAPERS) ×4 IMPLANT

## 2018-09-28 NOTE — Anesthesia Preprocedure Evaluation (Signed)
Anesthesia Evaluation  Patient identified by MRN, date of birth, ID band Patient awake    Reviewed: Allergy & Precautions, H&P , NPO status , Patient's Chart, lab work & pertinent test results  Airway Mallampati: II   Neck ROM: full    Dental   Pulmonary former smoker,    breath sounds clear to auscultation       Cardiovascular negative cardio ROS   Rhythm:regular Rate:Normal     Neuro/Psych  Headaches,    GI/Hepatic   Endo/Other    Renal/GU      Musculoskeletal   Abdominal   Peds  Hematology   Anesthesia Other Findings   Reproductive/Obstetrics H/o breast CA                             Anesthesia Physical Anesthesia Plan  ASA: II  Anesthesia Plan: General   Post-op Pain Management:    Induction: Intravenous  PONV Risk Score and Plan: 3 and Ondansetron, Dexamethasone, Midazolam and Treatment may vary due to age or medical condition  Airway Management Planned: LMA  Additional Equipment:   Intra-op Plan:   Post-operative Plan:   Informed Consent: I have reviewed the patients History and Physical, chart, labs and discussed the procedure including the risks, benefits and alternatives for the proposed anesthesia with the patient or authorized representative who has indicated his/her understanding and acceptance.     Plan Discussed with: CRNA, Anesthesiologist and Surgeon  Anesthesia Plan Comments:         Anesthesia Quick Evaluation

## 2018-09-28 NOTE — Transfer of Care (Signed)
Immediate Anesthesia Transfer of Care Note  Patient: [name]  Procedure(s) Performed: ABDOMEN, HIPS AND FLANKS LIPOSUCTION WITH MICRO FAT GRAFTING TO RIGHT BREAST (Right Breast)  Patient Location: PACU  Anesthesia Type:General  Level of Consciousness: drowsy and patient cooperative  Airway & Oxygen Therapy: Patient Spontanous Breathing and Patient connected to face mask oxygen  Post-op Assessment: Report given to RN and Post -op Vital signs reviewed and stable  Post vital signs: Reviewed and stable  Last Vitals:  Vitals Value Taken Time  BP 129/62 09/28/2018  3:36 PM  Temp    Pulse 84 09/28/2018  3:41 PM  Resp 18 09/28/2018  3:41 PM  SpO2 100 % 09/28/2018  3:41 PM  Vitals shown include unvalidated device data.  Last Pain:  Vitals:   09/28/18 1133  TempSrc: Oral  PainSc: 0-No pain         Complications: No apparent anesthesia complications

## 2018-09-28 NOTE — Anesthesia Procedure Notes (Signed)
Procedure Name: LMA Insertion Date/Time: 09/28/2018 12:55 PM Performed by: Gwyndolyn Saxon, CRNA Pre-anesthesia Checklist: Patient identified, Emergency Drugs available, Suction available and Patient being monitored Patient Re-evaluated:Patient Re-evaluated prior to induction Oxygen Delivery Method: Circle system utilized Preoxygenation: Pre-oxygenation with 100% oxygen Induction Type: IV induction Ventilation: Mask ventilation without difficulty LMA: LMA inserted LMA Size: 4.0 Number of attempts: 1 Placement Confirmation: positive ETCO2 and breath sounds checked- equal and bilateral Tube secured with: Tape Dental Injury: Teeth and Oropharynx as per pre-operative assessment

## 2018-09-28 NOTE — Brief Op Note (Signed)
09/28/2018  3:33 PM  PATIENT:  [name]  57 y.o. female  PRE-OPERATIVE DIAGNOSIS: 1) H/O RIGHT BREAST CANCER 2) BREAST ASYMMETRY DUE TO BREAST CANCER  POST-OPERATIVE DIAGNOSIS: 1) H/O RIGHT BREAST CANCER 2) BREAST ASYMMETRY DUE TO BREAST CANCER  PROCEDURE: 1)  RIGHT BREAST RECONSTRUCTION USING MICROFAT GRAFTING AND 2) LIPOSUCTION OF THE ABDOMEN AND HIPS  SURGEON:  Surgeon(s) and Role:    * Lillionna Nabi, Audrea Muscat, MD - Primary   ANESTHESIA:   general   EBL:  10 mL   BLOOD ADMINISTERED:none  DRAINS: none   LOCAL MEDICATIONS USED:  LIDOCAINE   SPECIMEN:  No Specimen  DISPOSITION OF SPECIMEN:  N/A  COUNTS:  YES  DICTATION: .Other Dictation: Dictation Number [id number]  PLAN OF CARE: Discharge to home after PACU  PATIENT DISPOSITION:  PACU - hemodynamically stable.   Delay start of Pharmacological VTE agent (>24hrs) due to surgical blood loss or risk of bleeding: not applicable

## 2018-09-28 NOTE — H&P (Signed)
  H&P faxed to surgical center.  -History and Physical Reviewed  -Patient has been re-examined  -No change in the plan of care  [name] A    

## 2018-09-28 NOTE — Discharge Instructions (Signed)
1. No lifting greater than 5 lbs with arms for 4 weeks. 2.. Percocet 5/325 mg tabs 1-2 tabs po q 4-6 hours prn pain- prescription given in office. 3. Duricef 1 tab po bid- prescription given in office. 4. Sterapred dose pack as directed- prescription given in office. 5. Follow-up appointment Friday in office.     Post Anesthesia Home Care Instructions  Activity: Get plenty of rest for the remainder of the day. A responsible individual must stay with you for 24 hours following the procedure.  For the next 24 hours, DO NOT: -Drive a car -Paediatric nurse -Drink alcoholic beverages -Take any medication unless instructed by your physician -Make any legal decisions or sign important papers.  Meals: Start with liquid foods such as gelatin or soup. Progress to regular foods as tolerated. Avoid greasy, spicy, heavy foods. If nausea and/or vomiting occur, drink only clear liquids until the nausea and/or vomiting subsides. Call your physician if vomiting continues.  Special Instructions/Symptoms: Your throat may feel dry or sore from the anesthesia or the breathing tube placed in your throat during surgery. If this causes discomfort, gargle with warm salt water. The discomfort should disappear within 24 hours.  If you had a scopolamine patch placed behind your ear for the management of post- operative nausea and/or vomiting:  1. The medication in the patch is effective for 72 hours, after which it should be removed.  Wrap patch in a tissue and discard in the trash. Wash hands thoroughly with soap and water. 2. You may remove the patch earlier than 72 hours if you experience unpleasant side effects which may include dry mouth, dizziness or visual disturbances. 3. Avoid touching the patch. Wash your hands with soap and water after contact with the patch.

## 2018-09-29 ENCOUNTER — Encounter (HOSPITAL_BASED_OUTPATIENT_CLINIC_OR_DEPARTMENT_OTHER): Payer: Self-pay | Admitting: Plastic Surgery

## 2018-09-29 NOTE — Anesthesia Postprocedure Evaluation (Signed)
Anesthesia Post Note  Patient: [name]  Procedure(s) Performed: ABDOMEN, HIPS AND FLANKS LIPOSUCTION WITH MICRO FAT GRAFTING TO RIGHT BREAST (Right Breast)     Patient location during evaluation: PACU Anesthesia Type: General Level of consciousness: awake and alert Pain management: pain level controlled Vital Signs Assessment: post-procedure vital signs reviewed and stable Respiratory status: spontaneous breathing, nonlabored ventilation, respiratory function stable and patient connected to nasal cannula oxygen Cardiovascular status: blood pressure returned to baseline and stable Postop Assessment: no apparent nausea or vomiting Anesthetic complications: no    Last Vitals:  Vitals:   09/28/18 1615 09/28/18 1630  BP: 130/73 123/67  Pulse: 84 87  Resp: 15 18  Temp:  36.4 C  SpO2: 98% 97%    Last Pain:  Vitals:   09/28/18 1630  TempSrc:   PainSc: 0-No pain                 Jacobey Gura S

## 2018-10-28 DIAGNOSIS — E010 Iodine-deficiency related diffuse (endemic) goiter: Secondary | ICD-10-CM | POA: Diagnosis not present

## 2018-10-28 DIAGNOSIS — R5383 Other fatigue: Secondary | ICD-10-CM | POA: Diagnosis not present

## 2018-10-28 DIAGNOSIS — R7303 Prediabetes: Secondary | ICD-10-CM | POA: Diagnosis not present

## 2018-11-17 NOTE — Op Note (Signed)
NAME: [name], [name] MEDICAL RECORD [id number] ACCOUNT [id number] DATE OF BIRTH:[birth year] FACILITY: [facility] LOCATION: [facility] PHYSICIAN:MARY Jennye Boroughs, MD  OPERATIVE REPORT  DATE OF PROCEDURE:  09/28/2018  PREOPERATIVE DIAGNOSES: 1.  History of right breast cancer, status post right lumpectomy. 2.  Breast asymmetry. 3.  Desire for full right breast  the same which.  POSTOPERATIVE DIAGNOSES: 1.  History of right breast cancer status post right lumpectomy. 2.  Breast asymmetry. 3.  Desire for full right breast.  PROCEDURES: 1.  Right breast reconstruction using fat grafting. 2.  Liposuction of the abdomen.  ATTENDING SURGEON:  Hetty Blend, MD  ANESTHESIA:  General.  ANESTHESIOLOGIST:  Dr. Albertha Ghee  COMPLICATIONS:  None.  INDICATIONS FOR THE PROCEDURE PRESENT ILLNESS:  The patient is a 58 year old Caucasian female who has a history of right breast cancer.  She was treated with a right lumpectomy followed by radiation therapy, so as a result, she has a significant  asymmetry between her breast, breast pleura.  Due to the history of radiation therapy, we would like to proceed with a right breast reconstruction using micro fat grafting.  As I had previously discussed, it will take several fat grafting sessions in  order to give her a large volume to the right breast.  We will proceed with another micro fat grafting today.  We will also plan to use the liposuction of the abdomen as a source for the fat grafting.  PROCEDURE:  The patient was marked in the preop holding area for both the liposuction of the abdomen as well as the fat grafting of the right breast.  She was then taken back to the OR, placed on the table in the supine position.  After adequate general  anesthesia was obtained, the patient's breast and abdomen were prepped with Techni-Care and draped in a sterile fashion.  Tumescent fluid was infused into the abdomen in the amount as recorded on the  accompanying nursing notes.  After allowing the  tumescent fluid to take effect for at least 20 minutes, I performed power-assisted liposuction of the abdomen.  Fat was then properly processed to proceed with the micro fat grafting.  The fat grafting was then performed of the right breast in its  entirety to increase the entire volume of the breast.  The total amount of fat grafting performed was 475 mL.  The small incision site for the liposuction access as well as the fat grafting were then closed using 3-0 Monocryl in the dermal layer followed  by 4-0 Monocryl running intracuticular stitch on the skin.  The incisions were dressed with Steri-Strips.  The area around the base of the right breast including chest wall musculature was injected with 0.5% Marcaine with epinephrine to provide  postoperative pain relief.  The patient was then placed into a light postoperative support bra for her breast.  It was apparent that the fat grafting did provide a nice augmentation to the breast volume.  However, again it may require more fat grafting  in the future in order to give her better volume and symmetry with the left breast.  Also, at some point, she will likely need to have a mastopexy performed on the left breast in order to better match the right breast contour.  An abdominal binder was  then placed on the abdomen to complete the intraoperative dressing.  There were no complications.    The patient tolerated the procedure well.  The final needle, sponge counts were recorded to be per  correct at the end of the procedure.  The patient was then awakened from the general anesthesia and taken to the recovery room in stable condition.  She was also recovered without complications.  The patient and her husband were given a proper postoperative wound care instructions.  She was  then discharged home to the care of her husband in stable condition.  Followup appointment will be tomorrow in the office.  AN/NUANCE   D:11/16/2018 T:11/17/2018 JOB:[id number]

## 2019-02-16 DIAGNOSIS — C50919 Malignant neoplasm of unspecified site of unspecified female breast: Secondary | ICD-10-CM | POA: Diagnosis not present

## 2019-02-23 ENCOUNTER — Encounter (HOSPITAL_BASED_OUTPATIENT_CLINIC_OR_DEPARTMENT_OTHER): Payer: Self-pay | Admitting: *Deleted

## 2019-02-23 ENCOUNTER — Other Ambulatory Visit: Payer: Self-pay

## 2019-02-26 ENCOUNTER — Other Ambulatory Visit (HOSPITAL_COMMUNITY)
Admission: RE | Admit: 2019-02-26 | Discharge: 2019-02-26 | Disposition: A | Discharge status: Home / Self Care | Payer: BLUE CROSS/BLUE SHIELD | Source: Ambulatory Visit | Attending: Plastic Surgery | Admitting: Plastic Surgery

## 2019-02-26 DIAGNOSIS — Z1159 Encounter for screening for other viral diseases: Secondary | ICD-10-CM | POA: Insufficient documentation

## 2019-02-27 LAB — NOVEL CORONAVIRUS, NAA (HOSP ORDER, SEND-OUT TO REF LAB; TAT 18-24 HRS): SARS-CoV-2, NAA: NOT DETECTED

## 2019-03-01 ENCOUNTER — Ambulatory Visit: Payer: Self-pay | Admitting: Plastic Surgery

## 2019-03-01 NOTE — Anesthesia Preprocedure Evaluation (Addendum)
Anesthesia Evaluation  Patient identified by MRN, date of birth, ID band Patient awake    Reviewed: Allergy & Precautions, NPO status , Patient's Chart, lab work & pertinent test results  History of Anesthesia Complications (+) PONV  Airway Mallampati: II  TM Distance: >3 FB     Dental   Pulmonary former smoker,    breath sounds clear to auscultation       Cardiovascular negative cardio ROS   Rhythm:Regular Rate:Normal     Neuro/Psych    GI/Hepatic Neg liver ROS, GERD  ,  Endo/Other  negative endocrine ROS  Renal/GU negative Renal ROS     Musculoskeletal   Abdominal   Peds  Hematology   Anesthesia Other Findings   Reproductive/Obstetrics                            Anesthesia Physical Anesthesia Plan  ASA: II  Anesthesia Plan: General   Post-op Pain Management:    Induction: Intravenous  PONV Risk Score and Plan: Ondansetron and Midazolam  Airway Management Planned: Oral ETT  Additional Equipment:   Intra-op Plan:   Post-operative Plan:   Informed Consent: I have reviewed the patients History and Physical, chart, labs and discussed the procedure including the risks, benefits and alternatives for the proposed anesthesia with the patient or authorized representative who has indicated his/her understanding and acceptance.     Dental advisory given  Plan Discussed with: Anesthesiologist and CRNA  Anesthesia Plan Comments:        Anesthesia Quick Evaluation

## 2019-03-02 ENCOUNTER — Ambulatory Visit (HOSPITAL_BASED_OUTPATIENT_CLINIC_OR_DEPARTMENT_OTHER)
Admission: RE | Admit: 2019-03-02 | Discharge: 2019-03-02 | Disposition: A | Discharge status: Home / Self Care | Payer: BLUE CROSS/BLUE SHIELD | Attending: Plastic Surgery | Admitting: Plastic Surgery

## 2019-03-02 ENCOUNTER — Other Ambulatory Visit: Payer: Self-pay

## 2019-03-02 ENCOUNTER — Ambulatory Visit (HOSPITAL_BASED_OUTPATIENT_CLINIC_OR_DEPARTMENT_OTHER): Payer: BLUE CROSS/BLUE SHIELD | Admitting: Anesthesiology

## 2019-03-02 ENCOUNTER — Encounter (HOSPITAL_BASED_OUTPATIENT_CLINIC_OR_DEPARTMENT_OTHER): Payer: Self-pay

## 2019-03-02 ENCOUNTER — Encounter (HOSPITAL_BASED_OUTPATIENT_CLINIC_OR_DEPARTMENT_OTHER)
Admission: RE | Disposition: A | Discharge status: Home / Self Care | Payer: Self-pay | Source: Home / Self Care | Attending: Plastic Surgery

## 2019-03-02 DIAGNOSIS — N6489 Other specified disorders of breast: Secondary | ICD-10-CM | POA: Insufficient documentation

## 2019-03-02 DIAGNOSIS — Z87891 Personal history of nicotine dependence: Secondary | ICD-10-CM | POA: Insufficient documentation

## 2019-03-02 DIAGNOSIS — Z9221 Personal history of antineoplastic chemotherapy: Secondary | ICD-10-CM | POA: Insufficient documentation

## 2019-03-02 DIAGNOSIS — Z923 Personal history of irradiation: Secondary | ICD-10-CM | POA: Diagnosis not present

## 2019-03-02 DIAGNOSIS — K219 Gastro-esophageal reflux disease without esophagitis: Secondary | ICD-10-CM | POA: Insufficient documentation

## 2019-03-02 DIAGNOSIS — Z853 Personal history of malignant neoplasm of breast: Secondary | ICD-10-CM | POA: Diagnosis not present

## 2019-03-02 DIAGNOSIS — C50919 Malignant neoplasm of unspecified site of unspecified female breast: Secondary | ICD-10-CM | POA: Diagnosis not present

## 2019-03-02 HISTORY — PX: LIPOSUCTION WITH LIPOFILLING: SHX6436

## 2019-03-02 SURGERY — LIPOSUCTION, WITH FAT TRANSFER
Anesthesia: General | Site: Abdomen | Laterality: Right

## 2019-03-02 MED ORDER — CEFAZOLIN SODIUM-DEXTROSE 2-4 GM/100ML-% IV SOLN
INTRAVENOUS | Status: AC
  Filled 2019-03-02: qty 100

## 2019-03-02 MED ORDER — BUPIVACAINE LIPOSOME 1.3 % IJ SUSP
INTRAMUSCULAR | Status: AC
  Filled 2019-03-02: qty 20

## 2019-03-02 MED ORDER — DEXAMETHASONE SODIUM PHOSPHATE 10 MG/ML IJ SOLN
INTRAMUSCULAR | Status: AC
  Filled 2019-03-02: qty 1

## 2019-03-02 MED ORDER — BACITRACIN ZINC 500 UNIT/GM EX OINT
TOPICAL_OINTMENT | CUTANEOUS | Status: AC
  Filled 2019-03-02: qty 28.35

## 2019-03-02 MED ORDER — LIDOCAINE-EPINEPHRINE 1 %-1:100000 IJ SOLN
INTRAMUSCULAR | Status: AC
  Filled 2019-03-02: qty 1

## 2019-03-02 MED ORDER — MIDAZOLAM HCL 2 MG/2ML IJ SOLN
INTRAMUSCULAR | Status: AC
  Filled 2019-03-02: qty 2

## 2019-03-02 MED ORDER — LIDOCAINE 2% (20 MG/ML) 5 ML SYRINGE
INTRAMUSCULAR | Status: AC
  Filled 2019-03-02: qty 5

## 2019-03-02 MED ORDER — FENTANYL CITRATE (PF) 100 MCG/2ML IJ SOLN
50.0000 ug | INTRAMUSCULAR | Status: AC | PRN
  Administered 2019-03-02: 75 ug via INTRAVENOUS
  Administered 2019-03-02 (×3): 25 ug via INTRAVENOUS

## 2019-03-02 MED ORDER — CHLORHEXIDINE GLUCONATE CLOTH 2 % EX PADS: 6.0000 | MEDICATED_PAD | Freq: Once | CUTANEOUS | Status: DC

## 2019-03-02 MED ORDER — PROPOFOL 10 MG/ML IV BOLUS
INTRAVENOUS | Status: DC | PRN
  Administered 2019-03-02: 170 mg via INTRAVENOUS
  Administered 2019-03-02: 30 mg via INTRAVENOUS

## 2019-03-02 MED ORDER — EPHEDRINE 5 MG/ML INJ
INTRAVENOUS | Status: AC
  Filled 2019-03-02: qty 10

## 2019-03-02 MED ORDER — DEXAMETHASONE SODIUM PHOSPHATE 4 MG/ML IJ SOLN
INTRAMUSCULAR | Status: DC | PRN
  Administered 2019-03-02: 10 mg via INTRAVENOUS

## 2019-03-02 MED ORDER — BUPIVACAINE HCL (PF) 0.25 % IJ SOLN
INTRAMUSCULAR | Status: AC
  Filled 2019-03-02: qty 30

## 2019-03-02 MED ORDER — PROPOFOL 500 MG/50ML IV EMUL
INTRAVENOUS | Status: DC | PRN
  Administered 2019-03-02: 15 ug/kg/min via INTRAVENOUS

## 2019-03-02 MED ORDER — LACTATED RINGERS IV SOLN
INTRAVENOUS | Status: DC
  Administered 2019-03-02 (×2): via INTRAVENOUS

## 2019-03-02 MED ORDER — FENTANYL CITRATE (PF) 100 MCG/2ML IJ SOLN
INTRAMUSCULAR | Status: AC
  Filled 2019-03-02: qty 2

## 2019-03-02 MED ORDER — EPINEPHRINE PF 1 MG/ML IJ SOLN
INTRAMUSCULAR | Status: AC
  Filled 2019-03-02: qty 4

## 2019-03-02 MED ORDER — SODIUM CHLORIDE 0.9 % IV SOLN
INTRAVENOUS | Status: DC | PRN
  Administered 2019-03-02: 2000 mL

## 2019-03-02 MED ORDER — EPHEDRINE SULFATE 50 MG/ML IJ SOLN
INTRAMUSCULAR | Status: DC | PRN
  Administered 2019-03-02 (×3): 10 mg via INTRAVENOUS

## 2019-03-02 MED ORDER — MIDAZOLAM HCL 2 MG/2ML IJ SOLN
1.0000 mg | INTRAMUSCULAR | Status: DC | PRN
  Administered 2019-03-02: 2 mg via INTRAVENOUS

## 2019-03-02 MED ORDER — GLYCOPYRROLATE 0.2 MG/ML IJ SOLN
INTRAMUSCULAR | Status: DC | PRN
  Administered 2019-03-02: 0.2 mg via INTRAVENOUS

## 2019-03-02 MED ORDER — LIDOCAINE HCL (PF) 1 % IJ SOLN
INTRAMUSCULAR | Status: AC
  Filled 2019-03-02: qty 60

## 2019-03-02 MED ORDER — ONDANSETRON HCL 4 MG/2ML IJ SOLN
INTRAMUSCULAR | Status: DC | PRN
  Administered 2019-03-02 (×2): 4 mg via INTRAVENOUS

## 2019-03-02 MED ORDER — ONDANSETRON HCL 4 MG/2ML IJ SOLN
INTRAMUSCULAR | Status: AC
  Filled 2019-03-02: qty 2

## 2019-03-02 MED ORDER — LIDOCAINE-EPINEPHRINE 1 %-1:100000 IJ SOLN
INTRAMUSCULAR | Status: DC | PRN
  Administered 2019-03-02: 10 mL

## 2019-03-02 MED ORDER — PROPOFOL 500 MG/50ML IV EMUL
INTRAVENOUS | Status: AC
  Filled 2019-03-02: qty 50

## 2019-03-02 MED ORDER — FENTANYL CITRATE (PF) 100 MCG/2ML IJ SOLN: 25.0000 ug | INTRAMUSCULAR | Status: DC | PRN

## 2019-03-02 MED ORDER — ROCURONIUM BROMIDE 10 MG/ML (PF) SYRINGE
PREFILLED_SYRINGE | INTRAVENOUS | Status: AC
  Filled 2019-03-02: qty 10

## 2019-03-02 MED ORDER — CEFAZOLIN SODIUM-DEXTROSE 2-4 GM/100ML-% IV SOLN
2.0000 g | INTRAVENOUS | Status: AC
  Administered 2019-03-02: 2 g via INTRAVENOUS

## 2019-03-02 MED ORDER — ARTIFICIAL TEARS OPHTHALMIC OINT
TOPICAL_OINTMENT | OPHTHALMIC | Status: AC
  Filled 2019-03-02: qty 3.5

## 2019-03-02 MED ORDER — SCOPOLAMINE 1 MG/3DAYS TD PT72
1.0000 | MEDICATED_PATCH | Freq: Once | TRANSDERMAL | Status: AC | PRN
  Administered 2019-03-02: 1 via TRANSDERMAL

## 2019-03-02 MED ORDER — ROCURONIUM BROMIDE 100 MG/10ML IV SOLN
INTRAVENOUS | Status: DC | PRN
  Administered 2019-03-02: 50 mg via INTRAVENOUS

## 2019-03-02 MED ORDER — SCOPOLAMINE 1 MG/3DAYS TD PT72
MEDICATED_PATCH | TRANSDERMAL | Status: AC
  Filled 2019-03-02: qty 1

## 2019-03-02 SURGICAL SUPPLY — 72 items
APL SKNCLS STERI-STRIP NONHPOA (GAUZE/BANDAGES/DRESSINGS) ×1
BENZOIN TINCTURE PRP APPL 2/3 (GAUZE/BANDAGES/DRESSINGS) ×2 IMPLANT
BINDER ABDOMINAL  9 SM 30-45 (SOFTGOODS)
BINDER ABDOMINAL 10 UNV 27-48 (MISCELLANEOUS) ×1 IMPLANT
BINDER ABDOMINAL 12 SM 30-45 (SOFTGOODS) ×1 IMPLANT
BINDER ABDOMINAL 9 SM 30-45 (SOFTGOODS) IMPLANT
BLADE HEX COATED 2.75 (ELECTRODE) ×2 IMPLANT
BLADE KNIFE PERSONA 10 (BLADE) ×1 IMPLANT
BLADE KNIFE PERSONA 15 (BLADE) ×3 IMPLANT
BNDG GAUZE ELAST 4 BULKY (GAUZE/BANDAGES/DRESSINGS) IMPLANT
CANISTER LIPO FAT HARVEST (MISCELLANEOUS) ×2 IMPLANT
CANISTER SUCT 1200ML W/VALVE (MISCELLANEOUS) ×1 IMPLANT
CAP BOUFFANT 24 BLUE NURSES (PROTECTIVE WEAR) ×2 IMPLANT
COVER BACK TABLE REUSABLE LG (DRAPES) ×2 IMPLANT
COVER MAYO STAND REUSABLE (DRAPES) ×3 IMPLANT
COVER WAND RF STERILE (DRAPES) IMPLANT
DECANTER SPIKE VIAL GLASS SM (MISCELLANEOUS) ×2 IMPLANT
DRAPE U-SHAPE 76X120 STRL (DRAPES) ×4 IMPLANT
DRSG PAD ABDOMINAL 8X10 ST (GAUZE/BANDAGES/DRESSINGS) ×4 IMPLANT
ELECT REM PT RETURN 9FT ADLT (ELECTROSURGICAL) ×2
ELECTRODE REM PT RTRN 9FT ADLT (ELECTROSURGICAL) ×1 IMPLANT
GAUZE SPONGE 4X4 12PLY STRL (GAUZE/BANDAGES/DRESSINGS) IMPLANT
GAUZE XEROFORM 1X8 LF (GAUZE/BANDAGES/DRESSINGS) ×1 IMPLANT
GLOVE BIO SURGEON STRL SZ 6.5 (GLOVE) ×1 IMPLANT
GLOVE BIO SURGEON STRL SZ7 (GLOVE) ×1 IMPLANT
GLOVE BIOGEL PI IND STRL 6.5 (GLOVE) IMPLANT
GLOVE BIOGEL PI IND STRL 7.5 (GLOVE) IMPLANT
GLOVE BIOGEL PI INDICATOR 6.5 (GLOVE)
GLOVE BIOGEL PI INDICATOR 7.5 (GLOVE) ×1
GLOVE SURG SS PI 6.5 STRL IVOR (GLOVE) ×3 IMPLANT
GLOVE SURG SS PI 7.0 STRL IVOR (GLOVE) ×2 IMPLANT
GOWN STRL REUS W/ TWL LRG LVL3 (GOWN DISPOSABLE) ×2 IMPLANT
GOWN STRL REUS W/TWL LRG LVL3 (GOWN DISPOSABLE) ×4
LINER CANISTER 1000CC FLEX (MISCELLANEOUS) ×2 IMPLANT
MARKER SKIN DUAL TIP RULER LAB (MISCELLANEOUS) ×2 IMPLANT
NDL HYPO 25X1 1.5 SAFETY (NEEDLE) ×1 IMPLANT
NDL SAFETY ECLIPSE 18X1.5 (NEEDLE) IMPLANT
NDL SPNL 18GX3.5 QUINCKE PK (NEEDLE) IMPLANT
NEEDLE HYPO 18GX1.5 SHARP (NEEDLE)
NEEDLE HYPO 25X1 1.5 SAFETY (NEEDLE) ×2 IMPLANT
NEEDLE SPNL 18GX3.5 QUINCKE PK (NEEDLE) IMPLANT
NS IRRIG 1000ML POUR BTL (IV SOLUTION) ×2 IMPLANT
PACK BASIN DAY SURGERY FS (CUSTOM PROCEDURE TRAY) ×2 IMPLANT
PENCIL BUTTON HOLSTER BLD 10FT (ELECTRODE) ×2 IMPLANT
SCRUB TECHNI CARE 4 OZ NO DYE (MISCELLANEOUS) ×2 IMPLANT
SHEET MEDIUM DRAPE 40X70 STRL (DRAPES) ×4 IMPLANT
SLEEVE SCD COMPRESS KNEE MED (MISCELLANEOUS) ×2 IMPLANT
SPECIMEN JAR MEDIUM (MISCELLANEOUS) ×3 IMPLANT
SPECIMEN JAR X LARGE (MISCELLANEOUS) IMPLANT
SPONGE LAP 18X18 RF (DISPOSABLE) ×4 IMPLANT
STAPLER VISISTAT 35W (STAPLE) ×2 IMPLANT
STRIP CLOSURE SKIN 1/2X4 (GAUZE/BANDAGES/DRESSINGS) ×2 IMPLANT
SUT ETHILON 3 0 PS 1 (SUTURE) ×1 IMPLANT
SUT MERSILENE 2.0 SH NDLE (SUTURE) IMPLANT
SUT MNCRL AB 3-0 PS2 18 (SUTURE) ×3 IMPLANT
SUT MNCRL AB 4-0 PS2 18 (SUTURE) ×3 IMPLANT
SUT MON AB 5-0 PS2 18 (SUTURE) IMPLANT
SUT PROLENE 3 0 PS 1 (SUTURE) IMPLANT
SYR 20CC LL (SYRINGE) ×4 IMPLANT
SYR 50ML LL SCALE MARK (SYRINGE) ×8 IMPLANT
SYR BULB IRRIGATION 50ML (SYRINGE) IMPLANT
SYR CONTROL 10ML LL (SYRINGE) ×2 IMPLANT
SYR TB 1ML LL NO SAFETY (SYRINGE) IMPLANT
SYR TOOMEY 50ML (SYRINGE) ×4 IMPLANT
TAPE MEASURE VINYL STERILE (MISCELLANEOUS) IMPLANT
TRAY DSU PREP LF (CUSTOM PROCEDURE TRAY) ×2 IMPLANT
TRAY FOL W/BAG SLVR 16FR STRL (SET/KITS/TRAYS/PACK) IMPLANT
TRAY FOLEY W/BAG SLVR 14FR LF (SET/KITS/TRAYS/PACK) IMPLANT
TRAY FOLEY W/BAG SLVR 16FR LF (SET/KITS/TRAYS/PACK)
TUBING INFILTRATION IT-10001 (TUBING) IMPLANT
TUBING SET GRADUATE ASPIR 12FT (MISCELLANEOUS) ×2 IMPLANT
UNDERPAD 30X30 (UNDERPADS AND DIAPERS) ×4 IMPLANT

## 2019-03-02 NOTE — Discharge Instructions (Signed)
°  Post Anesthesia Home Care Instructions  Activity: Get plenty of rest for the remainder of the day. A responsible individual must stay with you for 24 hours following the procedure.  For the next 24 hours, DO NOT: -Drive a car -Paediatric nurse -Drink alcoholic beverages -Take any medication unless instructed by your physician -Make any legal decisions or sign important papers.  Meals: Start with liquid foods such as gelatin or soup. Progress to regular foods as tolerated. Avoid greasy, spicy, heavy foods. If nausea and/or vomiting occur, drink only clear liquids until the nausea and/or vomiting subsides. Call your physician if vomiting continues.  Special Instructions/Symptoms: Your throat may feel dry or sore from the anesthesia or the breathing tube placed in your throat during surgery. If this causes discomfort, gargle with warm salt water. The discomfort should disappear within 24 hours.  If you had a scopolamine patch placed behind your ear for the management of post- operative nausea and/or vomiting:  1. The medication in the patch is effective for 72 hours, after which it should be removed.  Wrap patch in a tissue and discard in the trash. Wash hands thoroughly with soap and water. 2. You may remove the patch earlier than 72 hours if you experience unpleasant side effects which may include dry mouth, dizziness or visual disturbances. 3. Avoid touching the patch. Wash your hands with soap and water after contact with the patch.      1. No lifting greater than 5 lbs with arms for 4 weeks. 2. Percocet 5/325 mg tabs 1-2 tabs po q 4-6 hours prn pain- prescription given in office. 3. Duricef 1 tab po bid- prescription given in office. 4. Sterapred dose pack as directed- prescription given in office. 5. Follow-up appointment Friday in office.

## 2019-03-02 NOTE — Anesthesia Procedure Notes (Signed)
Procedure Name: Intubation Date/Time: 03/02/2019 7:51 AM Performed by: Lyndee Leo, CRNA Pre-anesthesia Checklist: Patient identified, Emergency Drugs available, Suction available and Patient being monitored Patient Re-evaluated:Patient Re-evaluated prior to induction Oxygen Delivery Method: Circle system utilized Preoxygenation: Pre-oxygenation with 100% oxygen Induction Type: IV induction Ventilation: Mask ventilation without difficulty Laryngoscope Size: Mac and 3 Grade View: Grade I Tube type: Oral Tube size: 7.0 mm Number of attempts: 1 Airway Equipment and Method: Stylet and Oral airway Placement Confirmation: ETT inserted through vocal cords under direct vision,  positive ETCO2 and breath sounds checked- equal and bilateral Tube secured with: Tape Dental Injury: Teeth and Oropharynx as per pre-operative assessment

## 2019-03-02 NOTE — Anesthesia Postprocedure Evaluation (Signed)
Anesthesia Post Note  Patient: [name]  Procedure(s) Performed: LIPOSUCTION OF ABDOMEN HIPS AND FLANKS WITH FAT GRAFTING TO RIGHT BREAT (Right Abdomen)     Patient location during evaluation: PACU Anesthesia Type: General Level of consciousness: awake Pain management: pain level controlled Vital Signs Assessment: post-procedure vital signs reviewed and stable Respiratory status: spontaneous breathing Cardiovascular status: stable Postop Assessment: no apparent nausea or vomiting Anesthetic complications: no    Last Vitals:  Vitals:   03/02/19 1200 03/02/19 1215  BP: 136/86 134/84  Pulse: 84 85  Resp: 14 13  Temp:  (!) 36.4 C  SpO2: 95% 97%    Last Pain:  Vitals:   03/02/19 1215  TempSrc:   PainSc: 0-No pain                 Cartel Mauss

## 2019-03-02 NOTE — H&P (Signed)
  H&P faxed to surgical center.  -History and Physical Reviewed  -Patient has been re-examined  -No change in the plan of care  [name]    

## 2019-03-02 NOTE — Transfer of Care (Signed)
Immediate Anesthesia Transfer of Care Note  Patient: [name]  Procedure(s) Performed: LIPOSUCTION OF ABDOMEN HIPS AND FLANKS WITH FAT GRAFTING TO RIGHT BREAT (Right Abdomen)  Patient Location: PACU  Anesthesia Type:General  Level of Consciousness: sedated and responds to stimulation  Airway & Oxygen Therapy: Patient Spontanous Breathing and Patient connected to face mask oxygen  Post-op Assessment: Report given to RN and Post -op Vital signs reviewed and stable  Post vital signs: Reviewed and stable  Last Vitals:  Vitals Value Taken Time  BP 126/74 03/02/2019 11:10 AM  Temp    Pulse 77 03/02/2019 11:13 AM  Resp 15 03/02/2019 11:13 AM  SpO2 100 % 03/02/2019 11:13 AM  Vitals shown include unvalidated device data.  Last Pain:  Vitals:   03/02/19 0630  TempSrc: Temporal  PainSc: 0-No pain         Complications: No apparent anesthesia complications

## 2019-03-02 NOTE — Brief Op Note (Signed)
03/02/2019  11:18 AM  PATIENT:  [name]  58 y.o. female  PRE-OPERATIVE DIAGNOSIS: RIGHT BREAST CANCER  POST-OPERATIVE DIAGNOSIS: RIGHT BREAST CANCER  PROCEDURE:  Procedure(s): LIPOSUCTION OF ABDOMEN HIPS AND FLANKS WITH FAT GRAFTING TO RIGHT BREAT (Right)  SURGEON:  Surgeon(s) and Role:    * Contogiannis, Audrea Muscat, MD - Primary  ANESTHESIA:   general  EBL:  50 cc  BLOOD ADMINISTERED:none  DRAINS: none   LOCAL MEDICATIONS USED:  LIDOCAINE   SPECIMEN:  No Specimen  DISPOSITION OF SPECIMEN:  N/A  COUNTS:  YES  DICTATION: .Other Dictation: Dictation Number [id number]  PLAN OF CARE: Discharge to home after PACU  PATIENT DISPOSITION:  PACU - hemodynamically stable.   Delay start of Pharmacological VTE agent (>24hrs) due to surgical blood loss or risk of bleeding: not applicable

## 2019-03-03 ENCOUNTER — Encounter (HOSPITAL_BASED_OUTPATIENT_CLINIC_OR_DEPARTMENT_OTHER): Payer: Self-pay | Admitting: Plastic Surgery

## 2019-03-31 DIAGNOSIS — Z86018 Personal history of other benign neoplasm: Secondary | ICD-10-CM | POA: Diagnosis not present

## 2019-03-31 DIAGNOSIS — Z808 Family history of malignant neoplasm of other organs or systems: Secondary | ICD-10-CM | POA: Diagnosis not present

## 2019-03-31 DIAGNOSIS — L821 Other seborrheic keratosis: Secondary | ICD-10-CM | POA: Diagnosis not present

## 2019-03-31 DIAGNOSIS — L820 Inflamed seborrheic keratosis: Secondary | ICD-10-CM | POA: Diagnosis not present

## 2019-03-31 DIAGNOSIS — L814 Other melanin hyperpigmentation: Secondary | ICD-10-CM | POA: Diagnosis not present

## 2019-04-02 IMAGING — US US THYROID
1 series · 13 of 25 positions shown · non-contrast
Comparison: None.

CLINICAL DATA: Thyromegaly on exam

EXAM:
THYROID ULTRASOUND
TECHNIQUE: Ultrasound examination of the thyroid gland and adjacent soft
tissues was performed.

[Series 1: us thyroid · 0.04mm/px · 13 of 61 slices shown]
[im 1/61]
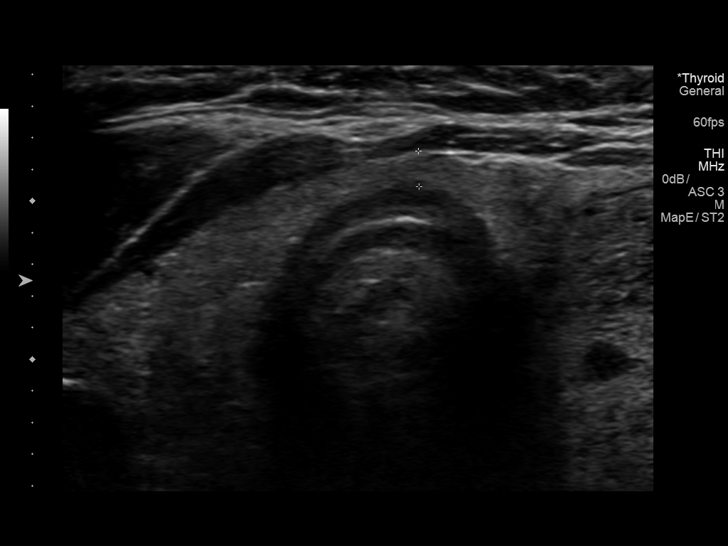
[im 6/61]
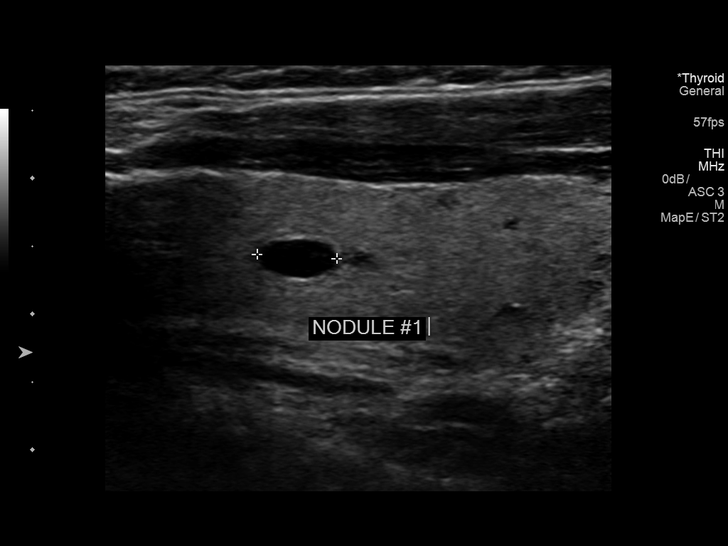
[im 11/61]
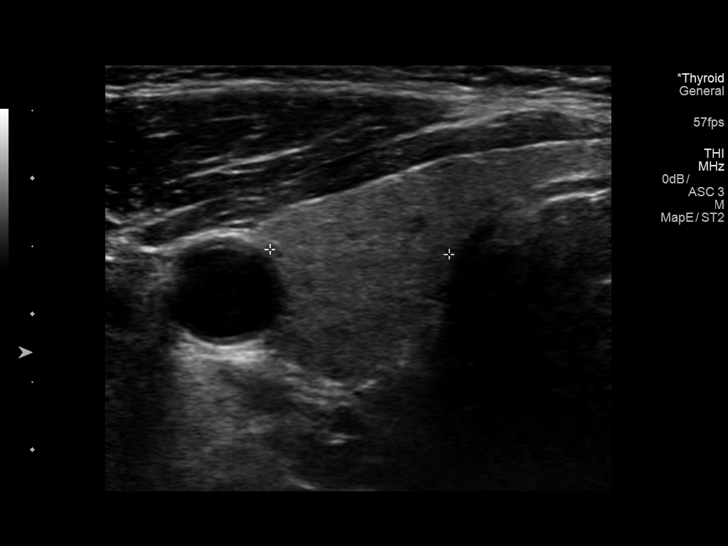
[im 16/61]
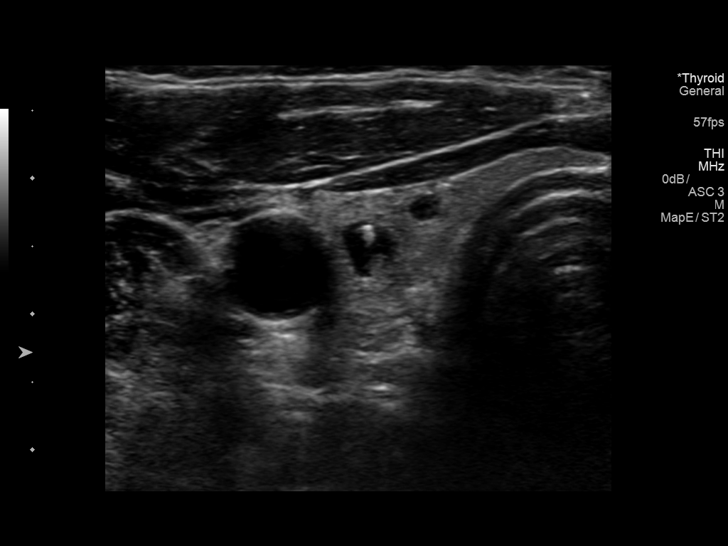
[im 21/61]
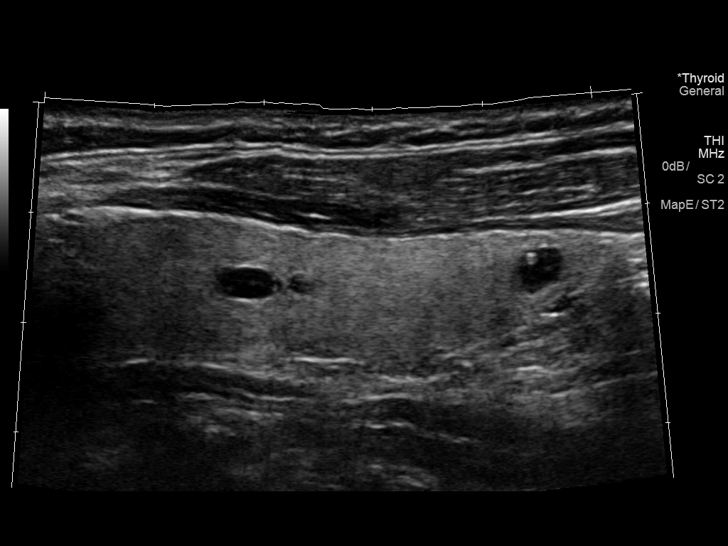
[im 26/61]
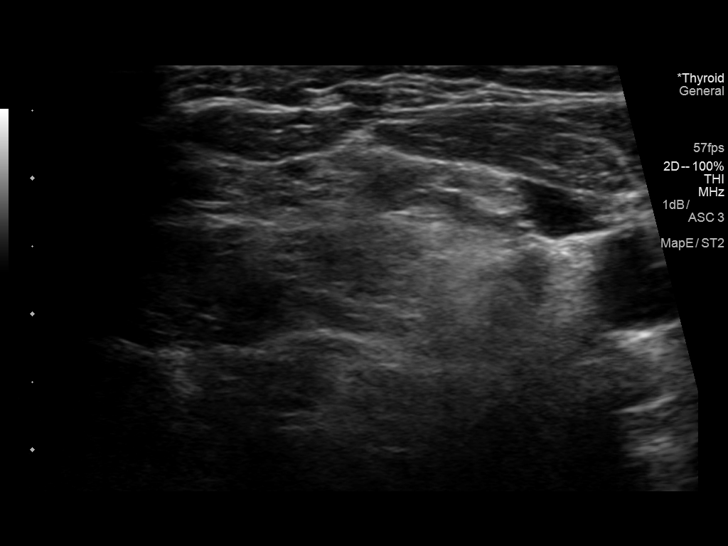
[im 31/61]
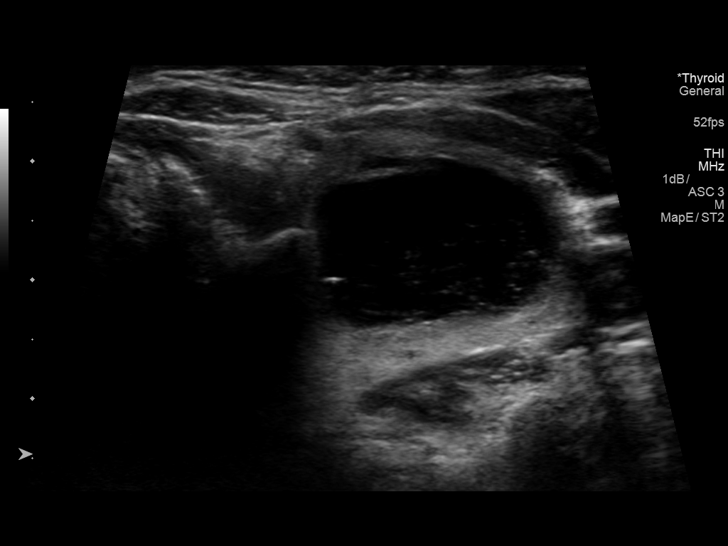
[im 36/61]
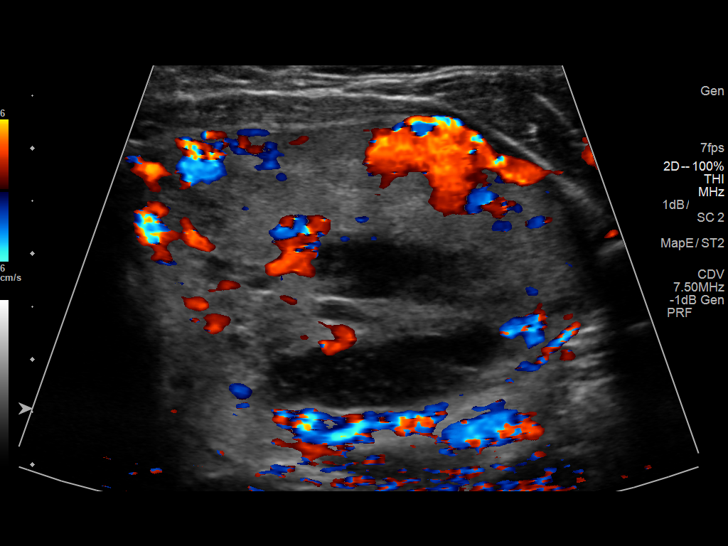
[im 41/61]
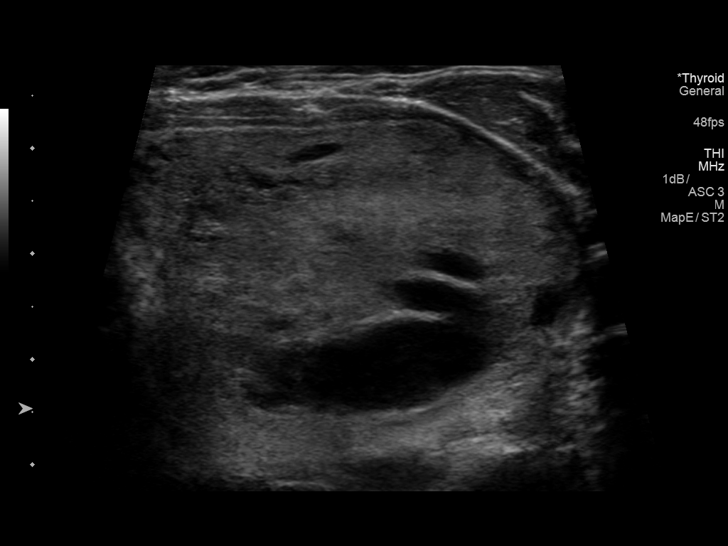
[im 46/61]
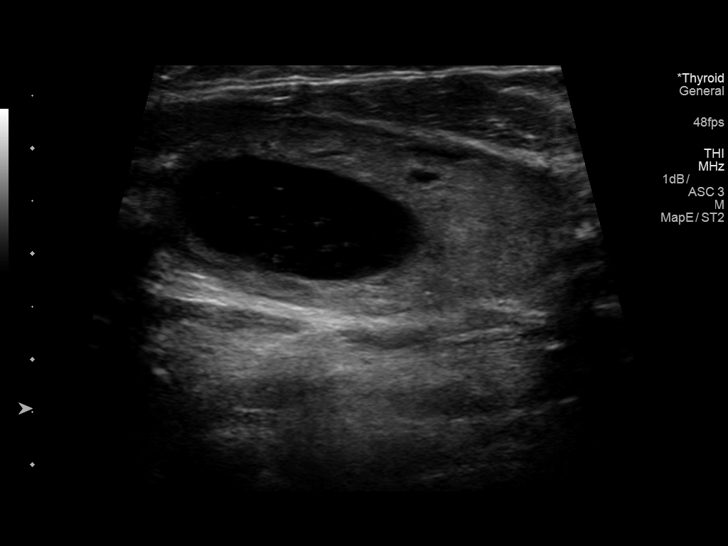
[im 51/61]
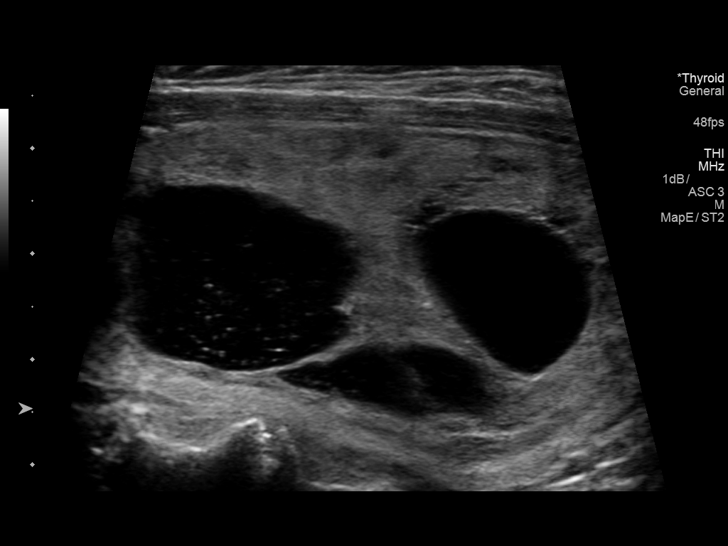
[im 56/61]
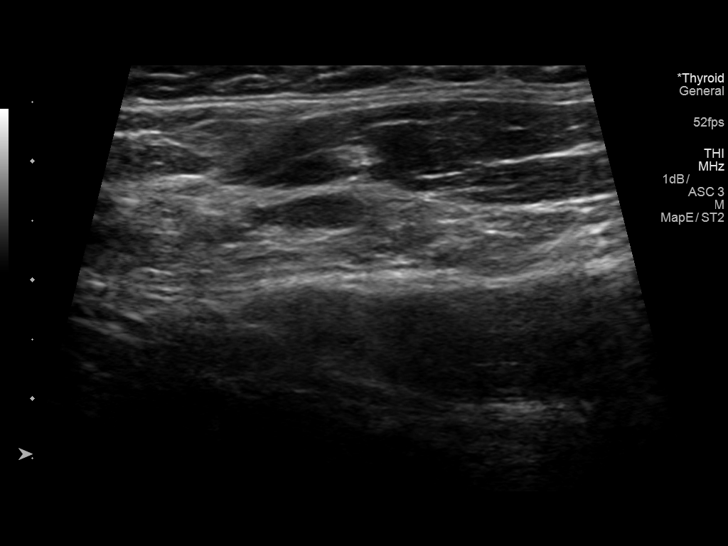
[im 61/61]
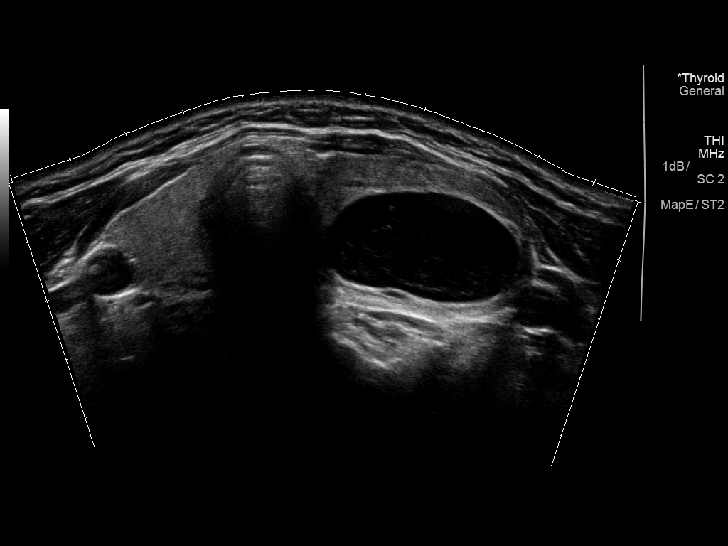

[13 of 25 positions shown; findings below may reference images not displayed]

FINDINGS: Parenchymal Echotexture: Moderately heterogenous

Isthmus: 2 mm

Right lobe:   5.0 x 1.3 x 1.3 cm

Left lobe:   6.5 x 3.2 x 4.5 cm

_________________________________________________________

Estimated total number of nodules >/= 1 cm: 1

Number of spongiform nodules >/=  2 cm not described below (TR1): 0

Number of mixed cystic and solid nodules >/= 1.5 cm not described
below (TR2): 0

_________________________________________________________

Nodule # 2:

Location: Left; Mid

Maximum size: 6.2 cm; Other 2 dimensions:   3.4 x 4.2 cm

Composition: mixed cystic and solid (1)

Echogenicity: isoechoic (1)

Shape: not taller-than-wide (0)

Margins: ill-defined (0)

Echogenic foci: none (0)

ACR TI-RADS total points: 2.

ACR TI-RADS risk category: TR2 (2 points).

ACR TI-RADS recommendations:

This nodule does NOT meet TI-RADS criteria for biopsy or dedicated
follow-up.

_________________________________________________________

Additional scattered subcentimeter cystic nodule in the right lobe
all measure 6 mm or less in size. These would not meet criteria for
any biopsy or follow-up.

No adenopathy.
IMPRESSION: 6.2 cm left mid thyroid TR 2 nodule does not meet criteria for
biopsy or any additional follow-up.

The above is in keeping with the ACR TI-RADS recommendations - [HOSPITAL] 2118;[DATE].

## 2019-04-29 NOTE — Op Note (Signed)
NAME: [name], [name] MEDICAL RECORD [id number] ACCOUNT [id number] DATE OF BIRTH:Sep 13, 1961 FACILITY: [facility] LOCATION: [facility] PHYSICIAN:Afsa Meany Jennye Boroughs, MD  OPERATIVE REPORT  DATE OF PROCEDURE:  03/02/2019  PREOPERATIVE DIAGNOSES: 1.  History of right breast cancer status post right lumpectomy. 2.  Breast asymmetry. 3.  Desire for desire for fuller right breast.  POSTOPERATIVE DIAGNOSES:  1.  History of right breast cancer status post right lumpectomy. 2.  Breast asymmetry. 3.  Desire for desire for fuller right breast.  PROCEDURE: 1.  Right breast reconstruction using fat grafting. 2.  Liposuction of the abdomen.  ATTENDING SURGEON:  Hetty Blend, MD  ANESTHESIA:  General.  COMPLICATIONS:  None.  INDICATIONS:  The patient is a 58 year old Caucasian female who was previously diagnosed with right breast cancer.  She was treated with a right lumpectomy followed by radiation therapy and as a result, has a significant asymmetry present between her 2  breasts.  Due to the history of radiation therapy, we would like to proceed with a right breast reconstruction using micro fat grafting.  As I have previously discussed, it will take several fat grafting sessions in order to give her a large volume to  the right breast.  We will proceed with another micro fat grafting today and perform liposuction of the abdomen as a source for the fat grafting.  DESCRIPTION OF PROCEDURE:  The patient was marked in the preop holding area for both the liposuction of the abdomen as well as the fat grafting of the right breast.  She was then taken back to the OR, and placed on the table in the supine position.   After adequate general anesthesia was obtained, the patient's breast/chest and abdomen were prepped with Techni-Care and draped in a sterile fashion.  Tumescent fluid was infused into the abdomen in the amount as recorded on the accompanying nursing  notes.  After allowing the  tumescent fluid to take effect for at least 20-25 minutes, I performed power-assisted liposuction of the abdomen.  The aspirated fat was then processed to proceed with the micro fat grafting.  The fat grafting was then  performed to the right breast in its entirety decreased the entire to increase the entire volume of the breast.  Using small incision sites for the injection cannulas, a total of 435 mL of fat was injected.  This appeared to give a nice volume and shape  to the patient's breast.  Of course postoperatively we noted that about 60-70% of fat aspirated will survive so we will see what this will be for her over time.  The small access incisions for the liposuction as well as for the fat grafting were then  closed using a 3-0 Monocryl in the dermal layer followed by 3-0 Monocryl running intracuticular stitch on the skin.  The area around the base of the right breast including the chest wall musculature was then injected with 0.5% Marcaine with epinephrine  to provide postoperative pain relief.  There were no complications.  The patient tolerated the procedure well.  The patient was then placed into a light postoperative support bra for her breast.  She also was then placed into an abdominal binder for the abdomen.  She was then awakened from the general anesthesia and taken to the recovery room in stable condition.   She was recovered without complications.  The patient and her husband were given a proper postoperative wound care instructions.  She was then discharged home to the care of her husband in  stable condition.  Followup appointment will be tomorrow in the  office.  TN/NUANCE  D:04/29/2019 T:04/29/2019 JOB:[id number]

## 2019-06-03 ENCOUNTER — Other Ambulatory Visit: Payer: Self-pay

## 2019-06-03 ENCOUNTER — Ambulatory Visit (INDEPENDENT_AMBULATORY_CARE_PROVIDER_SITE_OTHER): Payer: BC Managed Care – PPO

## 2019-06-03 ENCOUNTER — Encounter: Payer: Self-pay | Admitting: Podiatry

## 2019-06-03 ENCOUNTER — Ambulatory Visit (INDEPENDENT_AMBULATORY_CARE_PROVIDER_SITE_OTHER): Payer: BC Managed Care – PPO | Admitting: Podiatry

## 2019-06-03 ENCOUNTER — Other Ambulatory Visit: Payer: Self-pay | Admitting: Podiatry

## 2019-06-03 DIAGNOSIS — M7752 Other enthesopathy of left foot: Secondary | ICD-10-CM

## 2019-06-03 DIAGNOSIS — M779 Enthesopathy, unspecified: Secondary | ICD-10-CM

## 2019-06-03 DIAGNOSIS — M7751 Other enthesopathy of right foot: Secondary | ICD-10-CM

## 2019-06-03 DIAGNOSIS — M79675 Pain in left toe(s): Secondary | ICD-10-CM | POA: Diagnosis not present

## 2019-06-04 NOTE — Progress Notes (Signed)
Subjective:   Patient ID: [name], female   DOB: 58 y.o.   MRN: [id number]   HPI Patient states she is getting a lot of forefoot pain left and states she has had this in the past and it has become gradually more irritated over the last few months and we have not seen her for number years.  Also orthotics are no longer holding her arches up and she knows she needs to do what is they are very effective in helping her.  Patient does not smoke likes to be active   Review of Systems  All other systems reviewed and are negative.       Objective:  Physical Exam Vitals signs and nursing note reviewed.  Constitutional:      Appearance: She is well-developed.  Pulmonary:     Effort: Pulmonary effort is normal.  Musculoskeletal: Normal range of motion.  Skin:    General: Skin is warm.  Neurological:     Mental Status: She is alert.     Neurovascular status intact muscle strength found to be adequate range of motion within normal limits.  Patient is found to have quite a bit of discomfort more in the third metatarsal phalangeal joint left with inflammation and slightly into the interspace.  Patient has good digital perfusion well oriented x3 with moderate depression of the arch and has orthotics that are no longer holding her arch up appropriately     Assessment:  Probability for inflammatory capsulitis versus neuroma symptomatology left with moderate depression of the arch noted bilateral     Plan:  H&P conditions reviewed and at this point I did a forefoot block left I then aspirated the joint getting that a small amount of clear fluid and injected quarter cc dexamethasone Kenalog and then went ahead today casted for functional orthotics to reduce all stress of the plantar arch and forefoot.  Patient will be seen back when ready and will see me if there is any symptoms present  X-rays indicate that there is no indication to stress fracture or arthritis or metatarsal parabola with  moderate depression of the arch

## 2019-06-24 ENCOUNTER — Other Ambulatory Visit: Payer: Self-pay | Admitting: Orthotics

## 2019-06-24 ENCOUNTER — Other Ambulatory Visit: Payer: Self-pay

## 2019-06-24 DIAGNOSIS — M79676 Pain in unspecified toe(s): Secondary | ICD-10-CM

## 2019-06-29 DIAGNOSIS — C50919 Malignant neoplasm of unspecified site of unspecified female breast: Secondary | ICD-10-CM | POA: Diagnosis not present

## 2019-07-06 ENCOUNTER — Encounter (HOSPITAL_BASED_OUTPATIENT_CLINIC_OR_DEPARTMENT_OTHER): Payer: Self-pay | Admitting: *Deleted

## 2019-07-06 ENCOUNTER — Other Ambulatory Visit: Payer: Self-pay

## 2019-07-08 ENCOUNTER — Ambulatory Visit: Payer: Self-pay | Admitting: Plastic Surgery

## 2019-07-09 ENCOUNTER — Other Ambulatory Visit (HOSPITAL_COMMUNITY)
Admission: RE | Admit: 2019-07-09 | Discharge: 2019-07-09 | Disposition: A | Discharge status: Home / Self Care | Payer: BC Managed Care – PPO | Source: Ambulatory Visit | Attending: Plastic Surgery | Admitting: Plastic Surgery

## 2019-07-09 DIAGNOSIS — Z01812 Encounter for preprocedural laboratory examination: Secondary | ICD-10-CM | POA: Diagnosis not present

## 2019-07-09 DIAGNOSIS — Z20828 Contact with and (suspected) exposure to other viral communicable diseases: Secondary | ICD-10-CM | POA: Diagnosis not present

## 2019-07-12 LAB — NOVEL CORONAVIRUS, NAA (HOSP ORDER, SEND-OUT TO REF LAB; TAT 18-24 HRS): SARS-CoV-2, NAA: NOT DETECTED

## 2019-07-13 ENCOUNTER — Ambulatory Visit (HOSPITAL_BASED_OUTPATIENT_CLINIC_OR_DEPARTMENT_OTHER): Payer: BC Managed Care – PPO | Admitting: Certified Registered"

## 2019-07-13 ENCOUNTER — Ambulatory Visit (HOSPITAL_BASED_OUTPATIENT_CLINIC_OR_DEPARTMENT_OTHER)
Admission: RE | Admit: 2019-07-13 | Discharge: 2019-07-13 | Disposition: A | Discharge status: Home / Self Care | Payer: BC Managed Care – PPO | Attending: Plastic Surgery | Admitting: Plastic Surgery

## 2019-07-13 ENCOUNTER — Encounter (HOSPITAL_BASED_OUTPATIENT_CLINIC_OR_DEPARTMENT_OTHER): Payer: Self-pay | Admitting: Certified Registered"

## 2019-07-13 ENCOUNTER — Other Ambulatory Visit: Payer: Self-pay

## 2019-07-13 ENCOUNTER — Encounter (HOSPITAL_BASED_OUTPATIENT_CLINIC_OR_DEPARTMENT_OTHER)
Admission: RE | Disposition: A | Discharge status: Home / Self Care | Payer: Self-pay | Source: Home / Self Care | Attending: Plastic Surgery

## 2019-07-13 DIAGNOSIS — N651 Disproportion of reconstructed breast: Secondary | ICD-10-CM | POA: Insufficient documentation

## 2019-07-13 DIAGNOSIS — Z923 Personal history of irradiation: Secondary | ICD-10-CM | POA: Insufficient documentation

## 2019-07-13 DIAGNOSIS — Z9221 Personal history of antineoplastic chemotherapy: Secondary | ICD-10-CM | POA: Diagnosis not present

## 2019-07-13 DIAGNOSIS — Z87891 Personal history of nicotine dependence: Secondary | ICD-10-CM | POA: Insufficient documentation

## 2019-07-13 DIAGNOSIS — N6012 Diffuse cystic mastopathy of left breast: Secondary | ICD-10-CM | POA: Diagnosis not present

## 2019-07-13 DIAGNOSIS — Z853 Personal history of malignant neoplasm of breast: Secondary | ICD-10-CM | POA: Diagnosis not present

## 2019-07-13 DIAGNOSIS — Z79899 Other long term (current) drug therapy: Secondary | ICD-10-CM | POA: Diagnosis not present

## 2019-07-13 DIAGNOSIS — C50919 Malignant neoplasm of unspecified site of unspecified female breast: Secondary | ICD-10-CM | POA: Diagnosis not present

## 2019-07-13 DIAGNOSIS — K219 Gastro-esophageal reflux disease without esophagitis: Secondary | ICD-10-CM | POA: Diagnosis not present

## 2019-07-13 DIAGNOSIS — Z91040 Latex allergy status: Secondary | ICD-10-CM | POA: Insufficient documentation

## 2019-07-13 DIAGNOSIS — N6481 Ptosis of breast: Secondary | ICD-10-CM | POA: Diagnosis not present

## 2019-07-13 HISTORY — PX: MASTOPEXY: SHX5358

## 2019-07-13 SURGERY — MASTOPEXY
Anesthesia: General | Site: Breast | Laterality: Left

## 2019-07-13 MED ORDER — ONDANSETRON HCL 4 MG/2ML IJ SOLN
INTRAMUSCULAR | Status: DC | PRN
  Administered 2019-07-13: 4 mg via INTRAVENOUS

## 2019-07-13 MED ORDER — CHLORHEXIDINE GLUCONATE CLOTH 2 % EX PADS: 6.0000 | MEDICATED_PAD | Freq: Once | CUTANEOUS | Status: DC

## 2019-07-13 MED ORDER — ACETAMINOPHEN 500 MG PO TABS
ORAL_TABLET | ORAL | Status: AC
  Filled 2019-07-13: qty 2

## 2019-07-13 MED ORDER — CELECOXIB 200 MG PO CAPS
ORAL_CAPSULE | ORAL | Status: AC
  Filled 2019-07-13: qty 1

## 2019-07-13 MED ORDER — SODIUM CHLORIDE (PF) 0.9 % IJ SOLN
INTRAMUSCULAR | Status: AC
  Filled 2019-07-13: qty 10

## 2019-07-13 MED ORDER — PROPOFOL 10 MG/ML IV BOLUS
INTRAVENOUS | Status: DC | PRN
  Administered 2019-07-13: 150 mg via INTRAVENOUS

## 2019-07-13 MED ORDER — LACTATED RINGERS IV SOLN
INTRAVENOUS | Status: DC
  Administered 2019-07-13 (×2): via INTRAVENOUS

## 2019-07-13 MED ORDER — DEXAMETHASONE SODIUM PHOSPHATE 10 MG/ML IJ SOLN
INTRAMUSCULAR | Status: AC
  Filled 2019-07-13: qty 2

## 2019-07-13 MED ORDER — FENTANYL CITRATE (PF) 100 MCG/2ML IJ SOLN
INTRAMUSCULAR | Status: AC
  Filled 2019-07-13: qty 2

## 2019-07-13 MED ORDER — SCOPOLAMINE 1 MG/3DAYS TD PT72
1.0000 | MEDICATED_PATCH | Freq: Once | TRANSDERMAL | Status: DC
  Administered 2019-07-13: 1.5 mg via TRANSDERMAL

## 2019-07-13 MED ORDER — BUPIVACAINE HCL (PF) 0.25 % IJ SOLN
INTRAMUSCULAR | Status: AC
  Filled 2019-07-13: qty 30

## 2019-07-13 MED ORDER — FENTANYL CITRATE (PF) 100 MCG/2ML IJ SOLN: 25.0000 ug | INTRAMUSCULAR | Status: DC | PRN

## 2019-07-13 MED ORDER — SODIUM CHLORIDE (PF) 0.9 % IJ SOLN
INTRAMUSCULAR | Status: DC | PRN
  Administered 2019-07-13: 30 mL

## 2019-07-13 MED ORDER — CELECOXIB 200 MG PO CAPS
200.0000 mg | ORAL_CAPSULE | Freq: Once | ORAL | Status: AC
  Administered 2019-07-13: 200 mg via ORAL

## 2019-07-13 MED ORDER — SUGAMMADEX SODIUM 200 MG/2ML IV SOLN
INTRAVENOUS | Status: DC | PRN
  Administered 2019-07-13: 200 mg via INTRAVENOUS

## 2019-07-13 MED ORDER — SODIUM CHLORIDE (PF) 0.9 % IJ SOLN
INTRAMUSCULAR | Status: AC
  Filled 2019-07-13: qty 20

## 2019-07-13 MED ORDER — SODIUM CHLORIDE 0.9 % IV SOLN
INTRAVENOUS | Status: DC | PRN
  Administered 2019-07-13: 60 mL

## 2019-07-13 MED ORDER — EPHEDRINE SULFATE 50 MG/ML IJ SOLN
INTRAMUSCULAR | Status: DC | PRN
  Administered 2019-07-13 (×2): 10 mg via INTRAVENOUS
  Administered 2019-07-13: 15 mg via INTRAVENOUS

## 2019-07-13 MED ORDER — DEXAMETHASONE SODIUM PHOSPHATE 4 MG/ML IJ SOLN
INTRAMUSCULAR | Status: DC | PRN
  Administered 2019-07-13: 10 mg via INTRAVENOUS

## 2019-07-13 MED ORDER — MIDAZOLAM HCL 2 MG/2ML IJ SOLN
1.0000 mg | INTRAMUSCULAR | Status: DC | PRN
  Administered 2019-07-13: 2 mg via INTRAVENOUS

## 2019-07-13 MED ORDER — PROPOFOL 500 MG/50ML IV EMUL
INTRAVENOUS | Status: DC | PRN
  Administered 2019-07-13: 25 ug/kg/min via INTRAVENOUS

## 2019-07-13 MED ORDER — BUPIVACAINE-EPINEPHRINE (PF) 0.5% -1:200000 IJ SOLN
INTRAMUSCULAR | Status: AC
  Filled 2019-07-13: qty 30

## 2019-07-13 MED ORDER — ROCURONIUM BROMIDE 100 MG/10ML IV SOLN
INTRAVENOUS | Status: DC | PRN
  Administered 2019-07-13: 60 mg via INTRAVENOUS

## 2019-07-13 MED ORDER — CEFAZOLIN SODIUM-DEXTROSE 2-4 GM/100ML-% IV SOLN
INTRAVENOUS | Status: AC
  Filled 2019-07-13: qty 100

## 2019-07-13 MED ORDER — MIDAZOLAM HCL 2 MG/2ML IJ SOLN
INTRAMUSCULAR | Status: AC
  Filled 2019-07-13: qty 2

## 2019-07-13 MED ORDER — SCOPOLAMINE 1 MG/3DAYS TD PT72
MEDICATED_PATCH | TRANSDERMAL | Status: AC
  Filled 2019-07-13: qty 1

## 2019-07-13 MED ORDER — ROCURONIUM BROMIDE 10 MG/ML (PF) SYRINGE
PREFILLED_SYRINGE | INTRAVENOUS | Status: AC
  Filled 2019-07-13: qty 20

## 2019-07-13 MED ORDER — LIDOCAINE HCL (CARDIAC) PF 100 MG/5ML IV SOSY
PREFILLED_SYRINGE | INTRAVENOUS | Status: DC | PRN
  Administered 2019-07-13: 60 mg via INTRAVENOUS

## 2019-07-13 MED ORDER — BUPIVACAINE-EPINEPHRINE 0.25% -1:200000 IJ SOLN
INTRAMUSCULAR | Status: AC
  Filled 2019-07-13: qty 1

## 2019-07-13 MED ORDER — SODIUM CHLORIDE (PF) 0.9 % IJ SOLN
INTRAMUSCULAR | Status: AC
  Filled 2019-07-13: qty 40

## 2019-07-13 MED ORDER — PROMETHAZINE HCL 25 MG/ML IJ SOLN: 6.2500 mg | INTRAMUSCULAR | Status: DC | PRN

## 2019-07-13 MED ORDER — CEFAZOLIN SODIUM-DEXTROSE 2-4 GM/100ML-% IV SOLN
2.0000 g | INTRAVENOUS | Status: AC
  Administered 2019-07-13: 2 g via INTRAVENOUS

## 2019-07-13 MED ORDER — EPHEDRINE 5 MG/ML INJ
INTRAVENOUS | Status: AC
  Filled 2019-07-13: qty 20

## 2019-07-13 MED ORDER — SUGAMMADEX SODIUM 500 MG/5ML IV SOLN
INTRAVENOUS | Status: AC
  Filled 2019-07-13: qty 5

## 2019-07-13 MED ORDER — BACITRACIN ZINC 500 UNIT/GM EX OINT
TOPICAL_OINTMENT | CUTANEOUS | Status: AC
  Filled 2019-07-13: qty 28.35

## 2019-07-13 MED ORDER — LIDOCAINE-EPINEPHRINE 1 %-1:100000 IJ SOLN
INTRAMUSCULAR | Status: AC
  Filled 2019-07-13: qty 1

## 2019-07-13 MED ORDER — FENTANYL CITRATE (PF) 100 MCG/2ML IJ SOLN
50.0000 ug | INTRAMUSCULAR | Status: AC | PRN
  Administered 2019-07-13 (×2): 50 ug via INTRAVENOUS
  Administered 2019-07-13: 100 ug via INTRAVENOUS

## 2019-07-13 MED ORDER — BUPIVACAINE LIPOSOME 1.3 % IJ SUSP
INTRAMUSCULAR | Status: AC
  Filled 2019-07-13: qty 20

## 2019-07-13 MED ORDER — ONDANSETRON HCL 4 MG/2ML IJ SOLN
INTRAMUSCULAR | Status: AC
  Filled 2019-07-13: qty 8

## 2019-07-13 MED ORDER — ACETAMINOPHEN 500 MG PO TABS
1000.0000 mg | ORAL_TABLET | Freq: Once | ORAL | Status: AC
  Administered 2019-07-13: 1000 mg via ORAL

## 2019-07-13 SURGICAL SUPPLY — 72 items
APL SKNCLS STERI-STRIP NONHPOA (GAUZE/BANDAGES/DRESSINGS) ×2
BAG DECANTER FOR FLEXI CONT (MISCELLANEOUS) ×1 IMPLANT
BENZOIN TINCTURE PRP APPL 2/3 (GAUZE/BANDAGES/DRESSINGS) ×6 IMPLANT
BLADE KNIFE PERSONA 10 (BLADE) ×8 IMPLANT
BLADE KNIFE PERSONA 15 (BLADE) ×7 IMPLANT
BNDG GAUZE ELAST 4 BULKY (GAUZE/BANDAGES/DRESSINGS) ×6 IMPLANT
CANISTER SUCT 1200ML W/VALVE (MISCELLANEOUS) ×3 IMPLANT
CAP BOUFFANT 24 BLUE NURSES (PROTECTIVE WEAR) ×3 IMPLANT
CLOSURE WOUND 1/2 X4 (GAUZE/BANDAGES/DRESSINGS) ×2
COVER BACK TABLE REUSABLE LG (DRAPES) ×3 IMPLANT
COVER MAYO STAND REUSABLE (DRAPES) ×3 IMPLANT
COVER WAND RF STERILE (DRAPES) IMPLANT
DECANTER SPIKE VIAL GLASS SM (MISCELLANEOUS) ×2 IMPLANT
DRAIN CHANNEL 10F 3/8 F FF (DRAIN) ×2 IMPLANT
DRAPE LAPAROSCOPIC ABDOMINAL (DRAPES) ×3 IMPLANT
DRSG EMULSION OIL 3X3 NADH (GAUZE/BANDAGES/DRESSINGS) ×4 IMPLANT
DRSG PAD ABDOMINAL 8X10 ST (GAUZE/BANDAGES/DRESSINGS) ×6 IMPLANT
ELECT REM PT RETURN 9FT ADLT (ELECTROSURGICAL) ×3
ELECTRODE REM PT RTRN 9FT ADLT (ELECTROSURGICAL) ×1 IMPLANT
EVACUATOR SILICONE 100CC (DRAIN) ×2 IMPLANT
FILTER 7/8 IN (FILTER) IMPLANT
GAUZE SPONGE 4X4 12PLY STRL (GAUZE/BANDAGES/DRESSINGS) ×6 IMPLANT
GAUZE XEROFORM 5X9 LF (GAUZE/BANDAGES/DRESSINGS) ×2 IMPLANT
GLOVE BIO SURGEON STRL SZ7 (GLOVE) ×1 IMPLANT
GLOVE BIOGEL PI IND STRL 6.5 (GLOVE) IMPLANT
GLOVE BIOGEL PI IND STRL 7.0 (GLOVE) IMPLANT
GLOVE BIOGEL PI INDICATOR 6.5 (GLOVE) ×4
GLOVE BIOGEL PI INDICATOR 7.0 (GLOVE) ×2
GLOVE SURG SS PI 6.5 STRL IVOR (GLOVE) ×4 IMPLANT
GLOVE SURG SS PI 7.0 STRL IVOR (GLOVE) ×2 IMPLANT
GOWN STRL REUS W/ TWL LRG LVL3 (GOWN DISPOSABLE) ×2 IMPLANT
GOWN STRL REUS W/TWL LRG LVL3 (GOWN DISPOSABLE) ×9
IV NS 250ML (IV SOLUTION)
IV NS 250ML BAXH (IV SOLUTION) ×1 IMPLANT
MARKER SKIN DUAL TIP RULER LAB (MISCELLANEOUS) ×2 IMPLANT
NDL HYPO 25X1 1.5 SAFETY (NEEDLE) ×3 IMPLANT
NDL KEITH (NEEDLE) IMPLANT
NDL SAFETY ECLIPSE 18X1.5 (NEEDLE) ×1 IMPLANT
NDL SPNL 18GX3.5 QUINCKE PK (NEEDLE) ×1 IMPLANT
NEEDLE HYPO 18GX1.5 SHARP (NEEDLE)
NEEDLE HYPO 25X1 1.5 SAFETY (NEEDLE) ×6 IMPLANT
NEEDLE KEITH (NEEDLE) ×3 IMPLANT
NEEDLE SPNL 18GX3.5 QUINCKE PK (NEEDLE) ×3 IMPLANT
NS IRRIG 1000ML POUR BTL (IV SOLUTION) ×6 IMPLANT
PACK BASIN DAY SURGERY FS (CUSTOM PROCEDURE TRAY) ×3 IMPLANT
PENCIL SMOKE EVACUATOR (MISCELLANEOUS) ×3 IMPLANT
PIN SAFETY STERILE (MISCELLANEOUS) ×1 IMPLANT
SCRUB TECHNI CARE 4 OZ NO DYE (MISCELLANEOUS) ×3 IMPLANT
SLEEVE SCD COMPRESS KNEE MED (MISCELLANEOUS) ×3 IMPLANT
SPONGE LAP 18X18 RF (DISPOSABLE) ×6 IMPLANT
STAPLER VISISTAT 35W (STAPLE) ×6 IMPLANT
STRIP CLOSURE SKIN 1/2X4 (GAUZE/BANDAGES/DRESSINGS) ×4 IMPLANT
SUT ETHILON 3 0 PS 1 (SUTURE) ×1 IMPLANT
SUT GORETEX TH-18 36 INCH (SUTURE) ×6 IMPLANT
SUT MNCRL AB 3-0 PS2 18 (SUTURE) ×8 IMPLANT
SUT MNCRL AB 4-0 PS2 18 (SUTURE) ×4 IMPLANT
SUT MON AB 5-0 PS2 18 (SUTURE) ×4 IMPLANT
SUT PROLENE 2 0 CT2 30 (SUTURE) ×7 IMPLANT
SUT PROLENE 3 0 PS 1 (SUTURE) ×2 IMPLANT
SUT VLOC 90 P-14 23 (SUTURE) ×2 IMPLANT
SYR BULB IRRIGATION 50ML (SYRINGE) ×6 IMPLANT
SYR CONTROL 10ML LL (SYRINGE) ×6 IMPLANT
TAPE MEASURE VINYL STERILE (MISCELLANEOUS) ×3 IMPLANT
TOWEL GREEN STERILE FF (TOWEL DISPOSABLE) ×9 IMPLANT
TRAY DSU PREP LF (CUSTOM PROCEDURE TRAY) ×3 IMPLANT
TRAY FOL W/BAG SLVR 16FR STRL (SET/KITS/TRAYS/PACK) ×1 IMPLANT
TRAY FOLEY W/BAG SLVR 14FR LF (SET/KITS/TRAYS/PACK) IMPLANT
TRAY FOLEY W/BAG SLVR 16FR LF (SET/KITS/TRAYS/PACK)
TUBE CONNECTING 20'X1/4 (TUBING) ×1
TUBE CONNECTING 20X1/4 (TUBING) ×2 IMPLANT
UNDERPAD 30X36 HEAVY ABSORB (UNDERPADS AND DIAPERS) ×6 IMPLANT
YANKAUER SUCT BULB TIP NO VENT (SUCTIONS) ×3 IMPLANT

## 2019-07-13 NOTE — Anesthesia Procedure Notes (Signed)
Procedure Name: Intubation Date/Time: 07/13/2019 7:54 AM Performed by: Signe Colt, CRNA Pre-anesthesia Checklist: Patient identified, Emergency Drugs available, Suction available and Patient being monitored Patient Re-evaluated:Patient Re-evaluated prior to induction Oxygen Delivery Method: Circle system utilized Preoxygenation: Pre-oxygenation with 100% oxygen Induction Type: IV induction Ventilation: Mask ventilation without difficulty Laryngoscope Size: Mac and 3 Grade View: Grade I Tube type: Oral Number of attempts: 1 Airway Equipment and Method: Stylet and Oral airway Placement Confirmation: ETT inserted through vocal cords under direct vision,  positive ETCO2 and breath sounds checked- equal and bilateral Secured at: 21 cm Tube secured with: Tape Dental Injury: Teeth and Oropharynx as per pre-operative assessment

## 2019-07-13 NOTE — Anesthesia Postprocedure Evaluation (Signed)
Anesthesia Post Note  Patient: [name]  Procedure(s) Performed: LEFT BREAST MASTOPEXY (Left Breast)     Patient location during evaluation: PACU Anesthesia Type: General Level of consciousness: awake and alert Pain management: pain level controlled Vital Signs Assessment: post-procedure vital signs reviewed and stable Respiratory status: spontaneous breathing, nonlabored ventilation and respiratory function stable Cardiovascular status: blood pressure returned to baseline and stable Postop Assessment: no apparent nausea or vomiting Anesthetic complications: no    Last Vitals:  Vitals:   07/13/19 1230 07/13/19 1245  BP: (!) 148/92 140/75  Pulse: 97 98  Resp: 13 16  Temp:  36.9 C  SpO2: 96% 99%    Last Pain:  Vitals:   07/13/19 1245  TempSrc:   PainSc: 0-No pain                 Catalina Gravel

## 2019-07-13 NOTE — Anesthesia Preprocedure Evaluation (Addendum)
Anesthesia Evaluation  Patient identified by MRN, date of birth, ID band Patient awake    Reviewed: Allergy & Precautions, NPO status , Patient's Chart, lab work & pertinent test results  History of Anesthesia Complications (+) PONV and history of anesthetic complications  Airway Mallampati: II  TM Distance: >3 FB Neck ROM: Full    Dental  (+) Teeth Intact, Dental Advisory Given   Pulmonary former smoker,    Pulmonary exam normal breath sounds clear to auscultation       Cardiovascular negative cardio ROS Normal cardiovascular exam Rhythm:Regular Rate:Normal     Neuro/Psych  Headaches,    GI/Hepatic Neg liver ROS, GERD  ,  Endo/Other  negative endocrine ROS  Renal/GU negative Renal ROS     Musculoskeletal negative musculoskeletal ROS (+)   Abdominal   Peds  Hematology negative hematology ROS (+)   Anesthesia Other Findings Day of surgery medications reviewed with the patient.  Right breast cancer   Reproductive/Obstetrics                             Anesthesia Physical Anesthesia Plan  ASA: II  Anesthesia Plan: General   Post-op Pain Management:    Induction: Intravenous  PONV Risk Score and Plan: 4 or greater and Midazolam, Scopolamine patch - Pre-op, Diphenhydramine, Propofol infusion, Dexamethasone and Ondansetron  Airway Management Planned: Oral ETT  Additional Equipment:   Intra-op Plan:   Post-operative Plan: Extubation in OR  Informed Consent: I have reviewed the patients History and Physical, chart, labs and discussed the procedure including the risks, benefits and alternatives for the proposed anesthesia with the patient or authorized representative who has indicated his/her understanding and acceptance.     Dental advisory given  Plan Discussed with: CRNA  Anesthesia Plan Comments:        Anesthesia Quick Evaluation

## 2019-07-13 NOTE — Brief Op Note (Signed)
07/13/2019  11:35 AM  PATIENT:  [name]  58 y.o. female  PRE-OPERATIVE DIAGNOSIS:  1) HISTORY OF RIGHT BREAST CANCER 2) BREAST ASSYMETRY S/P RIGHT BREAST RECONSTRUCTION 3) LEFT BREAST PTOSIS  POST-OPERATIVE DIAGNOSIS: 1) HISTORY OF RIGHT BREAST CANCER 2) BREAST ASSYMETRY S/P RIGHT BREAST RECONSTRUCTION 3) LEFT BREAST PTOSIS  PROCEDURE:  Procedure(s): LEFT BREAST MASTOPEXY (Left)  SURGEON:  Surgeon(s) and Role:    * Trevor Duty, Audrea Muscat, MD - Primary  ANESTHESIA:   general  EBL:  Minimal   BLOOD ADMINISTERED:none  DRAINS: none   LOCAL MEDICATIONS USED:  1.3% Exparel (total 266 mgs.)  SPECIMEN:  Source of Specimen:  Left Breast  DISPOSITION OF SPECIMEN:  PATHOLOGY  COUNTS:  YES  DICTATION: .Other Dictation: Dictation Number [id number]  PLAN OF CARE: Discharge to home after PACU  PATIENT DISPOSITION:  PACU - hemodynamically stable.   Delay start of Pharmacological VTE agent (>24hrs) due to surgical blood loss or risk of bleeding: not applicable

## 2019-07-13 NOTE — H&P (Signed)
  H&P faxed to surgical center.  -History and Physical Reviewed  -Patient has been re-examined  -No change in the plan of care  [name],[name]    

## 2019-07-13 NOTE — Discharge Instructions (Signed)
1. No lifting greater than 5 lbs with arms for 4 weeks. 2. Percocet 5/325 mg tabs 1-2 tabs po q 4-6 hours prn pain- prescription given in office. 3. Duricef 1 tab po bid- prescription given in office. 4. Sterapred dose pack as directed- prescription given in office. 5. Follow-up appointment Friday in office.    May take next dose of Tylenol at 1:00 PM on 07/13/2019 if needed.  May take next dose of Ibuprofen at 3:00 PM on 07/13/2019 if needed.    Post Anesthesia Home Care Instructions  Activity: Get plenty of rest for the remainder of the day. A responsible individual must stay with you for 24 hours following the procedure.  For the next 24 hours, DO NOT: -Drive a car -Paediatric nurse -Drink alcoholic beverages -Take any medication unless instructed by your physician -Make any legal decisions or sign important papers.  Meals: Start with liquid foods such as gelatin or soup. Progress to regular foods as tolerated. Avoid greasy, spicy, heavy foods. If nausea and/or vomiting occur, drink only clear liquids until the nausea and/or vomiting subsides. Call your physician if vomiting continues.  Special Instructions/Symptoms: Your throat may feel dry or sore from the anesthesia or the breathing tube placed in your throat during surgery. If this causes discomfort, gargle with warm salt water. The discomfort should disappear within 24 hours.  If you had a scopolamine patch placed behind your ear for the management of post- operative nausea and/or vomiting:  1. The medication in the patch is effective for 72 hours, after which it should be removed.  Wrap patch in a tissue and discard in the trash. Wash hands thoroughly with soap and water. 2. You may remove the patch earlier than 72 hours if you experience unpleasant side effects which may include dry mouth, dizziness or visual disturbances. 3. Avoid touching the patch. Wash your hands with soap and water after contact with the patch.       Information for Discharge Teaching: EXPAREL (bupivacaine liposome injectable suspension)   Your surgeon or anesthesiologist gave you EXPAREL(bupivacaine) to help control your pain after surgery.   EXPAREL is a local anesthetic that provides pain relief by numbing the tissue around the surgical site.  EXPAREL is designed to release pain medication over time and can control pain for up to 72 hours.  Depending on how you respond to EXPAREL, you may require less pain medication during your recovery.  Possible side effects:  Temporary loss of sensation or ability to move in the area where bupivacaine was injected.  Nausea, vomiting, constipation  Rarely, numbness and tingling in your mouth or lips, lightheadedness, or anxiety may occur.  Call your doctor right away if you think you may be experiencing any of these sensations, or if you have other questions regarding possible side effects.  Follow all other discharge instructions given to you by your surgeon or nurse. Eat a healthy diet and drink plenty of water or other fluids.  If you return to the hospital for any reason within 96 hours following the administration of EXPAREL, it is important for health care providers to know that you have received this anesthetic. A teal colored band has been placed on your arm with the date, time and amount of EXPAREL you have received in order to alert and inform your health care providers. Please leave this armband in place for the full 96 hours following administration, and then you may remove the band.

## 2019-07-13 NOTE — Transfer of Care (Signed)
Immediate Anesthesia Transfer of Care Note  Patient: [name]  Procedure(s) Performed: LEFT BREAST MASTOPEXY (Left Breast)  Patient Location: PACU  Anesthesia Type:General  Level of Consciousness: awake, alert , oriented and patient cooperative  Airway & Oxygen Therapy: Patient Spontanous Breathing and Patient connected to face mask oxygen  Post-op Assessment: Report given to RN and Post -op Vital signs reviewed and stable  Post vital signs: Reviewed and stable  Last Vitals:  Vitals Value Taken Time  BP    Temp    Pulse 102 07/13/19 1135  Resp 14 07/13/19 1135  SpO2 100 % 07/13/19 1135  Vitals shown include unvalidated device data.  Last Pain:  Vitals:   07/13/19 0644  TempSrc: Oral      Patients Stated Pain Goal: 1 ([id number])  Complications: No apparent anesthesia complications

## 2019-07-14 ENCOUNTER — Encounter (HOSPITAL_BASED_OUTPATIENT_CLINIC_OR_DEPARTMENT_OTHER): Payer: Self-pay | Admitting: Plastic Surgery

## 2019-07-14 LAB — SURGICAL PATHOLOGY

## 2019-07-22 DIAGNOSIS — R7303 Prediabetes: Secondary | ICD-10-CM | POA: Diagnosis not present

## 2019-07-22 DIAGNOSIS — E782 Mixed hyperlipidemia: Secondary | ICD-10-CM | POA: Diagnosis not present

## 2019-07-22 DIAGNOSIS — E010 Iodine-deficiency related diffuse (endemic) goiter: Secondary | ICD-10-CM | POA: Diagnosis not present

## 2019-07-22 DIAGNOSIS — Z0000 Encounter for general adult medical examination without abnormal findings: Secondary | ICD-10-CM | POA: Diagnosis not present

## 2019-08-16 DIAGNOSIS — R300 Dysuria: Secondary | ICD-10-CM | POA: Diagnosis not present

## 2019-08-16 DIAGNOSIS — N3000 Acute cystitis without hematuria: Secondary | ICD-10-CM | POA: Diagnosis not present

## 2019-08-20 NOTE — Op Note (Signed)
NAME: [name], [name] MEDICAL RECORD [id number] ACCOUNT [id number] DATE OF BIRTH:07/14/1961 FACILITY: [facility] LOCATION: [facility] PHYSICIAN:Cyndee Giammarco Jennye Boroughs, MD  OPERATIVE REPORT  DATE OF PROCEDURE:  07/13/2019  PREOPERATIVE DIAGNOSES: 1.  Breast Asymmetry status post treatment of Right Breast Cancer. 2.  Previous history of Lumpectomy, Right Breast for Breast Cancer followed by radiation and chemotherapy. 3.  Status post Right Breast Reconstruction with Fat Grafting for volume restoration of the Right Breast.  POSTOPERATIVE DIAGNOSES:   1.  Breast Asymmetry status post treatment of Right Breast Cancer. 2.  Previous history of Lumpectomy, Right Breast for Breast Cancer followed by radiation and chemotherapy. 3.  Status post Right Breast Reconstruction with Fat Grafting for volume restoration of the Right Breast.  PROCEDURE:  Left Wise Mastopexy to correct Breast Asymmetry.  ANESTHESIA:  General.  DESCRIPTION OF PROCEDURE:  The patient was marked in the preoperative holding area for the mastopexy.  She was then taken back to the OR, placed on the table in supine position.  After adequate general anesthesia was obtained, the patient's chest was  prepped with Hibiclens and draped in a sterile fashion.  The base of the left breast was then infiltrated with 1% lidocaine with epinephrine.  After adequate hemostasis and anesthesia had taken effect, the procedure was begun.  In order to provide better symmetry between the right and left breast I did proceed with a Wise mastopexy.  The patient did have a history of right breast cancer, which had been treated with a lumpectomy followed by radiation therapy and chemotherapy.  I  then performed several sessions of fat grafting to restore the volume to the right breast.  At this point, the left breast had significant ptosis compared to the right breast.  The volume was fairly close, but it is a little bit more difficult to judge  due to the  ptosis of the left breast compared to the right.  At this point, we decided to proceed with a mastopexy because she did not want to consider implants at this time.  I then proceeded with the left Wise mastopexy.  Incisions were made as marked  around the nipple areolar complex as well over the rest of the breast.  The skin was then deepithelialized around the nipple areolar complex down to the inframammary crease in the inferior pedicle pattern.  Next, the medial, superior and lateral skin  flaps were elevated down to the chest wall.  I did not remove any breast tissue with this surgery except for a small amount through the central inferior pedicle, which may give her recurrent ptosis.  However, this was more of a reshaping procedure for  the patient.  The wound was then irrigated with saline irrigation.  Meticulous hemostasis was obtained with the Bovie electrocautery.  The inferior pedicle was then centralized using a 3-0 Prolene suture.  1.3% Exparel (266 mg total) was then used to  infiltrate the pectoral muscles as well as the breast soft tissue for postoperative pain control.  A #10-French JP drain was placed into the wound.  The incisions were then sutured in place.  A 2-0 Prolene suture was used to bring the inverted T junction  together.  The dermal layers were closed using 3-0 Monocryl suture, followed by a 3-0 Monocryl suture from the lateral aspect of the JP drain to edge lateral end of the [facility] incision as asubcuticular stitch as well.  A 3-0 V-Loc barbed suture was then used to close the subcuticular  layer from the  medial aspect of the Sylvia incision to the medial aspect of the JP drain.  At this point, the patient was then placed into the upright position.  It appeared that she had very good symmetry between her breasts; however, the upper pole of the  left breast may have been just a little deficient in volume compared to the right breast but no tissue was removed from the upper pole of the left  breast.  However, we will wait and see how this settles out postoperatively. If necessary perhaps fat grafting here may be an option in the future if needed.  At the beginning of the procedure, the 45 mm nipple marker had been used to circumcise the left nipple  areolar complex and at this point, the 42 mm nipple marker was used for proper placement and location of the nipple areolar complex in the upright position.  She was then placed back into the supine position.  This area of the future nipple areolar complex site was then  incised full thickness through the skin into the subcutaneous tissues.  The nipple areolar complex was then examined, found to be pink and viable and brought out through this aperture and sewn in place using 4-0 Monocryl in the dermal layer followed by  5-0 Monocryl running intracuticular stitch, which was then brought down to close the subcuticular layer of the vertical scar as well.  There were no complications.  The patient tolerated the procedure well.  The incisions were then dressed by using  Steri-Strips to square out the nipple areolar complex and then Steri-Strips on the incisions as well.  The nipple areolar complex was additionally dressed with bacitracin ointment and Adaptic.  4 x 4 gauze was placed over the entire breast incisional  area.  The JP drain had been sutured in place using 3-0 nylon suture.  The patient was then placed into a light postoperative support bra.  Again, there were no complications.  She tolerated the procedure well.  The patient was then recovered without complications.  Proper discharge instructions were given to her family.  The patient was then discharged home to her family in stable condition.  Followup appointment will be within a few days in the office.  CN/NUANCE  D:08/20/2019 T:08/20/2019 JOB:[id number]

## 2019-09-21 DIAGNOSIS — Z1231 Encounter for screening mammogram for malignant neoplasm of breast: Secondary | ICD-10-CM | POA: Diagnosis not present

## 2019-09-21 DIAGNOSIS — Z853 Personal history of malignant neoplasm of breast: Secondary | ICD-10-CM | POA: Diagnosis not present

## 2019-12-21 ENCOUNTER — Ambulatory Visit: Payer: Self-pay | Admitting: Plastic Surgery

## 2020-01-20 ENCOUNTER — Ambulatory Visit: Payer: Self-pay | Admitting: Plastic Surgery

## 2020-01-24 ENCOUNTER — Encounter (HOSPITAL_BASED_OUTPATIENT_CLINIC_OR_DEPARTMENT_OTHER): Payer: Self-pay | Admitting: Plastic Surgery

## 2020-01-24 ENCOUNTER — Other Ambulatory Visit: Payer: Self-pay

## 2020-01-27 ENCOUNTER — Other Ambulatory Visit (HOSPITAL_COMMUNITY)
Admission: RE | Admit: 2020-01-27 | Discharge: 2020-01-27 | Disposition: A | Discharge status: Home / Self Care | Payer: 59 | Source: Ambulatory Visit | Attending: Plastic Surgery | Admitting: Plastic Surgery

## 2020-01-27 DIAGNOSIS — Z01812 Encounter for preprocedural laboratory examination: Secondary | ICD-10-CM | POA: Insufficient documentation

## 2020-01-27 DIAGNOSIS — Z20822 Contact with and (suspected) exposure to covid-19: Secondary | ICD-10-CM | POA: Insufficient documentation

## 2020-01-27 LAB — SARS CORONAVIRUS 2 (TAT 6-24 HRS): SARS Coronavirus 2: NEGATIVE

## 2020-01-27 NOTE — Progress Notes (Signed)
CHG soap given to patient with instructions to shower night before surgery and am of surgery.

## 2020-01-30 NOTE — Anesthesia Preprocedure Evaluation (Addendum)
Anesthesia Evaluation  Patient identified by MRN, date of birth, ID band Patient awake    Reviewed: Allergy & Precautions, NPO status , Patient's Chart, lab work & pertinent test results  History of Anesthesia Complications (+) PONV and history of anesthetic complications  Airway Mallampati: II  TM Distance: >3 FB Neck ROM: Full    Dental no notable dental hx. (+) Teeth Intact, Dental Advisory Given   Pulmonary former smoker,  10 pack year history, quit 1988   Pulmonary exam normal breath sounds clear to auscultation       Cardiovascular negative cardio ROS Normal cardiovascular exam Rhythm:Regular Rate:Normal     Neuro/Psych negative neurological ROS  negative psych ROS   GI/Hepatic Neg liver ROS, GERD  Controlled,  Endo/Other  negative endocrine ROS  Renal/GU negative Renal ROS  negative genitourinary   Musculoskeletal negative musculoskeletal ROS (+)   Abdominal Normal abdominal exam  (+)   Peds  Hematology negative hematology ROS (+)   Anesthesia Other Findings Hx breast ca   Reproductive/Obstetrics negative OB ROS Postmenopausal                             Anesthesia Physical Anesthesia Plan  ASA: II  Anesthesia Plan: General   Post-op Pain Management:    Induction: Intravenous  PONV Risk Score and Plan: 4 or greater and Ondansetron, Dexamethasone, Propofol infusion, Midazolam, Scopolamine patch - Pre-op and Treatment may vary due to age or medical condition  Airway Management Planned: Oral ETT  Additional Equipment: None  Intra-op Plan:   Post-operative Plan: Extubation in OR  Informed Consent: I have reviewed the patients History and Physical, chart, labs and discussed the procedure including the risks, benefits and alternatives for the proposed anesthesia with the patient or authorized representative who has indicated his/her understanding and acceptance.      Dental advisory given  Plan Discussed with: CRNA  Anesthesia Plan Comments:        Anesthesia Quick Evaluation

## 2020-01-31 ENCOUNTER — Ambulatory Visit (HOSPITAL_BASED_OUTPATIENT_CLINIC_OR_DEPARTMENT_OTHER): Payer: No Typology Code available for payment source | Admitting: Certified Registered"

## 2020-01-31 ENCOUNTER — Ambulatory Visit (HOSPITAL_BASED_OUTPATIENT_CLINIC_OR_DEPARTMENT_OTHER)
Admission: RE | Admit: 2020-01-31 | Discharge: 2020-01-31 | Disposition: A | Discharge status: Home / Self Care | Payer: No Typology Code available for payment source | Attending: Plastic Surgery | Admitting: Plastic Surgery

## 2020-01-31 ENCOUNTER — Encounter (HOSPITAL_BASED_OUTPATIENT_CLINIC_OR_DEPARTMENT_OTHER)
Admission: RE | Disposition: A | Discharge status: Home / Self Care | Payer: Self-pay | Source: Home / Self Care | Attending: Plastic Surgery

## 2020-01-31 ENCOUNTER — Other Ambulatory Visit: Payer: Self-pay

## 2020-01-31 ENCOUNTER — Encounter (HOSPITAL_BASED_OUTPATIENT_CLINIC_OR_DEPARTMENT_OTHER): Payer: Self-pay | Admitting: Plastic Surgery

## 2020-01-31 DIAGNOSIS — Z853 Personal history of malignant neoplasm of breast: Secondary | ICD-10-CM | POA: Diagnosis not present

## 2020-01-31 DIAGNOSIS — Z421 Encounter for breast reconstruction following mastectomy: Secondary | ICD-10-CM | POA: Diagnosis not present

## 2020-01-31 DIAGNOSIS — N6489 Other specified disorders of breast: Secondary | ICD-10-CM | POA: Insufficient documentation

## 2020-01-31 DIAGNOSIS — Z87891 Personal history of nicotine dependence: Secondary | ICD-10-CM | POA: Insufficient documentation

## 2020-01-31 DIAGNOSIS — K219 Gastro-esophageal reflux disease without esophagitis: Secondary | ICD-10-CM | POA: Diagnosis not present

## 2020-01-31 HISTORY — PX: BREAST RECONSTRUCTION: SHX9

## 2020-01-31 HISTORY — PX: LIPOSUCTION WITH LIPOFILLING: SHX6436

## 2020-01-31 SURGERY — RECONSTRUCTION, BREAST
Anesthesia: General | Site: Breast | Laterality: Bilateral

## 2020-01-31 MED ORDER — SCOPOLAMINE 1 MG/3DAYS TD PT72
1.0000 | MEDICATED_PATCH | TRANSDERMAL | Status: DC
  Administered 2020-01-31: 07:00:00 1.5 mg via TRANSDERMAL

## 2020-01-31 MED ORDER — FENTANYL CITRATE (PF) 100 MCG/2ML IJ SOLN: 50.0000 ug | INTRAMUSCULAR | Status: DC | PRN

## 2020-01-31 MED ORDER — EPHEDRINE SULFATE 50 MG/ML IJ SOLN
INTRAMUSCULAR | Status: DC | PRN
Start: 2020-01-31 — End: 2020-01-31
  Administered 2020-01-31 (×4): 10 mg via INTRAVENOUS

## 2020-01-31 MED ORDER — ONDANSETRON HCL 4 MG/2ML IJ SOLN
INTRAMUSCULAR | Status: AC
  Filled 2020-01-31: qty 10

## 2020-01-31 MED ORDER — ACETAMINOPHEN 500 MG PO TABS
ORAL_TABLET | ORAL | Status: AC
  Filled 2020-01-31: qty 2

## 2020-01-31 MED ORDER — PROPOFOL 500 MG/50ML IV EMUL
INTRAVENOUS | Status: DC | PRN
  Administered 2020-01-31: 25 ug/kg/min via INTRAVENOUS

## 2020-01-31 MED ORDER — EPINEPHRINE PF 1 MG/ML IJ SOLN
INTRAMUSCULAR | Status: AC
  Filled 2020-01-31: qty 1

## 2020-01-31 MED ORDER — DEXAMETHASONE SODIUM PHOSPHATE 4 MG/ML IJ SOLN
INTRAMUSCULAR | Status: DC | PRN
  Administered 2020-01-31: 10 mg via INTRAVENOUS

## 2020-01-31 MED ORDER — OXYCODONE HCL 5 MG PO TABS
5.0000 mg | ORAL_TABLET | Freq: Once | ORAL | Status: AC | PRN
  Administered 2020-01-31: 5 mg via ORAL

## 2020-01-31 MED ORDER — ARTIFICIAL TEARS OPHTHALMIC OINT
TOPICAL_OINTMENT | OPHTHALMIC | Status: AC
  Filled 2020-01-31: qty 21

## 2020-01-31 MED ORDER — DEXAMETHASONE SODIUM PHOSPHATE 10 MG/ML IJ SOLN
INTRAMUSCULAR | Status: AC
  Filled 2020-01-31: qty 3

## 2020-01-31 MED ORDER — LACTATED RINGERS IV SOLN: INTRAVENOUS | Status: DC

## 2020-01-31 MED ORDER — OXYCODONE HCL 5 MG PO TABS
ORAL_TABLET | ORAL | Status: AC
  Filled 2020-01-31: qty 1

## 2020-01-31 MED ORDER — ESMOLOL HCL 100 MG/10ML IV SOLN
INTRAVENOUS | Status: AC
  Filled 2020-01-31: qty 10

## 2020-01-31 MED ORDER — SODIUM CHLORIDE (PF) 0.9 % IJ SOLN
INTRAMUSCULAR | Status: AC
  Filled 2020-01-31: qty 20

## 2020-01-31 MED ORDER — PROMETHAZINE HCL 25 MG/ML IJ SOLN: 6.2500 mg | INTRAMUSCULAR | Status: DC | PRN

## 2020-01-31 MED ORDER — OXYCODONE HCL 5 MG/5ML PO SOLN: 5.0000 mg | Freq: Once | ORAL | Status: AC | PRN

## 2020-01-31 MED ORDER — ONDANSETRON HCL 4 MG/2ML IJ SOLN
INTRAMUSCULAR | Status: DC | PRN
  Administered 2020-01-31: 4 mg via INTRAVENOUS

## 2020-01-31 MED ORDER — PHENYLEPHRINE 40 MCG/ML (10ML) SYRINGE FOR IV PUSH (FOR BLOOD PRESSURE SUPPORT)
PREFILLED_SYRINGE | INTRAVENOUS | Status: AC
  Filled 2020-01-31: qty 10

## 2020-01-31 MED ORDER — LIDOCAINE HCL 1 % IJ SOLN
INTRAVENOUS | Status: DC | PRN
  Administered 2020-01-31: 2000 mL

## 2020-01-31 MED ORDER — ROCURONIUM BROMIDE 100 MG/10ML IV SOLN
INTRAVENOUS | Status: DC | PRN
  Administered 2020-01-31: 60 mg via INTRAVENOUS

## 2020-01-31 MED ORDER — LIDOCAINE HCL 1 % IJ SOLN
INTRAVENOUS | Status: DC | PRN
  Administered 2020-01-31: 500 mL

## 2020-01-31 MED ORDER — CEFAZOLIN SODIUM-DEXTROSE 2-4 GM/100ML-% IV SOLN
2.0000 g | INTRAVENOUS | Status: AC
  Administered 2020-01-31: 2 g via INTRAVENOUS

## 2020-01-31 MED ORDER — FENTANYL CITRATE (PF) 100 MCG/2ML IJ SOLN
INTRAMUSCULAR | Status: AC
  Filled 2020-01-31: qty 2

## 2020-01-31 MED ORDER — LIDOCAINE 2% (20 MG/ML) 5 ML SYRINGE
INTRAMUSCULAR | Status: AC
  Filled 2020-01-31: qty 15

## 2020-01-31 MED ORDER — LIDOCAINE-EPINEPHRINE 1 %-1:100000 IJ SOLN
INTRAMUSCULAR | Status: AC
  Filled 2020-01-31: qty 2

## 2020-01-31 MED ORDER — BUPIVACAINE HCL (PF) 0.25 % IJ SOLN
INTRAMUSCULAR | Status: AC
  Filled 2020-01-31: qty 30

## 2020-01-31 MED ORDER — ROCURONIUM BROMIDE 10 MG/ML (PF) SYRINGE
PREFILLED_SYRINGE | INTRAVENOUS | Status: AC
  Filled 2020-01-31: qty 20

## 2020-01-31 MED ORDER — MIDAZOLAM HCL 2 MG/2ML IJ SOLN
INTRAMUSCULAR | Status: AC
  Filled 2020-01-31: qty 2

## 2020-01-31 MED ORDER — ACETAMINOPHEN 500 MG PO TABS
1000.0000 mg | ORAL_TABLET | Freq: Once | ORAL | Status: AC
  Administered 2020-01-31: 07:00:00 1000 mg via ORAL

## 2020-01-31 MED ORDER — CHLORHEXIDINE GLUCONATE CLOTH 2 % EX PADS: 6.0000 | MEDICATED_PAD | Freq: Once | CUTANEOUS | Status: DC

## 2020-01-31 MED ORDER — LIDOCAINE HCL (PF) 1 % IJ SOLN
INTRAMUSCULAR | Status: AC
  Filled 2020-01-31: qty 30

## 2020-01-31 MED ORDER — LIDOCAINE HCL (CARDIAC) PF 100 MG/5ML IV SOSY
PREFILLED_SYRINGE | INTRAVENOUS | Status: DC | PRN
  Administered 2020-01-31: 60 mg via INTRAVENOUS

## 2020-01-31 MED ORDER — CEFAZOLIN SODIUM-DEXTROSE 2-4 GM/100ML-% IV SOLN
INTRAVENOUS | Status: AC
  Filled 2020-01-31: qty 100

## 2020-01-31 MED ORDER — DEXMEDETOMIDINE HCL IN NACL 200 MCG/50ML IV SOLN
INTRAVENOUS | Status: AC
  Filled 2020-01-31: qty 50

## 2020-01-31 MED ORDER — PROPOFOL 10 MG/ML IV BOLUS
INTRAVENOUS | Status: DC | PRN
  Administered 2020-01-31: 150 mg via INTRAVENOUS

## 2020-01-31 MED ORDER — LIDOCAINE-EPINEPHRINE 1 %-1:100000 IJ SOLN
INTRAMUSCULAR | Status: DC | PRN
  Administered 2020-01-31: 8 mL

## 2020-01-31 MED ORDER — KETOROLAC TROMETHAMINE 30 MG/ML IJ SOLN: 30.0000 mg | Freq: Once | INTRAMUSCULAR | Status: DC | PRN

## 2020-01-31 MED ORDER — BACITRACIN ZINC 500 UNIT/GM EX OINT
TOPICAL_OINTMENT | CUTANEOUS | Status: AC
  Filled 2020-01-31: qty 28.35

## 2020-01-31 MED ORDER — SUGAMMADEX SODIUM 200 MG/2ML IV SOLN
INTRAVENOUS | Status: DC | PRN
Start: 2020-01-31 — End: 2020-01-31
  Administered 2020-01-31: 200 mg via INTRAVENOUS

## 2020-01-31 MED ORDER — SCOPOLAMINE 1 MG/3DAYS TD PT72
MEDICATED_PATCH | TRANSDERMAL | Status: AC
  Filled 2020-01-31: qty 1

## 2020-01-31 MED ORDER — EPINEPHRINE PF 1 MG/ML IJ SOLN
INTRAMUSCULAR | Status: AC
  Filled 2020-01-31: qty 2

## 2020-01-31 MED ORDER — MEPERIDINE HCL 25 MG/ML IJ SOLN: 6.2500 mg | INTRAMUSCULAR | Status: DC | PRN

## 2020-01-31 MED ORDER — FENTANYL CITRATE (PF) 100 MCG/2ML IJ SOLN
INTRAMUSCULAR | Status: DC | PRN
  Administered 2020-01-31 (×2): 50 ug via INTRAVENOUS

## 2020-01-31 MED ORDER — HYDROMORPHONE HCL 1 MG/ML IJ SOLN: 0.2500 mg | INTRAMUSCULAR | Status: DC | PRN

## 2020-01-31 MED ORDER — LIDOCAINE HCL (PF) 1 % IJ SOLN
INTRAMUSCULAR | Status: AC
  Filled 2020-01-31: qty 60

## 2020-01-31 MED ORDER — SUGAMMADEX SODIUM 500 MG/5ML IV SOLN
INTRAVENOUS | Status: AC
  Filled 2020-01-31: qty 5

## 2020-01-31 MED ORDER — LACTATED RINGERS IV SOLN
INTRAVENOUS | Status: AC | PRN
  Administered 2020-01-31: 3000 mL

## 2020-01-31 MED ORDER — PROPOFOL 500 MG/50ML IV EMUL
INTRAVENOUS | Status: AC
  Filled 2020-01-31: qty 150

## 2020-01-31 MED ORDER — SODIUM CHLORIDE 0.9 % IV SOLN
INTRAVENOUS | Status: AC
  Filled 2020-01-31: qty 500000

## 2020-01-31 MED ORDER — EPHEDRINE 5 MG/ML INJ
INTRAVENOUS | Status: AC
  Filled 2020-01-31: qty 30

## 2020-01-31 MED ORDER — MIDAZOLAM HCL 5 MG/5ML IJ SOLN
INTRAMUSCULAR | Status: DC | PRN
  Administered 2020-01-31: 2 mg via INTRAVENOUS

## 2020-01-31 MED ORDER — MIDAZOLAM HCL 2 MG/2ML IJ SOLN: 1.0000 mg | INTRAMUSCULAR | Status: DC | PRN

## 2020-01-31 SURGICAL SUPPLY — 88 items
APL SKNCLS STERI-STRIP NONHPOA (GAUZE/BANDAGES/DRESSINGS) ×4
BENZOIN TINCTURE PRP APPL 2/3 (GAUZE/BANDAGES/DRESSINGS) ×6 IMPLANT
BINDER ABDOMINAL  9 SM 30-45 (SOFTGOODS) ×3
BINDER ABDOMINAL 10 UNV 27-48 (MISCELLANEOUS) IMPLANT
BINDER ABDOMINAL 12 SM 30-45 (SOFTGOODS) IMPLANT
BINDER ABDOMINAL 9 SM 30-45 (SOFTGOODS) IMPLANT
BINDER BREAST LRG (GAUZE/BANDAGES/DRESSINGS) IMPLANT
BINDER BREAST XLRG (GAUZE/BANDAGES/DRESSINGS) ×1 IMPLANT
BLADE HEX COATED 2.75 (ELECTRODE) ×2 IMPLANT
BLADE KNIFE PERSONA 10 (BLADE) ×2 IMPLANT
BLADE KNIFE PERSONA 15 (BLADE) ×5 IMPLANT
BNDG GAUZE ELAST 4 BULKY (GAUZE/BANDAGES/DRESSINGS) ×2 IMPLANT
CANISTER LIPO FAT HARVEST (MISCELLANEOUS) ×3 IMPLANT
CANISTER SUCT 1200ML W/VALVE (MISCELLANEOUS) ×3 IMPLANT
CAP BOUFFANT 24 BLUE NURSES (PROTECTIVE WEAR) ×3 IMPLANT
COVER BACK TABLE 60X90IN (DRAPES) ×3 IMPLANT
COVER MAYO STAND STRL (DRAPES) ×4 IMPLANT
COVER WAND RF STERILE (DRAPES) IMPLANT
DECANTER SPIKE VIAL GLASS SM (MISCELLANEOUS) ×6 IMPLANT
DRAIN CHANNEL 10M FLAT 3/4 FLT (DRAIN) IMPLANT
DRAIN CHANNEL 7F 3/4 FLAT (WOUND CARE) IMPLANT
DRAPE LAPAROSCOPIC ABDOMINAL (DRAPES) ×2 IMPLANT
DRAPE U-SHAPE 76X120 STRL (DRAPES) ×6 IMPLANT
DRSG EMULSION OIL 3X3 NADH (GAUZE/BANDAGES/DRESSINGS) IMPLANT
DRSG PAD ABDOMINAL 8X10 ST (GAUZE/BANDAGES/DRESSINGS) ×6 IMPLANT
DRSG TELFA 3X8 NADH (GAUZE/BANDAGES/DRESSINGS) IMPLANT
ELECT REM PT RETURN 9FT ADLT (ELECTROSURGICAL) ×3
ELECTRODE REM PT RTRN 9FT ADLT (ELECTROSURGICAL) ×2 IMPLANT
EVACUATOR SILICONE 100CC (DRAIN) IMPLANT
EXTRACTOR CANIST REVOLVE STRL (CANNISTER) ×1 IMPLANT
FILTER 7/8 IN (FILTER) ×3 IMPLANT
GAUZE SPONGE 4X4 12PLY STRL (GAUZE/BANDAGES/DRESSINGS) ×5 IMPLANT
GLOVE BIOGEL PI IND STRL 6.5 (GLOVE) IMPLANT
GLOVE BIOGEL PI IND STRL 7.0 (GLOVE) IMPLANT
GLOVE BIOGEL PI INDICATOR 6.5 (GLOVE) ×2
GLOVE BIOGEL PI INDICATOR 7.0 (GLOVE) ×2
GLOVE SURG SS PI 6.5 STRL IVOR (GLOVE) ×9 IMPLANT
GLOVE SURG SS PI 7.0 STRL IVOR (GLOVE) ×7 IMPLANT
GLOVE SURG SS PI 7.5 STRL IVOR (GLOVE) ×1 IMPLANT
GOWN STRL REUS W/ TWL LRG LVL3 (GOWN DISPOSABLE) ×4 IMPLANT
GOWN STRL REUS W/TWL LRG LVL3 (GOWN DISPOSABLE) ×9
IV NS 500ML (IV SOLUTION)
IV NS 500ML BAXH (IV SOLUTION) IMPLANT
LINER CANISTER 1000CC FLEX (MISCELLANEOUS) ×3 IMPLANT
MANIFOLD NEPTUNE II (INSTRUMENTS) IMPLANT
MARKER SKIN DUAL TIP RULER LAB (MISCELLANEOUS) ×3 IMPLANT
NDL HYPO 25X1 1.5 SAFETY (NEEDLE) ×4 IMPLANT
NDL SAFETY ECLIPSE 18X1.5 (NEEDLE) IMPLANT
NDL SPNL 18GX3.5 QUINCKE PK (NEEDLE) ×2 IMPLANT
NEEDLE HYPO 18GX1.5 SHARP (NEEDLE)
NEEDLE HYPO 25X1 1.5 SAFETY (NEEDLE) ×6 IMPLANT
NEEDLE SPNL 18GX3.5 QUINCKE PK (NEEDLE) ×3 IMPLANT
NS IRRIG 1000ML POUR BTL (IV SOLUTION) ×5 IMPLANT
PAD DRESSING TELFA 3X8 NADH (GAUZE/BANDAGES/DRESSINGS) IMPLANT
PENCIL SMOKE EVACUATOR (MISCELLANEOUS) ×3 IMPLANT
PIN SAFETY STERILE (MISCELLANEOUS) IMPLANT
SCRUB TECHNI CARE 4 OZ NO DYE (MISCELLANEOUS) ×3 IMPLANT
SET BASIN DAY SURGERY F.S. (CUSTOM PROCEDURE TRAY) ×3 IMPLANT
SHEET MEDIUM DRAPE 40X70 STRL (DRAPES) ×6 IMPLANT
SLEEVE SCD COMPRESS KNEE MED (MISCELLANEOUS) ×3 IMPLANT
SPONGE LAP 18X18 RF (DISPOSABLE) ×11 IMPLANT
STAPLER VISISTAT 35W (STAPLE) ×4 IMPLANT
STRIP CLOSURE SKIN 1/2X4 (GAUZE/BANDAGES/DRESSINGS) ×6 IMPLANT
SUT ETHIBOND 3-0 V-5 (SUTURE) IMPLANT
SUT ETHILON 3 0 PS 1 (SUTURE) ×3 IMPLANT
SUT MERSILENE 2.0 SH NDLE (SUTURE) IMPLANT
SUT MNCRL AB 3-0 PS2 18 (SUTURE) ×2 IMPLANT
SUT MNCRL AB 4-0 PS2 18 (SUTURE) ×5 IMPLANT
SUT MON AB 5-0 PS2 18 (SUTURE) IMPLANT
SUT PROLENE 3 0 PS 1 (SUTURE) IMPLANT
SUT SILK 2 0 PERMA HAND 18 BK (SUTURE) ×1 IMPLANT
SUT VLOC 90 P-14 23 (SUTURE) IMPLANT
SYR 20ML LL LF (SYRINGE) ×5 IMPLANT
SYR 50ML LL SCALE MARK (SYRINGE) ×3 IMPLANT
SYR BULB IRRIGATION 50ML (SYRINGE) ×6 IMPLANT
SYR CONTROL 10ML LL (SYRINGE) ×8 IMPLANT
SYR TOOMEY 50ML (SYRINGE) ×12 IMPLANT
TAPE MEASURE VINYL STERILE (MISCELLANEOUS) ×3 IMPLANT
TOWEL GREEN STERILE FF (TOWEL DISPOSABLE) ×9 IMPLANT
TRAY DSU PREP LF (CUSTOM PROCEDURE TRAY) ×3 IMPLANT
TRAY FOL W/BAG SLVR 16FR STRL (SET/KITS/TRAYS/PACK) IMPLANT
TRAY FOLEY W/BAG SLVR 14FR LF (SET/KITS/TRAYS/PACK) IMPLANT
TRAY FOLEY W/BAG SLVR 16FR LF (SET/KITS/TRAYS/PACK)
TUBE CONNECTING 20X1/4 (TUBING) ×3 IMPLANT
TUBING INFILTRATION IT-10001 (TUBING) IMPLANT
TUBING SET GRADUATE ASPIR 12FT (MISCELLANEOUS) ×3 IMPLANT
UNDERPAD 30X36 HEAVY ABSORB (UNDERPADS AND DIAPERS) ×6 IMPLANT
YANKAUER SUCT BULB TIP NO VENT (SUCTIONS) ×3 IMPLANT

## 2020-01-31 NOTE — Transfer of Care (Signed)
Immediate Anesthesia Transfer of Care Note  Patient: [name]  Procedure(s) Performed: BREAST RECONSTRUCTION (Bilateral Breast) LIPOSUCTION WITH LIPOFILLING (Bilateral Abdomen)  Patient Location: PACU  Anesthesia Type:General  Level of Consciousness: drowsy and patient cooperative  Airway & Oxygen Therapy: Patient Spontanous Breathing and Patient connected to face mask oxygen  Post-op Assessment: Report given to RN and Post -op Vital signs reviewed and stable  Post vital signs: Reviewed and stable  Last Vitals:  Vitals Value Taken Time  BP 121/57 01/31/20 1201  Temp    Pulse 77 01/31/20 1202  Resp 19 01/31/20 1202  SpO2 100 % 01/31/20 1202  Vitals shown include unvalidated device data.  Last Pain:  Vitals:   01/31/20 0642  TempSrc: Tympanic  PainSc: 0-No pain         Complications: No apparent anesthesia complications

## 2020-01-31 NOTE — Anesthesia Postprocedure Evaluation (Signed)
Anesthesia Post Note  Patient: [name]  Procedure(s) Performed: BREAST RECONSTRUCTION (Bilateral Breast) LIPOSUCTION WITH LIPOFILLING (Bilateral Abdomen)     Patient location during evaluation: PACU Anesthesia Type: General Level of consciousness: awake and alert, oriented and patient cooperative Pain management: pain level controlled Vital Signs Assessment: post-procedure vital signs reviewed and stable Respiratory status: spontaneous breathing, nonlabored ventilation and respiratory function stable Cardiovascular status: blood pressure returned to baseline and stable Postop Assessment: no apparent nausea or vomiting Anesthetic complications: no    Last Vitals:  Vitals:   01/31/20 1201 01/31/20 1202  BP: (!) 121/57   Pulse:  77  Resp:  19  Temp:  36.5 C  SpO2:  100%    Last Pain:  Vitals:   01/31/20 1202  TempSrc:   PainSc: [place]

## 2020-01-31 NOTE — Anesthesia Procedure Notes (Signed)
Procedure Name: Intubation Date/Time: 01/31/2020 8:01 AM Performed by: Signe Colt, CRNA Pre-anesthesia Checklist: Patient identified, Emergency Drugs available, Suction available and Patient being monitored Patient Re-evaluated:Patient Re-evaluated prior to induction Oxygen Delivery Method: Circle system utilized Preoxygenation: Pre-oxygenation with 100% oxygen Induction Type: IV induction Ventilation: Mask ventilation without difficulty Laryngoscope Size: Mac and 3 Grade View: Grade I Tube type: Oral Tube size: 7.0 mm Number of attempts: 1 Airway Equipment and Method: Stylet and Oral airway Placement Confirmation: ETT inserted through vocal cords under direct vision,  positive ETCO2 and breath sounds checked- equal and bilateral Secured at: 21 cm Tube secured with: Tape Dental Injury: Teeth and Oropharynx as per pre-operative assessment

## 2020-01-31 NOTE — H&P (Signed)
  H&P faxed to surgical center.  -History and Physical Reviewed  -Patient has been re-examined  -No change in the plan of care  [name]    

## 2020-01-31 NOTE — Brief Op Note (Signed)
01/31/2020  11:41 AM  PATIENT:  [name]  59 y.o. female  PRE-OPERATIVE DIAGNOSIS: H/O Right breast cancer  POST-OPERATIVE DIAGNOSIS :H/O Right breast cancer  PROCEDURE:  Procedure(s): BREAST RECONSTRUCTION (Bilateral) LIPOSUCTION WITH LIPOFILLING (Bilateral)  SURGEON:  Surgeon(s) and Role:    * Cypress Fanfan, Audrea Muscat, MD - Primary  ASSISTANTS: Neomia Glass, RNFA  ANESTHESIA:   general  EBL:  Minimal  BLOOD ADMINISTERED:none  DRAINS: none   LOCAL MEDICATIONS USED:  LIDOCAINE   SPECIMEN:  No Specimen  DISPOSITION OF SPECIMEN:  N/A  COUNTS:  YES  DICTATION: .Other Dictation: Dictation Number [id number]  PLAN OF CARE: Discharge to home after PACU  PATIENT DISPOSITION:  PACU - hemodynamically stable.   Delay start of Pharmacological VTE agent (>24hrs) due to surgical blood loss or risk of bleeding: not applicable

## 2020-01-31 NOTE — Discharge Instructions (Signed)
1. No lifting greater than 5 lbs with arms for 4 weeks. 2. Percocet 5/325 mg tabs 1-2 tabs po q 4-6 hours prn pain- prescription given in office. 3. Duricef 1 tab po bid- prescription given in office. 4. Sterapred dose pack as directed- prescription given in office. 5. Follow-up appointment Friday in office.    Post Anesthesia Home Care Instructions  Activity: Get plenty of rest for the remainder of the day. A responsible individual must stay with you for 24 hours following the procedure.  For the next 24 hours, DO NOT: -Drive a car -Paediatric nurse -Drink alcoholic beverages -Take any medication unless instructed by your physician -Make any legal decisions or sign important papers.  Meals: Start with liquid foods such as gelatin or soup. Progress to regular foods as tolerated. Avoid greasy, spicy, heavy foods. If nausea and/or vomiting occur, drink only clear liquids until the nausea and/or vomiting subsides. Call your physician if vomiting continues.  Special Instructions/Symptoms: Your throat may feel dry or sore from the anesthesia or the breathing tube placed in your throat during surgery. If this causes discomfort, gargle with warm salt water. The discomfort should disappear within 24 hours.  If you had a scopolamine patch placed behind your ear for the management of post- operative nausea and/or vomiting:  1. The medication in the patch is effective for 72 hours, after which it should be removed.  Wrap patch in a tissue and discard in the trash. Wash hands thoroughly with soap and water. 2. You may remove the patch earlier than 72 hours if you experience unpleasant side effects which may include dry mouth, dizziness or visual disturbances. 3. Avoid touching the patch. Wash your hands with soap and water after contact with the patch.    Call your surgeon if you experience:   1.  Fever over 101.0. 2.  Inability to urinate. 3.  Nausea and/or vomiting. 4.  Extreme swelling or  bruising at the surgical site. 5.  Continued bleeding from the incision. 6.  Increased pain, redness or drainage from the incision. 7.  Problems related to your pain medication. 8.  Any problems and/or concerns  *May take Tylenol at 1pm today 01/31/20

## 2020-02-01 ENCOUNTER — Encounter: Payer: Self-pay | Admitting: *Deleted

## 2020-03-03 NOTE — Op Note (Signed)
NAME: JANAYE, CORP MEDICAL RECORD [id number] ACCOUNT [id number] DATE OF BIRTH:10/24/1960 FACILITY: [facility] LOCATION: [facility] PHYSICIAN:Wyndham Santilli Jennye Boroughs, MD  OPERATIVE REPORT  DATE OF PROCEDURE:  01/31/2020  PREOPERATIVE DIAGNOSES: 1.  History of right breast cancer. 2.  Status post lumpectomy, right breast for cancer with residual lumpectomy defect. 3.  Breast asymmetry.   4.  Desire for fuller left breast to match the contour of right breast.  POSTOPERATIVE DIAGNOSES:   1.  History of right breast cancer. 2.  Status post lumpectomy, right breast for cancer with residual lumpectomy defect. 3.  Breast asymmetry.   4.  Desire for fuller left breast to match the contour of right breast.  PROCEDURES PERFORMED: 1.  Right breast reconstruction revision with micro fat grafting of the right lumpectomy defect. 2.  Revision of left breast symmetry procedure with micro fat grafting of the left breast. 3.  Liposuction of the abdomen and flanks.  ATTENDING SURGEON:  Youlanda Roys, MD   ANESTHESIA:  General.  COMPLICATIONS:  None.  INDICATIONS:  The patient is a 59 year old Caucasian female who has a history of right breast cancer.  She underwent a right lumpectomy followed by radiation therapy.  As a result, she had developed some significant radiation changes in her right breast  and asymmetry between the 2 breasts.  She has undergone several surgeries in which micro fat grafting of the right breast was performed in order to improve the lumpectomy defect and reconstruct the volume loss from the right breast.  She also has  undergone a mastopexy of the left breast.  However, the patient has some asymmetry left between her breast contours, in particular loss of upper pole fullness to match the upper pole fullness of the right breast.  She therefore would like to undergo  micro fat grafting through liposuction and processing of the fat to the left breast in order to improve the  symmetry of her breast contours.  DESCRIPTION OF PROCEDURE:  The patient was marked in the preop holding area for the future surgery as well as the areas that were to be liposuctioned.  She was then brought into the OR and prior to undergoing anesthesia she was prepped sitting up on the  OR table with Techni-Care.  The patient's entire back and flanks were prepped with Techni-Care and then she was laid down on the sterile sheets.  The prep had actually extended into the axillary and lateral breast areas.  After adequate general  anesthesia was obtained, the prep was then continued to complete a prep from the patient's chin to her pubic area with the Techni-Care.  She was then draped in a sterile fashion.  This was done in preparation of course for the liposuction to obtain the  fat for the micro fat grafting.  After the general anesthesia was obtained we did proceed with the procedure.  Tumescent fluid in the amount as noted on the accompanying nursing notes was then infiltrated into areas on the patient's abdomen as well as flank areas.  After allowing the tumescent fluid to take effect for 20-25 minutes, I then proceeded to use a  power-assisted liposuction to harvest the fat.  The SAFE technique was used for the liposuction.  After enough fat had been harvested it was then processed using the Revolve system.  Once the fat was properly processed, I then used micro fat grafting  techniques to first continued to improve the right lumpectomy defect.  There was still some scar tissue present and  this was sharply divided as much as possible subdermally.  The amount of fat that was injected into the lumpectomy defect is as noted on  the accompanying nursing notes.  I then turned my attention to the left breast.  The left breast needs some significant micro fat grafting to improve the contours over the superior half of the breast.  Using micro fat grafting techniques multiple layers  of the fat were then  infiltrated into the breast tissue.  The total amount is as noted on accompanying nursing notes.  After the fat grafting was completed, she appeared to have better symmetry between the 2 breasts.  Of course, it is possible that all  of the fat was injected will not survive the micro fat grafting procedure so we injected more fat than we thought she needed in order to accommodate for this attrition of the fat that was injected.  However, the contours looked nice and she did have much  better symmetry.  It should be noted that due to the nature of micro fat grafting the patient may need more than 1 fat grafting procedure on the left breast in order to achieve symmetry with the right breast contour.  At this point, the incisions for  the liposuction as well as the micro fat grafting were closed using 3-0 Monocryl in the dermal layer followed by 4-0 Monocryl running intracuticular stitch on the skin.  There were no complications.  The patient tolerated the procedure well.  The  incisions were dressed with Steri-Strips.  She was then placed into an abdominal binder to provide compression for the areas of liposuction.  The patient was also placed into the bra binder to give support to her breasts.  When she gets to the office in  the next few days, we will change her out into a regular supportive bra appropriate for postoperative support for the patient.  The final needle and sponge counts were reported to be correct at the end of the case.  The patient was then awakened from the general anesthesia and taken to the recovery room in stable condition.  She was also recovered without complications.  The patient and her family were given proper postoperative wound care instructions.  The patient  was then discharged home in the care of her family in stable condition.  Followup appointment will be in the office within a few days.  CN/NUANCE  D:03/02/2020 T:03/03/2020 JOB:[id number]

## 2020-06-29 ENCOUNTER — Encounter: Payer: Self-pay | Admitting: Podiatry

## 2020-06-29 ENCOUNTER — Ambulatory Visit (INDEPENDENT_AMBULATORY_CARE_PROVIDER_SITE_OTHER): Payer: 59 | Admitting: Podiatry

## 2020-06-29 ENCOUNTER — Other Ambulatory Visit: Payer: Self-pay

## 2020-06-29 DIAGNOSIS — M779 Enthesopathy, unspecified: Secondary | ICD-10-CM | POA: Diagnosis not present

## 2020-06-29 NOTE — Progress Notes (Signed)
Subjective:   Patient ID: [name], female   DOB: 59 y.o.   MRN: [id number]   HPI Patient presents with quite a bit of discomfort in the joint and stated she had almost 1 year of relief   ROS      Objective:  Physical Exam  Neurovascular status intact with inflammation pain of the third metatarsal phalangeal joint left with fluid buildup around the joint surface with pain     Assessment:  Inflammatory capsulitis third MPJ left with pain     Plan:  Sterile prep done and did aspiration of the third MPJ getting out a small amount of clear fluid and injected quarter cc dexamethasone Kenalog and applied thick padding to reduce pressure on the joint and patient will be seen back to recheck on an as-needed basis informed may require shortening type osteotomy for condition

## 2020-07-10 ENCOUNTER — Ambulatory Visit: Payer: Self-pay | Admitting: Plastic Surgery

## 2020-07-11 ENCOUNTER — Other Ambulatory Visit: Payer: Self-pay

## 2020-07-11 ENCOUNTER — Encounter (HOSPITAL_BASED_OUTPATIENT_CLINIC_OR_DEPARTMENT_OTHER): Payer: Self-pay | Admitting: Plastic Surgery

## 2020-07-14 ENCOUNTER — Other Ambulatory Visit (HOSPITAL_COMMUNITY)
Admission: RE | Admit: 2020-07-14 | Discharge: 2020-07-14 | Disposition: A | Discharge status: Home / Self Care | Payer: No Typology Code available for payment source | Source: Ambulatory Visit | Attending: Plastic Surgery | Admitting: Plastic Surgery

## 2020-07-14 DIAGNOSIS — Z01812 Encounter for preprocedural laboratory examination: Secondary | ICD-10-CM | POA: Insufficient documentation

## 2020-07-14 DIAGNOSIS — Z20822 Contact with and (suspected) exposure to covid-19: Secondary | ICD-10-CM | POA: Diagnosis not present

## 2020-07-15 LAB — SARS CORONAVIRUS 2 (TAT 6-24 HRS): SARS Coronavirus 2: NEGATIVE

## 2020-07-18 ENCOUNTER — Other Ambulatory Visit: Payer: Self-pay

## 2020-07-18 ENCOUNTER — Encounter (HOSPITAL_BASED_OUTPATIENT_CLINIC_OR_DEPARTMENT_OTHER)
Admission: RE | Disposition: A | Discharge status: Home / Self Care | Payer: Self-pay | Source: Home / Self Care | Attending: Plastic Surgery

## 2020-07-18 ENCOUNTER — Encounter (HOSPITAL_BASED_OUTPATIENT_CLINIC_OR_DEPARTMENT_OTHER): Payer: Self-pay | Admitting: Plastic Surgery

## 2020-07-18 ENCOUNTER — Ambulatory Visit (HOSPITAL_BASED_OUTPATIENT_CLINIC_OR_DEPARTMENT_OTHER): Payer: No Typology Code available for payment source | Admitting: Certified Registered"

## 2020-07-18 ENCOUNTER — Ambulatory Visit (HOSPITAL_BASED_OUTPATIENT_CLINIC_OR_DEPARTMENT_OTHER)
Admission: RE | Admit: 2020-07-18 | Discharge: 2020-07-18 | Disposition: A | Discharge status: Home / Self Care | Payer: No Typology Code available for payment source | Attending: Plastic Surgery | Admitting: Plastic Surgery

## 2020-07-18 DIAGNOSIS — Z91040 Latex allergy status: Secondary | ICD-10-CM | POA: Insufficient documentation

## 2020-07-18 DIAGNOSIS — Z803 Family history of malignant neoplasm of breast: Secondary | ICD-10-CM | POA: Insufficient documentation

## 2020-07-18 DIAGNOSIS — N6481 Ptosis of breast: Secondary | ICD-10-CM | POA: Insufficient documentation

## 2020-07-18 DIAGNOSIS — Z87891 Personal history of nicotine dependence: Secondary | ICD-10-CM | POA: Diagnosis not present

## 2020-07-18 DIAGNOSIS — K219 Gastro-esophageal reflux disease without esophagitis: Secondary | ICD-10-CM | POA: Diagnosis not present

## 2020-07-18 DIAGNOSIS — N651 Disproportion of reconstructed breast: Secondary | ICD-10-CM | POA: Diagnosis not present

## 2020-07-18 DIAGNOSIS — C50912 Malignant neoplasm of unspecified site of left female breast: Secondary | ICD-10-CM | POA: Diagnosis not present

## 2020-07-18 DIAGNOSIS — C50911 Malignant neoplasm of unspecified site of right female breast: Secondary | ICD-10-CM | POA: Insufficient documentation

## 2020-07-18 DIAGNOSIS — Z79899 Other long term (current) drug therapy: Secondary | ICD-10-CM | POA: Diagnosis not present

## 2020-07-18 HISTORY — PX: LIPOSUCTION WITH LIPOFILLING: SHX6436

## 2020-07-18 SURGERY — LIPOSUCTION, WITH FAT TRANSFER
Anesthesia: General | Site: Breast | Laterality: Left

## 2020-07-18 MED ORDER — LACTATED RINGERS IV SOLN: INTRAVENOUS | Status: DC

## 2020-07-18 MED ORDER — SUGAMMADEX SODIUM 500 MG/5ML IV SOLN
INTRAVENOUS | Status: AC
  Filled 2020-07-18: qty 5

## 2020-07-18 MED ORDER — MIDAZOLAM HCL 5 MG/5ML IJ SOLN
INTRAMUSCULAR | Status: DC | PRN
  Administered 2020-07-18: 2 mg via INTRAVENOUS

## 2020-07-18 MED ORDER — MIDAZOLAM HCL 2 MG/2ML IJ SOLN
INTRAMUSCULAR | Status: AC
  Filled 2020-07-18: qty 2

## 2020-07-18 MED ORDER — ONDANSETRON HCL 4 MG/2ML IJ SOLN
INTRAMUSCULAR | Status: DC | PRN
  Administered 2020-07-18: 4 mg via INTRAVENOUS

## 2020-07-18 MED ORDER — LIDOCAINE HCL (CARDIAC) PF 100 MG/5ML IV SOSY
PREFILLED_SYRINGE | INTRAVENOUS | Status: DC | PRN
  Administered 2020-07-18: 60 mg via INTRAVENOUS

## 2020-07-18 MED ORDER — EPHEDRINE 5 MG/ML INJ
INTRAVENOUS | Status: AC
  Filled 2020-07-18: qty 10

## 2020-07-18 MED ORDER — CEFAZOLIN SODIUM-DEXTROSE 2-4 GM/100ML-% IV SOLN
INTRAVENOUS | Status: AC
  Filled 2020-07-18: qty 100

## 2020-07-18 MED ORDER — EPHEDRINE SULFATE 50 MG/ML IJ SOLN
INTRAMUSCULAR | Status: DC | PRN
  Administered 2020-07-18: 10 mg via INTRAVENOUS
  Administered 2020-07-18: 5 mg via INTRAVENOUS

## 2020-07-18 MED ORDER — LIDOCAINE HCL (PF) 1 % IJ SOLN
INTRAMUSCULAR | Status: AC
  Filled 2020-07-18: qty 60

## 2020-07-18 MED ORDER — FENTANYL CITRATE (PF) 100 MCG/2ML IJ SOLN
INTRAMUSCULAR | Status: AC
  Filled 2020-07-18: qty 2

## 2020-07-18 MED ORDER — FENTANYL CITRATE (PF) 100 MCG/2ML IJ SOLN
INTRAMUSCULAR | Status: DC | PRN
  Administered 2020-07-18: 100 ug via INTRAVENOUS
  Administered 2020-07-18: 50 ug via INTRAVENOUS

## 2020-07-18 MED ORDER — OXYCODONE HCL 5 MG/5ML PO SOLN: 5.0000 mg | Freq: Once | ORAL | Status: DC | PRN

## 2020-07-18 MED ORDER — CHLORHEXIDINE GLUCONATE CLOTH 2 % EX PADS: 6.0000 | MEDICATED_PAD | Freq: Once | CUTANEOUS | Status: DC

## 2020-07-18 MED ORDER — LIDOCAINE HCL (PF) 1 % IJ SOLN
INTRAMUSCULAR | Status: AC
  Filled 2020-07-18: qty 30

## 2020-07-18 MED ORDER — DEXAMETHASONE SODIUM PHOSPHATE 4 MG/ML IJ SOLN
INTRAMUSCULAR | Status: DC | PRN
  Administered 2020-07-18: 10 mg via INTRAVENOUS

## 2020-07-18 MED ORDER — DEXAMETHASONE SODIUM PHOSPHATE 10 MG/ML IJ SOLN
INTRAMUSCULAR | Status: AC
  Filled 2020-07-18: qty 1

## 2020-07-18 MED ORDER — EPINEPHRINE PF 1 MG/ML IJ SOLN
INTRAMUSCULAR | Status: AC
  Filled 2020-07-18: qty 1

## 2020-07-18 MED ORDER — LIDOCAINE 2% (20 MG/ML) 5 ML SYRINGE
INTRAMUSCULAR | Status: AC
  Filled 2020-07-18: qty 5

## 2020-07-18 MED ORDER — SUGAMMADEX SODIUM 200 MG/2ML IV SOLN
INTRAVENOUS | Status: DC | PRN
  Administered 2020-07-18: 200 mg via INTRAVENOUS

## 2020-07-18 MED ORDER — FENTANYL CITRATE (PF) 100 MCG/2ML IJ SOLN
25.0000 ug | INTRAMUSCULAR | Status: DC | PRN
  Administered 2020-07-18 (×2): 25 ug via INTRAVENOUS

## 2020-07-18 MED ORDER — LIDOCAINE-EPINEPHRINE 1 %-1:100000 IJ SOLN
INTRAMUSCULAR | Status: AC
  Filled 2020-07-18: qty 2

## 2020-07-18 MED ORDER — CEFAZOLIN SODIUM-DEXTROSE 2-4 GM/100ML-% IV SOLN
2.0000 g | INTRAVENOUS | Status: AC
  Administered 2020-07-18: 2 g via INTRAVENOUS

## 2020-07-18 MED ORDER — BACITRACIN ZINC 500 UNIT/GM EX OINT
TOPICAL_OINTMENT | CUTANEOUS | Status: AC
  Filled 2020-07-18: qty 28.35

## 2020-07-18 MED ORDER — PROPOFOL 500 MG/50ML IV EMUL
INTRAVENOUS | Status: DC | PRN
  Administered 2020-07-18: 50 ug/kg/min via INTRAVENOUS

## 2020-07-18 MED ORDER — PROPOFOL 10 MG/ML IV BOLUS
INTRAVENOUS | Status: AC
  Filled 2020-07-18: qty 20

## 2020-07-18 MED ORDER — PROPOFOL 500 MG/50ML IV EMUL
INTRAVENOUS | Status: AC
  Filled 2020-07-18: qty 50

## 2020-07-18 MED ORDER — LIDOCAINE-EPINEPHRINE 1 %-1:100000 IJ SOLN
INTRAMUSCULAR | Status: DC | PRN
  Administered 2020-07-18: 9 mL

## 2020-07-18 MED ORDER — PROPOFOL 10 MG/ML IV BOLUS
INTRAVENOUS | Status: DC | PRN
  Administered 2020-07-18: 180 mg via INTRAVENOUS

## 2020-07-18 MED ORDER — SCOPOLAMINE 1 MG/3DAYS TD PT72
MEDICATED_PATCH | TRANSDERMAL | Status: DC | PRN
  Administered 2020-07-18: 1 via TRANSDERMAL

## 2020-07-18 MED ORDER — ROCURONIUM BROMIDE 100 MG/10ML IV SOLN
INTRAVENOUS | Status: DC | PRN
  Administered 2020-07-18: 60 mg via INTRAVENOUS

## 2020-07-18 MED ORDER — ROCURONIUM BROMIDE 10 MG/ML (PF) SYRINGE
PREFILLED_SYRINGE | INTRAVENOUS | Status: AC
  Filled 2020-07-18: qty 10

## 2020-07-18 MED ORDER — SODIUM CHLORIDE 0.9 % IV SOLN
INTRAVENOUS | Status: DC | PRN
  Administered 2020-07-18: 2100 mL

## 2020-07-18 MED ORDER — OXYCODONE HCL 5 MG PO TABS: 5.0000 mg | ORAL_TABLET | Freq: Once | ORAL | Status: DC | PRN

## 2020-07-18 MED ORDER — ONDANSETRON HCL 4 MG/2ML IJ SOLN: 4.0000 mg | Freq: Once | INTRAMUSCULAR | Status: DC | PRN

## 2020-07-18 MED ORDER — ONDANSETRON HCL 4 MG/2ML IJ SOLN
INTRAMUSCULAR | Status: AC
  Filled 2020-07-18: qty 4

## 2020-07-18 SURGICAL SUPPLY — 61 items
BINDER ABDOMINAL  9 SM 30-45 (SOFTGOODS)
BINDER ABDOMINAL 10 UNV 27-48 (MISCELLANEOUS) IMPLANT
BINDER ABDOMINAL 12 SM 30-45 (SOFTGOODS) ×1 IMPLANT
BINDER ABDOMINAL 9 SM 30-45 (SOFTGOODS) IMPLANT
BINDER BREAST XLRG (GAUZE/BANDAGES/DRESSINGS) ×1 IMPLANT
BLADE HEX COATED 2.75 (ELECTRODE) ×2 IMPLANT
BLADE KNIFE PERSONA 10 (BLADE) ×1 IMPLANT
BLADE KNIFE PERSONA 15 (BLADE) ×3 IMPLANT
BNDG GAUZE ELAST 4 BULKY (GAUZE/BANDAGES/DRESSINGS) ×2 IMPLANT
CANISTER LIPO FAT HARVEST (MISCELLANEOUS) ×2 IMPLANT
CANNULA ASPIRATION 4X30 (CANNULA) ×1 IMPLANT
CAP BOUFFANT 24 BLUE NURSES (PROTECTIVE WEAR) ×2 IMPLANT
COVER BACK TABLE 60X90IN (DRAPES) ×2 IMPLANT
COVER MAYO STAND STRL (DRAPES) ×3 IMPLANT
COVER WAND RF STERILE (DRAPES) IMPLANT
DECANTER SPIKE VIAL GLASS SM (MISCELLANEOUS) ×2 IMPLANT
DRAPE U-SHAPE 76X120 STRL (DRAPES) ×4 IMPLANT
DRSG PAD ABDOMINAL 8X10 ST (GAUZE/BANDAGES/DRESSINGS) ×4 IMPLANT
ELECT REM PT RETURN 9FT ADLT (ELECTROSURGICAL) ×2
ELECTRODE REM PT RTRN 9FT ADLT (ELECTROSURGICAL) ×1 IMPLANT
GAUZE SPONGE 4X4 12PLY STRL (GAUZE/BANDAGES/DRESSINGS) ×1 IMPLANT
GLOVE BIOGEL PI IND STRL 6.5 (GLOVE) IMPLANT
GLOVE BIOGEL PI IND STRL 7.0 (GLOVE) IMPLANT
GLOVE BIOGEL PI INDICATOR 6.5 (GLOVE) ×4
GLOVE BIOGEL PI INDICATOR 7.0 (GLOVE) ×1
GLOVE SURG SS PI 7.0 STRL IVOR (GLOVE) ×2 IMPLANT
GOWN STRL REUS W/ TWL LRG LVL3 (GOWN DISPOSABLE) ×2 IMPLANT
GOWN STRL REUS W/TWL LRG LVL3 (GOWN DISPOSABLE) ×8
LINER CANISTER 1000CC FLEX (MISCELLANEOUS) ×2 IMPLANT
MARKER SKIN DUAL TIP RULER LAB (MISCELLANEOUS) ×1 IMPLANT
NDL HYPO 25X1 1.5 SAFETY (NEEDLE) ×1 IMPLANT
NDL SAFETY ECLIPSE 18X1.5 (NEEDLE) IMPLANT
NDL SPNL 18GX3.5 QUINCKE PK (NEEDLE) IMPLANT
NEEDLE HYPO 18GX1.5 SHARP (NEEDLE) ×4
NEEDLE HYPO 25X1 1.5 SAFETY (NEEDLE) ×2 IMPLANT
NEEDLE SPNL 18GX3.5 QUINCKE PK (NEEDLE) IMPLANT
NS IRRIG 1000ML POUR BTL (IV SOLUTION) ×3 IMPLANT
PACK BASIN DAY SURGERY FS (CUSTOM PROCEDURE TRAY) ×2 IMPLANT
PENCIL SMOKE EVACUATOR (MISCELLANEOUS) ×2 IMPLANT
SCRUB TECHNI CARE 4 OZ NO DYE (MISCELLANEOUS) ×3 IMPLANT
SHEET MEDIUM DRAPE 40X70 STRL (DRAPES) ×4 IMPLANT
SLEEVE SCD COMPRESS KNEE MED (MISCELLANEOUS) ×2 IMPLANT
SPONGE LAP 18X18 RF (DISPOSABLE) ×2 IMPLANT
STAPLER VISISTAT 35W (STAPLE) ×2 IMPLANT
STRIP CLOSURE SKIN 1/2X4 (GAUZE/BANDAGES/DRESSINGS) IMPLANT
SUT ETHILON 3 0 PS 1 (SUTURE) ×1 IMPLANT
SUT MERSILENE 2.0 SH NDLE (SUTURE) IMPLANT
SUT MNCRL AB 3-0 PS2 18 (SUTURE) ×2 IMPLANT
SUT MNCRL AB 4-0 PS2 18 (SUTURE) ×4 IMPLANT
SUT MON AB 5-0 PS2 18 (SUTURE) IMPLANT
SUT PROLENE 3 0 PS 1 (SUTURE) IMPLANT
SYR 20ML LL LF (SYRINGE) ×1 IMPLANT
SYR 50ML LL SCALE MARK (SYRINGE) ×2 IMPLANT
SYR BULB IRRIG 60ML STRL (SYRINGE) IMPLANT
SYR CONTROL 10ML LL (SYRINGE) ×2 IMPLANT
SYR TOOMEY 50ML (SYRINGE) ×4 IMPLANT
TRAY DSU PREP LF (CUSTOM PROCEDURE TRAY) ×2 IMPLANT
TRAY FOLEY W/BAG SLVR 14FR LF (SET/KITS/TRAYS/PACK) IMPLANT
TUBING INFILTRATION IT-10001 (TUBING) ×1 IMPLANT
TUBING SET GRADUATE ASPIR 12FT (MISCELLANEOUS) ×2 IMPLANT
UNDERPAD 30X36 HEAVY ABSORB (UNDERPADS AND DIAPERS) ×5 IMPLANT

## 2020-07-18 NOTE — Discharge Instructions (Signed)
1. No lifting greater than 5 lbs with arms for 4 weeks. 2. Percocet 5/325 mg tabs 1-2 tabs po q 4-6 hours prn pain- prescription given in office. 3. Keflex 500 mg tab po 4 x dailty- prescription given in office. 4. Sterapred dose pack as directed- prescription given in office. 5. Wear Abdominal binder and post-op surgical bra. 6. Follow-up appointment Friday in office.    Post Anesthesia Home Care Instructions  Activity: Get plenty of rest for the remainder of the day. A responsible individual must stay with you for 24 hours following the procedure.  For the next 24 hours, DO NOT: -Drive a car -Paediatric nurse -Drink alcoholic beverages -Take any medication unless instructed by your physician -Make any legal decisions or sign important papers.  Meals: Start with liquid foods such as gelatin or soup. Progress to regular foods as tolerated. Avoid greasy, spicy, heavy foods. If nausea and/or vomiting occur, drink only clear liquids until the nausea and/or vomiting subsides. Call your physician if vomiting continues.  Special Instructions/Symptoms: Your throat may feel dry or sore from the anesthesia or the breathing tube placed in your throat during surgery. If this causes discomfort, gargle with warm salt water. The discomfort should disappear within 24 hours.  If you had a scopolamine patch placed behind your ear for the management of post- operative nausea and/or vomiting:  1. The medication in the patch is effective for 72 hours, after which it should be removed.  Wrap patch in a tissue and discard in the trash. Wash hands thoroughly with soap and water. 2. You may remove the patch earlier than 72 hours if you experience unpleasant side effects which may include dry mouth, dizziness or visual disturbances. 3. Avoid touching the patch. Wash your hands with soap and water after contact with the patch.

## 2020-07-18 NOTE — Brief Op Note (Signed)
07/18/2020  11:11 AM  PATIENT:  [name]  59 y.o. female  PRE-OPERATIVE DIAGNOSIS: 1) RIGHT BREAST CANCER 2) BREAST ASSYMETRY  POST-OPERATIVE DIAGNOSIS: 1) RIGHT BREAST CANCER 2) BREAST ASSYMETRY  PROCEDURE: POWER-ASSISTED LIPOSUCTION OF ABDOMEN AND FLANKS WITH MICROFAT GRAFTING TO LEFT BREAST FOR SYMMETRY PROCEDURE  SURGEON:  Surgeon(s) and Role:    * Isabele Lollar, Audrea Muscat, MD - Primary  ANESTHESIA:   general  EBL:  75 cc  BLOOD ADMINISTERED:none  DRAINS: none   LOCAL MEDICATIONS USED:  1% LIDOCAINE WITH EPINEPHRINE  SPECIMEN:  No Specimen  DISPOSITION OF SPECIMEN:  N/A  COUNTS:  YES  DICTATION: .Other Dictation: Dictation Number [id number]  PLAN OF CARE: Discharge to home after PACU  PATIENT DISPOSITION:  PACU - hemodynamically stable.   Delay start of Pharmacological VTE agent (>24hrs) due to surgical blood loss or risk of bleeding: not applicable

## 2020-07-18 NOTE — Anesthesia Procedure Notes (Signed)
Procedure Name: Intubation Date/Time: 07/18/2020 8:02 AM Performed by: Signe Colt, CRNA Pre-anesthesia Checklist: Patient identified, Emergency Drugs available, Suction available and Patient being monitored Patient Re-evaluated:Patient Re-evaluated prior to induction Oxygen Delivery Method: Circle system utilized Preoxygenation: Pre-oxygenation with 100% oxygen Induction Type: IV induction Ventilation: Mask ventilation without difficulty Laryngoscope Size: Mac and 3 Grade View: Grade I Tube type: Oral Tube size: 7.0 mm Number of attempts: 1 Airway Equipment and Method: Stylet and Oral airway Placement Confirmation: ETT inserted through vocal cords under direct vision,  positive ETCO2 and breath sounds checked- equal and bilateral Secured at: 21 cm Tube secured with: Tape Dental Injury: Teeth and Oropharynx as per pre-operative assessment

## 2020-07-18 NOTE — Anesthesia Preprocedure Evaluation (Signed)

## 2020-07-18 NOTE — H&P (Signed)
  H&P faxed to surgical center.  -History and Physical Reviewed  -Patient has been re-examined  -No change in the plan of care  [name]    

## 2020-07-18 NOTE — Anesthesia Postprocedure Evaluation (Signed)
Anesthesia Post Note  Patient: [name]  Procedure(s) Performed: MICRO FAT GRAFTING USING LIPOSUCTION FROM ABDOMEN AND BILATERAL FLANKS (Left Breast)     Patient location during evaluation: PACU Anesthesia Type: General Level of consciousness: awake and alert Pain management: pain level controlled Vital Signs Assessment: post-procedure vital signs reviewed and stable Respiratory status: spontaneous breathing, nonlabored ventilation, respiratory function stable and patient connected to nasal cannula oxygen Cardiovascular status: blood pressure returned to baseline and stable Postop Assessment: no apparent nausea or vomiting Anesthetic complications: no   No complications documented.  Last Vitals:  Vitals:   07/18/20 1145 07/18/20 1200  BP: (!) 162/90 (!) 154/92  Pulse: 82 78  Resp: 12 15  Temp:    SpO2: 100% 98%    Last Pain:  Vitals:   07/18/20 1200  TempSrc:   PainSc: Asleep                 [name]

## 2020-07-18 NOTE — Transfer of Care (Signed)
Immediate Anesthesia Transfer of Care Note  Patient: [name]  Procedure(s) Performed: MICRO FAT GRAFTING USING LIPOSUCTION FROM ABDOMEN AND BILATERAL FLANKS (Left Breast)  Patient Location: PACU  Anesthesia Type:General  Level of Consciousness: drowsy and patient cooperative  Airway & Oxygen Therapy: Patient Spontanous Breathing and Patient connected to face mask oxygen  Post-op Assessment: Report given to RN and Post -op Vital signs reviewed and stable  Post vital signs: Reviewed and stable  Last Vitals:  Vitals Value Taken Time  BP 142/85 07/18/20 1121  Temp    Pulse 69 07/18/20 1121  Resp 15 07/18/20 1121  SpO2 100 % 07/18/20 1121  Vitals shown include unvalidated device data.  Last Pain:  Vitals:   07/18/20 0643  TempSrc: Oral  PainSc: 0-No pain         Complications: No complications documented.

## 2020-07-19 ENCOUNTER — Encounter (HOSPITAL_BASED_OUTPATIENT_CLINIC_OR_DEPARTMENT_OTHER): Payer: Self-pay | Admitting: Plastic Surgery

## 2020-08-01 DIAGNOSIS — Z86018 Personal history of other benign neoplasm: Secondary | ICD-10-CM | POA: Diagnosis not present

## 2020-08-01 DIAGNOSIS — L814 Other melanin hyperpigmentation: Secondary | ICD-10-CM | POA: Diagnosis not present

## 2020-08-01 DIAGNOSIS — Z808 Family history of malignant neoplasm of other organs or systems: Secondary | ICD-10-CM | POA: Diagnosis not present

## 2020-08-01 DIAGNOSIS — L821 Other seborrheic keratosis: Secondary | ICD-10-CM | POA: Diagnosis not present

## 2020-08-15 DIAGNOSIS — R7303 Prediabetes: Secondary | ICD-10-CM | POA: Diagnosis not present

## 2020-08-15 DIAGNOSIS — E782 Mixed hyperlipidemia: Secondary | ICD-10-CM | POA: Diagnosis not present

## 2020-08-15 DIAGNOSIS — R946 Abnormal results of thyroid function studies: Secondary | ICD-10-CM | POA: Diagnosis not present

## 2020-08-27 NOTE — Op Note (Signed)
NAME: [name], [name] MEDICAL RECORD [id number] ACCOUNT [id number] DATE OF BIRTH:11/05/1960 FACILITY: [facility] LOCATION: [facility] PHYSICIAN:Helayne Metsker Jennye Boroughs, MD  OPERATIVE REPORT  DATE OF PROCEDURE:  07/18/2020  PREOPERATIVE DIAGNOSES: 1.  History of stage II right breast cancer. 2.  History of breast asymmetry. 3.  History of left breast ptosis.  POSTOPERATIVE DIAGNOSES: 1.  History of stage II right breast cancer. 2.  History of breast asymmetry. 3.  History of left breast ptosis.  PROCEDURE PERFORMED: 1.  Left breast symmetry procedure using micro fat grafting to the upper pole of the breast. 2.  Power-assisted liposuction of the abdomen for harvesting of the fat.  ATTENDING SURGEON:  Hetty Blend, MD  ANESTHESIA:  General.  COMPLICATIONS:  None.  INDICATIONS FOR THE PROCEDURE:  The patient is a 59 year old Caucasian female who was diagnosed with stage II right breast cancer in 1999.  She underwent a right lumpectomy with radiation and chemotherapy.  As a result, she did have a defect in the right  breast from the radiation treatments.  The volume loss and defect were reconstructed using micro fat grafting in previous surgeries.  She also had left breast ptosis and breast asymmetry and desired to have better symmetry between her breasts.  The  patient underwent a left Wise mastopexy and has had some fat grafting to that breast.  However, the upper half/upper pole of the left breast is not as full as the right breast, and she would like to see better symmetry in these contours.  We will  therefore proceed to the OR for power-assisted liposuction to harvest abdominal fat and then prepare for micro fat grafting to the left upper breast.  DESCRIPTION OF PROCEDURE:  The patient was marked in the preop holding area for the future surgery.  She was then taken back into the OR and prepped while awake over her back all the way down to the buttocks and her arms and lateral  chest.  She was then  laid down on the sterile sheets and general anesthesia was induced.  The prep was then finished with Hibiclens and the patient's chest and abdomen were prepped with Hibiclens and draped in sterile fashion.  Tumescent fluid was then infiltrated into the  subcutaneous tissues of the abdomen in preparation for the power-assisted liposuction.  After allowing it to work for about 25 minutes, I proceeded with the power-assisted liposuction in the SAFE technique.  The fat was harvested, then it was processed  and we used it for reinjection into the upper half/upper pole of the left breast contour.  Using micro fat grafting techniques at multiple levels in the subcutaneous tissues, the fat was injected.  When I had injected enough fat to the point where it  looked like it matched the right side, I then put about 20% more fat in as we noted that some of the micro fat grafting will not survive the transplantation and micro grafting process.  I proceeded to close the small incisions that had been used for the  fat grafting using a 3-0 Monocryl suture.  The incisions for the liposuction were also closed using a 3-0 Monocryl in the dermal layer followed by 3-0 Monocryl running intracuticular stitch on the skin.  After the fat grafting had been completed, it  appeared to give a nice contour to the patient's left breast and matched it much better to the right breast.  The incisions were dressed with Steri-Strips.  There were no complications.  The patient tolerated the  procedure well.  The final needle and  sponge counts were reported to be correct at the end of the case.  The amount that was injected is as noted on the accompanying nursing notes and were several hundred mLs of fat.  The patient was then placed into the light postoperative support bra as well as  an abdominal binder for the areas of liposuction.  The patient was then awakened and taken to the recovery room in stable condition.  She  was also recovered without complications.  The patient's family was then instructed in proper postoperative instructions.  She was then discharged home in their  care in stable condition.  Followup appointment will be within a few days in the office.  HN/NUANCE  D:08/27/2020 T:08/27/2020 JOB:[id number]

## 2020-08-30 DIAGNOSIS — E010 Iodine-deficiency related diffuse (endemic) goiter: Secondary | ICD-10-CM | POA: Diagnosis not present

## 2020-08-30 DIAGNOSIS — E042 Nontoxic multinodular goiter: Secondary | ICD-10-CM | POA: Diagnosis not present

## 2020-11-02 DIAGNOSIS — M545 Low back pain, unspecified: Secondary | ICD-10-CM | POA: Diagnosis not present

## 2020-11-02 DIAGNOSIS — M5430 Sciatica, unspecified side: Secondary | ICD-10-CM | POA: Diagnosis not present

## 2020-11-10 DIAGNOSIS — M6281 Muscle weakness (generalized): Secondary | ICD-10-CM | POA: Diagnosis not present

## 2021-02-23 DIAGNOSIS — E7800 Pure hypercholesterolemia, unspecified: Secondary | ICD-10-CM | POA: Diagnosis not present

## 2021-03-02 ENCOUNTER — Telehealth: Payer: Self-pay | Admitting: Genetic Counselor

## 2021-03-02 NOTE — Telephone Encounter (Signed)
[name] cld to schedule a genetic counseling appt. She has a personal hx of brca. An appt has been scheduled for [name] to see Santiago Glad on 6/15 at 9am. Pt aware to arrive 15 minutes early.

## 2021-03-14 ENCOUNTER — Encounter: Payer: Self-pay | Admitting: Genetic Counselor

## 2021-03-14 ENCOUNTER — Inpatient Hospital Stay: Payer: No Typology Code available for payment source | Attending: Genetic Counselor | Admitting: Genetic Counselor

## 2021-03-14 ENCOUNTER — Other Ambulatory Visit: Payer: Self-pay

## 2021-03-14 ENCOUNTER — Inpatient Hospital Stay: Payer: No Typology Code available for payment source

## 2021-03-14 DIAGNOSIS — Z801 Family history of malignant neoplasm of trachea, bronchus and lung: Secondary | ICD-10-CM

## 2021-03-14 DIAGNOSIS — Z853 Personal history of malignant neoplasm of breast: Secondary | ICD-10-CM | POA: Diagnosis not present

## 2021-03-14 DIAGNOSIS — Z800 Family history of malignant neoplasm of digestive organs: Secondary | ICD-10-CM | POA: Insufficient documentation

## 2021-03-14 DIAGNOSIS — Z808 Family history of malignant neoplasm of other organs or systems: Secondary | ICD-10-CM | POA: Insufficient documentation

## 2021-03-14 DIAGNOSIS — Z809 Family history of malignant neoplasm, unspecified: Secondary | ICD-10-CM

## 2021-03-14 HISTORY — DX: Personal history of malignant neoplasm of breast: Z85.3

## 2021-03-14 LAB — GENETIC SCREENING ORDER

## 2021-03-14 NOTE — Progress Notes (Signed)
REFERRING PROVIDER: [name], MD [facility] [employer] [place],  [place] [address]  PRIMARY PROVIDER:  [name], MD  PRIMARY REASON FOR VISIT:  1. Family history of melanoma   2. Personal history of malignant neoplasm of breast   3. Family history of colon cancer      HISTORY OF PRESENT ILLNESS:   [name], a 60 y.o. female, was seen for a [facility] cancer genetics consultation at the request of Dr. Laurann Montana due to a personal and family history of cancer.  [name] presents to clinic today to discuss the possibility of a hereditary predisposition to cancer, genetic testing, and to further clarify her future cancer risks, as well as potential cancer risks for family members.   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 The treatment plan included lumpectomy, chemotherapy and radiation. Recently she had additional surgery.    CANCER HISTORY:  Oncology History   No history exists.     RISK FACTORS:  Menarche was at age 45-14.  First live birth at age 69.  OCP use for approximately  <5  years.  Ovaries intact: yes.  Hysterectomy: no.  Menopausal status: postmenopausal.  HRT use: 0 years. Colonoscopy: yes; normal. Mammogram within the last year: yes. Number of breast biopsies: 1. Up to date with pelvic exams: yes. Any excessive radiation exposure in the past: no  Past Medical History:  Diagnosis Date   Breast asymmetry 08/2018   Cancer [facility]) 2000   breast right, lumpectomy, chemo and radiation   Family history of colon cancer    Family history of melanoma    GERD (gastroesophageal reflux disease)    no meds   Headache    Personal history of malignant neoplasm of breast 03/14/2021   PONV (postoperative nausea and vomiting)     Past Surgical History:  Procedure Laterality Date   BREAST RECONSTRUCTION Right 07/23/2018   Procedure: RIGHT  BREAST RECONSTRUCTION;  Surgeon: Youlanda Roys, MD;   Location: [place];  Service: Plastics;  Laterality: Right;   BREAST RECONSTRUCTION Bilateral 01/31/2020   Procedure: BREAST RECONSTRUCTION;  Surgeon: Youlanda Roys, MD;  Location: [place];  Service: Plastics;  Laterality: Bilateral;   BREAST SURGERY Right 2000   lumpectomy, sentinel node removed   CESAREAN SECTION     x 1   COLONOSCOPY WITH PROPOFOL N/A 05/21/2016   Procedure: COLONOSCOPY WITH PROPOFOL;  Surgeon: Garlan Fair, MD;  Location: [facility];  Service: Endoscopy;  Laterality: N/A;   LIPOSUCTION WITH LIPOFILLING Bilateral 07/23/2018   Procedure: LIPOSUCTION AND FAT GRAFTING;  Surgeon: Contogiannis, Audrea Muscat, MD;  Location: [place];  Service: Plastics;  Laterality: Bilateral;  TO RIGHT BREAST   LIPOSUCTION WITH LIPOFILLING Right 09/28/2018   Procedure: ABDOMEN, HIPS AND FLANKS LIPOSUCTION WITH MICRO FAT GRAFTING TO RIGHT BREAST;  Surgeon: Youlanda Roys, MD;  Location: [place];  Service: Plastics;  Laterality: Right;   LIPOSUCTION WITH LIPOFILLING Right 03/02/2019   Procedure: LIPOSUCTION OF ABDOMEN HIPS AND FLANKS WITH FAT GRAFTING TO RIGHT BREAT;  Surgeon: Contogiannis, Audrea Muscat, MD;  Location: [place];  Service: Plastics;  Laterality: Right;   LIPOSUCTION WITH LIPOFILLING Bilateral 01/31/2020   Procedure: LIPOSUCTION WITH LIPOFILLING;  Surgeon: Contogiannis, Audrea Muscat, MD;  Location: [place];  Service: Plastics;  Laterality: Bilateral;   LIPOSUCTION WITH LIPOFILLING Left 07/18/2020   Procedure:  MICRO FAT GRAFTING USING LIPOSUCTION FROM ABDOMEN AND BILATERAL FLANKS;  Surgeon: Contogiannis, Audrea Muscat, MD;  Location: [place];  Service: Plastics;  Laterality: Left;   MASTOPEXY Left 07/13/2019   Procedure: LEFT BREAST MASTOPEXY;  Surgeon: Nathanial Rancher Audrea Muscat, MD;  Location: [place];  Service: Plastics;  Laterality: Left;   TUBAL  LIGATION      Social History   Socioeconomic History   Marital status: Legally Separated    Spouse name: Not on file   Number of children: Not on file   Years of education: Not on file   Highest education level: Not on file  Occupational History   Not on file  Tobacco Use   Smoking status: Former    Packs/day: 1.00    Years: 10.00    Pack years: 10.00    Types: Cigarettes    Quit date: 09/30/1986    Years since quitting: 34.4   Smokeless tobacco: Never  Vaping Use   Vaping Use: Never used  Substance and Sexual Activity   Alcohol use: Yes    Comment: occ   Drug use: Never   Sexual activity: Not Currently    Birth control/protection: Post-menopausal  Other Topics Concern   Not on file  Social History Narrative   Not on file   Social Determinants of Health   Financial Resource Strain: Not on file  Food Insecurity: Not on file  Transportation Needs: Not on file  Physical Activity: Not on file  Stress: Not on file  Social Connections: Not on file     FAMILY HISTORY:  We obtained a detailed, 4-generation family history.  Significant diagnoses are listed below: Family History  Problem Relation Age of Onset   Colon cancer Mother        dx in her 59s   Alcohol abuse Father    Lung cancer Maternal Aunt    Cancer Maternal Uncle        d. 53s   Cancer Paternal Aunt        NOS   Colon cancer Maternal Grandmother        dx in her 59s   Cancer Maternal Grandfather        NOS   Alcohol abuse Paternal Grandfather    Melanoma Half-Sister        dx in her 19s     The patient has two daughters who are cancer free.  She has maternal half siblings - three sisters and a brother.  One sister had melanoma in her 34's.  Both parents are deceased.  The patient's father died from alcohol related issues.  He had two brothers and a sister.  The sister had cancer NOS.  Both paternal grandparents died of non-cancer related illnesses.  The patient's mother had colon cancer in her  17's.  She had two brothers and a sister.  One brother died in a MVA, one brother had cancer NOS in his 34's and the sister had lung cancer.  The maternal grandparents are deceased.  The grandmother had colon cancer in her 22's and the grandfather had cancer NOS.  [name] is unaware of previous family history of genetic testing for hereditary cancer risks. Patient's maternal ancestors are of [place] descent, and paternal ancestors are of Caucasian descent. There is no reported Ashkenazi Jewish ancestry. There is no known consanguinity.  GENETIC COUNSELING ASSESSMENT: [name] is a 60 y.o. female with a personal and family history of cancer which is somewhat suggestive  of a hereditary cancer syndrome and predisposition to cancer given the ages of onset. We, therefore, discussed and recommended the following at today's visit.   DISCUSSION: We discussed that 5 - 10% of breast cancer is hereditary, with most cases associated with BRCA mutations.  There are other genes that can be associated with hereditary breast cancer syndromes.  These include ATM, CHEK2 and PALB2.  We discussed that testing is beneficial for several reasons including knowing how to follow individuals after completing their treatment, identifying whether potential treatment options such as PARP inhibitors would be beneficial, and understand if other family members could be at risk for cancer and allow them to undergo genetic testing.   We reviewed the characteristics, features and inheritance patterns of hereditary cancer syndromes. We also discussed genetic testing, including the appropriate family members to test, the process of testing, insurance coverage and turn-around-time for results. We discussed the implications of a negative, positive, carrier and/or variant of uncertain significant result. We recommended [name] pursue genetic testing for the Multi cancer gene panel. The Multi-Gene Panel offered by [facility] includes sequencing  and/or deletion duplication testing of the following 84 genes: AIP, ALK, APC, ATM, AXIN2,BAP1,  BARD1, BLM, BMPR1A, BRCA1, BRCA2, BRIP1, CASR, CDC73, CDH1, CDK4, CDKN1B, CDKN1C, CDKN2A (p14ARF), CDKN2A (p16INK4a), CEBPA, CHEK2, CTNNA1, DICER1, DIS3L2, EGFR (c.2369C>T, p.Thr790Met variant only), EPCAM (Deletion/duplication testing only), FH, FLCN, GATA2, GPC3, GREM1 (Promoter region deletion/duplication testing only), HOXB13 (c.251G>A, p.Gly84Glu), HRAS, KIT, MAX, MEN1, MET, MITF (c.952G>A, p.Glu318Lys variant only), MLH1, MSH2, MSH3, MSH6, MUTYH, NBN, NF1, NF2, NTHL1, PALB2, PDGFRA, PHOX2B, PMS2, POLD1, POLE, POT1, PRKAR1A, PTCH1, PTEN, RAD50, RAD51C, RAD51D, RB1, RECQL4, RET, RUNX1, SDHAF2, SDHA (sequence changes only), SDHB, SDHC, SDHD, SMAD4, SMARCA4, SMARCB1, SMARCE1, STK11, SUFU, TERC, TERT, TMEM127, TP53, TSC1, TSC2, VHL, WRN and WT1.   Based on [name]'s personal and family history of cancer, she meets medical criteria for genetic testing. Despite that she meets criteria, she may still have an out of pocket cost. We discussed that if her out of pocket cost for testing is over $100, the laboratory will call and confirm whether she wants to proceed with testing.  If the out of pocket cost of testing is less than $100 she will be billed by the genetic testing laboratory.   PLAN: After considering the risks, benefits, and limitations, [name] provided informed consent to pursue genetic testing and the blood sample was sent to [facility] for analysis of the Multi-cancer panel+RNA. Results should be available within approximately 2-3 weeks' time, at which point they will be disclosed by telephone to [name], as will any additional recommendations warranted by these results. [name] will receive a summary of her genetic counseling visit and a copy of her results once available. This information will also be available in Epic.   Lastly, we encouraged [name] to remain in contact with cancer  genetics annually so that we can continuously update the family history and inform her of any changes in cancer genetics and testing that may be of benefit for this family.   [name] questions were answered to her satisfaction today. Our contact information was provided should additional questions or concerns arise. Thank you for the referral and allowing [place] to share in the care of your patient.   Torrin Crihfield P. Florene Glen, [place], [facility] Licensed, Insurance risk surveyor [email or web address] phone: [phone]  The patient was seen for a total of 40 minutes in face-to-face genetic counseling.  The patient was seen alone.  This  patient was discussed with Drs. Magrinat, Lindi Adie and/or Burr Medico who agrees with the above.    _______________________________________________________________________ For Office Staff:  Number of people involved in session: 1 Was an Intern/ student involved with case: no

## 2021-04-03 ENCOUNTER — Telehealth: Payer: Self-pay | Admitting: Genetic Counselor

## 2021-04-03 ENCOUNTER — Encounter: Payer: Self-pay | Admitting: Genetic Counselor

## 2021-04-03 DIAGNOSIS — Z1379 Encounter for other screening for genetic and chromosomal anomalies: Secondary | ICD-10-CM | POA: Insufficient documentation

## 2021-04-03 NOTE — Telephone Encounter (Signed)
Revealed negative genetic testing.  Discussed that we do not know why she has had breast cancer or why there is cancer in the family. It could be due to a different gene that we are not testing, or maybe our current technology may not be able to pick something up.  It will be important for her to keep in contact with genetics to keep up with whether additional testing may be needed.    

## 2021-04-05 ENCOUNTER — Ambulatory Visit: Payer: Self-pay | Admitting: Genetic Counselor

## 2021-04-05 DIAGNOSIS — Z1379 Encounter for other screening for genetic and chromosomal anomalies: Secondary | ICD-10-CM

## 2021-04-05 NOTE — Progress Notes (Signed)
HPI:  Ms. Cochrane was previously seen in the [place] clinic due to a personal and family history of cancer and concerns regarding a hereditary predisposition to cancer. Please refer to our prior cancer genetics clinic note for more information regarding our discussion, assessment and recommendations, at the time. Ms. Quade recent genetic test results were disclosed to her, as were recommendations warranted by these results. These results and recommendations are discussed in more detail below.  CANCER HISTORY:  Oncology History   No history exists.    FAMILY HISTORY:  We obtained a detailed, 4-generation family history.  Significant diagnoses are listed below: Family History  Problem Relation Age of Onset   Colon cancer Mother        dx in her 93s   Alcohol abuse Father    Lung cancer Maternal Aunt    Cancer Maternal Uncle        d. 73s   Cancer Paternal Aunt        NOS   Colon cancer Maternal Grandmother        dx in her 39s   Cancer Maternal Grandfather        NOS   Alcohol abuse Paternal Grandfather    Melanoma Half-Sister        dx in her 20s    The patient has two daughters who are cancer free.  She has maternal half siblings - three sisters and a brother.  One sister had melanoma in her 51's.  Both parents are deceased.   The patient's father died from alcohol related issues.  He had two brothers and a sister.  The sister had cancer NOS.  Both paternal grandparents died of non-cancer related illnesses.   The patient's mother had colon cancer in her 72's.  She had two brothers and a sister.  One brother died in a MVA, one brother had cancer NOS in his 100's and the sister had lung cancer.  The maternal grandparents are deceased.  The grandmother had colon cancer in her 88's and the grandfather had cancer NOS.   Ms. Hilgeman is unaware of previous family history of genetic testing for hereditary cancer risks. Patient's maternal ancestors are of [place] descent, and  paternal ancestors are of Caucasian descent. There is no reported Ashkenazi Jewish ancestry. There is no known consanguinity.  GENETIC TEST RESULTS: Genetic testing reported out on April 02, 2021 through the Multi- cancer panel found no pathogenic mutations. The Multi-Gene Panel offered by [facility] includes sequencing and/or deletion duplication testing of the following 84 genes: AIP, ALK, APC, ATM, AXIN2,BAP1,  BARD1, BLM, BMPR1A, BRCA1, BRCA2, BRIP1, CASR, CDC73, CDH1, CDK4, CDKN1B, CDKN1C, CDKN2A (p14ARF), CDKN2A (p16INK4a), CEBPA, CHEK2, CTNNA1, DICER1, DIS3L2, EGFR (c.2369C>T, p.Thr790Met variant only), EPCAM (Deletion/duplication testing only), FH, FLCN, GATA2, GPC3, GREM1 (Promoter region deletion/duplication testing only), HOXB13 (c.251G>A, p.Gly84Glu), HRAS, KIT, MAX, MEN1, MET, MITF (c.952G>A, p.Glu318Lys variant only), MLH1, MSH2, MSH3, MSH6, MUTYH, NBN, NF1, NF2, NTHL1, PALB2, PDGFRA, PHOX2B, PMS2, POLD1, POLE, POT1, PRKAR1A, PTCH1, PTEN, RAD50, RAD51C, RAD51D, RB1, RECQL4, RET, RUNX1, SDHAF2, SDHA (sequence changes only), SDHB, SDHC, SDHD, SMAD4, SMARCA4, SMARCB1, SMARCE1, STK11, SUFU, TERC, TERT, TMEM127, TP53, TSC1, TSC2, VHL, WRN and WT1. The test report has been scanned into EPIC and is located under the Molecular Pathology section of the Results Review tab.  A portion of the result report is included below for reference.     We discussed with Ms. Ruffini that because current genetic testing is not perfect, it is possible  there may be a gene mutation in one of these genes that current testing cannot detect, but that chance is small.  We also discussed, that there could be another gene that has not yet been discovered, or that we have not yet tested, that is responsible for the cancer diagnoses in the family. It is also possible there is a hereditary cause for the cancer in the family that Ms. Wrenn did not inherit and therefore was not identified in her testing.  Therefore, it is important to  remain in touch with cancer genetics in the future so that we can continue to offer Ms. Haynes the most up to date genetic testing.   ADDITIONAL GENETIC TESTING: We discussed with Ms. Hagger that her genetic testing was fairly extensive.  If there are genes identified to increase cancer risk that can be analyzed in the future, we would be happy to discuss and coordinate this testing at that time.    CANCER SCREENING RECOMMENDATIONS: Ms. Nader test result is considered negative (normal).  This means that we have not identified a hereditary cause for her personal and family history of cancer at this time. Most cancers happen by chance and this negative test suggests that her cancer may fall into this category.    While reassuring, this does not definitively rule out a hereditary predisposition to cancer. It is still possible that there could be genetic mutations that are undetectable by current technology. There could be genetic mutations in genes that have not been tested or identified to increase cancer risk.  Therefore, it is recommended she continue to follow the cancer management and screening guidelines provided by her oncology and primary healthcare provider.   An individual's cancer risk and medical management are not determined by genetic test results alone. Overall cancer risk assessment incorporates additional factors, including personal medical history, family history, and any available genetic information that may result in a personalized plan for cancer prevention and surveillance  RECOMMENDATIONS FOR FAMILY MEMBERS:  Individuals in this family might be at some increased risk of developing cancer, over the general population risk, simply due to the family history of cancer.  We recommended women in this family have a yearly mammogram beginning at age 69, or 24 years younger than the earliest onset of cancer, an annual clinical breast exam, and perform monthly breast self-exams. Women in this  family should also have a gynecological exam as recommended by their primary provider. All family members should be referred for colonoscopy starting at age 23.  FOLLOW-UP: Lastly, we discussed with Ms. Garman that cancer genetics is a rapidly advancing field and it is possible that new genetic tests will be appropriate for her and/or her family members in the future. We encouraged her to remain in contact with cancer genetics on an annual basis so we can update her personal and family histories and let her know of advances in cancer genetics that may benefit this family.   Our contact number was provided. Ms. Salts questions were answered to her satisfaction, and she knows she is welcome to call us at anytime with additional questions or concerns.   Roma Kayser, [place], [facility] Licensed, Certified Genetic Counselor [email or web address]

## 2021-06-19 ENCOUNTER — Encounter (HOSPITAL_BASED_OUTPATIENT_CLINIC_OR_DEPARTMENT_OTHER): Payer: Self-pay | Admitting: Plastic Surgery

## 2021-06-19 ENCOUNTER — Other Ambulatory Visit: Payer: Self-pay

## 2021-06-21 ENCOUNTER — Other Ambulatory Visit: Payer: Self-pay | Admitting: Plastic Surgery

## 2021-06-22 ENCOUNTER — Other Ambulatory Visit: Payer: Self-pay | Admitting: Plastic Surgery

## 2021-06-22 ENCOUNTER — Ambulatory Visit: Payer: Self-pay | Admitting: Plastic Surgery

## 2021-06-22 MED ORDER — TRANEXAMIC ACID 1000 MG/10ML IV SOLN: 1000.0000 mg | INTRAVENOUS | Status: AC

## 2021-06-22 MED ORDER — TRANEXAMIC ACID 1000 MG/10ML IV SOLN: 1000.0000 mg | INTRAVENOUS | Status: DC

## 2021-06-22 NOTE — Progress Notes (Unsigned)
This encounter was created in error - please disregard.

## 2021-06-25 ENCOUNTER — Other Ambulatory Visit: Payer: Self-pay | Admitting: *Deleted

## 2021-06-25 ENCOUNTER — Ambulatory Visit: Payer: Self-pay | Admitting: Plastic Surgery

## 2021-06-25 NOTE — Anesthesia Preprocedure Evaluation (Addendum)
Anesthesia Evaluation  Patient identified by MRN, date of birth, ID band Patient awake    Reviewed: Allergy & Precautions, Patient's Chart, lab work & pertinent test results  History of Anesthesia Complications (+) PONV  Airway Mallampati: II  TM Distance: >3 FB Neck ROM: Full    Dental  (+) Teeth Intact   Pulmonary neg pulmonary ROS, former smoker,    Pulmonary exam normal        Cardiovascular negative cardio ROS   Rhythm:Regular Rate:Normal     Neuro/Psych negative neurological ROS  negative psych ROS   GI/Hepatic Neg liver ROS, GERD  Medicated and Controlled,  Endo/Other  negative endocrine ROS  Renal/GU negative Renal ROS  negative genitourinary   Musculoskeletal negative musculoskeletal ROS (+)   Abdominal (+)  Abdomen: soft.    Peds  Hematology negative hematology ROS (+)   Anesthesia Other Findings   Reproductive/Obstetrics H/o right breast CA                            Anesthesia Physical Anesthesia Plan  ASA: 2  Anesthesia Plan: General   Post-op Pain Management:    Induction: Intravenous  PONV Risk Score and Plan: 4 or greater and Midazolam, Dexamethasone, Ondansetron, Propofol infusion and Scopolamine patch - Pre-op  Airway Management Planned: Oral ETT  Additional Equipment: None  Intra-op Plan:   Post-operative Plan: Extubation in OR  Informed Consent: I have reviewed the patients History and Physical, chart, labs and discussed the procedure including the risks, benefits and alternatives for the proposed anesthesia with the patient or authorized representative who has indicated his/her understanding and acceptance.     Dental advisory given  Plan Discussed with: CRNA  Anesthesia Plan Comments:       Anesthesia Quick Evaluation

## 2021-06-26 ENCOUNTER — Other Ambulatory Visit: Payer: Self-pay

## 2021-06-26 ENCOUNTER — Encounter (HOSPITAL_BASED_OUTPATIENT_CLINIC_OR_DEPARTMENT_OTHER)
Admission: RE | Disposition: A | Discharge status: Home / Self Care | Payer: Self-pay | Source: Home / Self Care | Attending: Plastic Surgery

## 2021-06-26 ENCOUNTER — Ambulatory Visit (HOSPITAL_BASED_OUTPATIENT_CLINIC_OR_DEPARTMENT_OTHER): Payer: No Typology Code available for payment source | Admitting: Certified Registered"

## 2021-06-26 ENCOUNTER — Ambulatory Visit (HOSPITAL_BASED_OUTPATIENT_CLINIC_OR_DEPARTMENT_OTHER)
Admission: RE | Admit: 2021-06-26 | Discharge: 2021-06-26 | Disposition: A | Discharge status: Home / Self Care | Payer: No Typology Code available for payment source | Attending: Plastic Surgery | Admitting: Plastic Surgery

## 2021-06-26 ENCOUNTER — Encounter (HOSPITAL_BASED_OUTPATIENT_CLINIC_OR_DEPARTMENT_OTHER): Payer: Self-pay | Admitting: Plastic Surgery

## 2021-06-26 DIAGNOSIS — Z79899 Other long term (current) drug therapy: Secondary | ICD-10-CM | POA: Insufficient documentation

## 2021-06-26 DIAGNOSIS — Z923 Personal history of irradiation: Secondary | ICD-10-CM | POA: Diagnosis not present

## 2021-06-26 DIAGNOSIS — Z87891 Personal history of nicotine dependence: Secondary | ICD-10-CM | POA: Diagnosis not present

## 2021-06-26 DIAGNOSIS — Z9221 Personal history of antineoplastic chemotherapy: Secondary | ICD-10-CM | POA: Insufficient documentation

## 2021-06-26 DIAGNOSIS — Z853 Personal history of malignant neoplasm of breast: Secondary | ICD-10-CM | POA: Diagnosis not present

## 2021-06-26 DIAGNOSIS — N651 Disproportion of reconstructed breast: Secondary | ICD-10-CM | POA: Insufficient documentation

## 2021-06-26 DIAGNOSIS — K219 Gastro-esophageal reflux disease without esophagitis: Secondary | ICD-10-CM | POA: Insufficient documentation

## 2021-06-26 HISTORY — PX: BREAST RECONSTRUCTION: SHX9

## 2021-06-26 HISTORY — PX: LIPOSUCTION WITH LIPOFILLING: SHX6436

## 2021-06-26 SURGERY — RECONSTRUCTION, BREAST
Anesthesia: General | Laterality: Left

## 2021-06-26 MED ORDER — DEXAMETHASONE SODIUM PHOSPHATE 10 MG/ML IJ SOLN
INTRAMUSCULAR | Status: AC
  Filled 2021-06-26: qty 2

## 2021-06-26 MED ORDER — SCOPOLAMINE 1 MG/3DAYS TD PT72
1.0000 | MEDICATED_PATCH | TRANSDERMAL | Status: DC
  Administered 2021-06-26: 1.5 mg via TRANSDERMAL

## 2021-06-26 MED ORDER — BUPIVACAINE HCL (PF) 0.25 % IJ SOLN
INTRAMUSCULAR | Status: AC
  Filled 2021-06-26: qty 60

## 2021-06-26 MED ORDER — PHENYLEPHRINE HCL (PRESSORS) 10 MG/ML IV SOLN
INTRAVENOUS | Status: DC | PRN
  Administered 2021-06-26 (×2): 40 ug via INTRAVENOUS

## 2021-06-26 MED ORDER — CEFAZOLIN SODIUM-DEXTROSE 2-4 GM/100ML-% IV SOLN
INTRAVENOUS | Status: AC
  Filled 2021-06-26: qty 100

## 2021-06-26 MED ORDER — LIDOCAINE-EPINEPHRINE 1 %-1:100000 IJ SOLN
INTRAMUSCULAR | Status: AC
  Filled 2021-06-26: qty 2

## 2021-06-26 MED ORDER — ONDANSETRON HCL 4 MG/2ML IJ SOLN
INTRAMUSCULAR | Status: DC | PRN
  Administered 2021-06-26: 4 mg via INTRAVENOUS

## 2021-06-26 MED ORDER — LACTATED RINGERS IV SOLN: INTRAVENOUS | Status: DC

## 2021-06-26 MED ORDER — MIDAZOLAM HCL 2 MG/2ML IJ SOLN
INTRAMUSCULAR | Status: AC
  Filled 2021-06-26: qty 2

## 2021-06-26 MED ORDER — PHENYLEPHRINE 40 MCG/ML (10ML) SYRINGE FOR IV PUSH (FOR BLOOD PRESSURE SUPPORT)
PREFILLED_SYRINGE | INTRAVENOUS | Status: AC
  Filled 2021-06-26: qty 10

## 2021-06-26 MED ORDER — CHLORHEXIDINE GLUCONATE CLOTH 2 % EX PADS: 6.0000 | MEDICATED_PAD | Freq: Once | CUTANEOUS | Status: DC

## 2021-06-26 MED ORDER — BUPIVACAINE HCL (PF) 0.5 % IJ SOLN
INTRAMUSCULAR | Status: AC
  Filled 2021-06-26: qty 30

## 2021-06-26 MED ORDER — SCOPOLAMINE 1 MG/3DAYS TD PT72
MEDICATED_PATCH | TRANSDERMAL | Status: AC
  Filled 2021-06-26: qty 1

## 2021-06-26 MED ORDER — SODIUM CHLORIDE (PF) 0.9 % IJ SOLN
INTRAMUSCULAR | Status: DC | PRN
  Administered 2021-06-26: 14 mL

## 2021-06-26 MED ORDER — EPINEPHRINE PF 1 MG/ML IJ SOLN
INTRAMUSCULAR | Status: AC
  Filled 2021-06-26: qty 2

## 2021-06-26 MED ORDER — MIDAZOLAM HCL 5 MG/5ML IJ SOLN
INTRAMUSCULAR | Status: DC | PRN
  Administered 2021-06-26: 2 mg via INTRAVENOUS

## 2021-06-26 MED ORDER — FENTANYL CITRATE (PF) 100 MCG/2ML IJ SOLN
INTRAMUSCULAR | Status: DC | PRN
  Administered 2021-06-26: 25 ug via INTRAVENOUS
  Administered 2021-06-26: 100 ug via INTRAVENOUS

## 2021-06-26 MED ORDER — OXYCODONE HCL 5 MG PO TABS
ORAL_TABLET | ORAL | Status: AC
  Filled 2021-06-26: qty 1

## 2021-06-26 MED ORDER — ONDANSETRON HCL 4 MG/2ML IJ SOLN
INTRAMUSCULAR | Status: AC
  Filled 2021-06-26: qty 8

## 2021-06-26 MED ORDER — ROCURONIUM BROMIDE 10 MG/ML (PF) SYRINGE
PREFILLED_SYRINGE | INTRAVENOUS | Status: AC
  Filled 2021-06-26: qty 10

## 2021-06-26 MED ORDER — GLYCOPYRROLATE PF 0.2 MG/ML IJ SOSY
PREFILLED_SYRINGE | INTRAMUSCULAR | Status: AC
  Filled 2021-06-26: qty 1

## 2021-06-26 MED ORDER — LIDOCAINE HCL (CARDIAC) PF 100 MG/5ML IV SOSY
PREFILLED_SYRINGE | INTRAVENOUS | Status: DC | PRN
  Administered 2021-06-26: 60 mg via INTRAVENOUS

## 2021-06-26 MED ORDER — ROCURONIUM BROMIDE 100 MG/10ML IV SOLN
INTRAVENOUS | Status: DC | PRN
  Administered 2021-06-26: 70 mg via INTRAVENOUS

## 2021-06-26 MED ORDER — CEFAZOLIN SODIUM-DEXTROSE 2-4 GM/100ML-% IV SOLN: 2.0000 g | INTRAVENOUS | Status: DC

## 2021-06-26 MED ORDER — DEXAMETHASONE SODIUM PHOSPHATE 4 MG/ML IJ SOLN
INTRAMUSCULAR | Status: DC | PRN
  Administered 2021-06-26: 10 mg via INTRAVENOUS

## 2021-06-26 MED ORDER — SUGAMMADEX SODIUM 200 MG/2ML IV SOLN
INTRAVENOUS | Status: DC | PRN
  Administered 2021-06-26: 200 mg via INTRAVENOUS

## 2021-06-26 MED ORDER — LIDOCAINE HCL (PF) 1 % IJ SOLN
INTRAMUSCULAR | Status: DC | PRN
  Administered 2021-06-26: 40 mL

## 2021-06-26 MED ORDER — LIDOCAINE HCL (PF) 1 % IJ SOLN
INTRAMUSCULAR | Status: AC
  Filled 2021-06-26: qty 60

## 2021-06-26 MED ORDER — PROPOFOL 500 MG/50ML IV EMUL
INTRAVENOUS | Status: AC
  Filled 2021-06-26: qty 150

## 2021-06-26 MED ORDER — PROPOFOL 10 MG/ML IV BOLUS
INTRAVENOUS | Status: DC | PRN
  Administered 2021-06-26: 170 mg via INTRAVENOUS

## 2021-06-26 MED ORDER — FENTANYL CITRATE (PF) 100 MCG/2ML IJ SOLN
25.0000 ug | INTRAMUSCULAR | Status: DC | PRN
  Administered 2021-06-26: 25 ug via INTRAVENOUS

## 2021-06-26 MED ORDER — FENTANYL CITRATE (PF) 100 MCG/2ML IJ SOLN
INTRAMUSCULAR | Status: AC
  Filled 2021-06-26: qty 2

## 2021-06-26 MED ORDER — BUPIVACAINE LIPOSOME 1.3 % IJ SUSP
INTRAMUSCULAR | Status: DC | PRN
  Administered 2021-06-26: 7 mL

## 2021-06-26 MED ORDER — BUPIVACAINE LIPOSOME 1.3 % IJ SUSP
INTRAMUSCULAR | Status: AC
  Filled 2021-06-26: qty 40

## 2021-06-26 MED ORDER — LIDOCAINE 2% (20 MG/ML) 5 ML SYRINGE
INTRAMUSCULAR | Status: AC
  Filled 2021-06-26: qty 5

## 2021-06-26 MED ORDER — OXYCODONE HCL 5 MG PO TABS
5.0000 mg | ORAL_TABLET | Freq: Once | ORAL | Status: AC
  Administered 2021-06-26: 5 mg via ORAL

## 2021-06-26 MED ORDER — ACETAMINOPHEN 500 MG PO TABS
ORAL_TABLET | ORAL | Status: AC
  Filled 2021-06-26: qty 2

## 2021-06-26 MED ORDER — ACETAMINOPHEN 500 MG PO TABS
1000.0000 mg | ORAL_TABLET | Freq: Once | ORAL | Status: AC
  Administered 2021-06-26: 1000 mg via ORAL

## 2021-06-26 MED ORDER — EPINEPHRINE PF 1 MG/ML IJ SOLN
INTRAMUSCULAR | Status: DC | PRN
  Administered 2021-06-26: 2 mg

## 2021-06-26 MED ORDER — PROPOFOL 500 MG/50ML IV EMUL
INTRAVENOUS | Status: DC | PRN
  Administered 2021-06-26: 125 ug/kg/min via INTRAVENOUS

## 2021-06-26 MED ORDER — BACITRACIN ZINC 500 UNIT/GM EX OINT
TOPICAL_OINTMENT | CUTANEOUS | Status: AC
  Filled 2021-06-26: qty 28.35

## 2021-06-26 MED ORDER — CEFAZOLIN SODIUM-DEXTROSE 2-4 GM/100ML-% IV SOLN
2.0000 g | INTRAVENOUS | Status: AC
  Administered 2021-06-26: 2 g via INTRAVENOUS

## 2021-06-26 MED ORDER — DEXMEDETOMIDINE (PRECEDEX) IN NS 20 MCG/5ML (4 MCG/ML) IV SYRINGE
PREFILLED_SYRINGE | INTRAVENOUS | Status: AC
  Filled 2021-06-26: qty 5

## 2021-06-26 MED ORDER — GLYCOPYRROLATE 0.2 MG/ML IJ SOLN
INTRAMUSCULAR | Status: DC | PRN
  Administered 2021-06-26: .1 mg via INTRAVENOUS

## 2021-06-26 MED ORDER — TRANEXAMIC ACID-NACL 1000-0.7 MG/100ML-% IV SOLN
INTRAVENOUS | Status: AC
  Filled 2021-06-26: qty 100

## 2021-06-26 SURGICAL SUPPLY — 83 items
APL SKNCLS STERI-STRIP NONHPOA (GAUZE/BANDAGES/DRESSINGS) ×2
BENZOIN TINCTURE PRP APPL 2/3 (GAUZE/BANDAGES/DRESSINGS) ×4 IMPLANT
BINDER ABDOMINAL  9 SM 30-45 (SOFTGOODS)
BINDER ABDOMINAL 10 UNV 27-48 (MISCELLANEOUS) ×1 IMPLANT
BINDER ABDOMINAL 12 SM 30-45 (SOFTGOODS) IMPLANT
BINDER ABDOMINAL 9 SM 30-45 (SOFTGOODS) IMPLANT
BLADE HEX COATED 2.75 (ELECTRODE) ×2 IMPLANT
BLADE KNIFE PERSONA 10 (BLADE) IMPLANT
BLADE KNIFE PERSONA 15 (BLADE) ×4 IMPLANT
BNDG GAUZE ELAST 4 BULKY (GAUZE/BANDAGES/DRESSINGS) ×2 IMPLANT
CANISTER LIPO FAT HARVEST (MISCELLANEOUS) IMPLANT
CANISTER SUCT 1200ML W/VALVE (MISCELLANEOUS) ×2 IMPLANT
CAP BOUFFANT 24 BLUE NURSES (PROTECTIVE WEAR) ×2 IMPLANT
COVER BACK TABLE 60X90IN (DRAPES) ×2 IMPLANT
COVER MAYO STAND STRL (DRAPES) ×2 IMPLANT
DECANTER SPIKE VIAL GLASS SM (MISCELLANEOUS) ×2 IMPLANT
DRAIN CHANNEL 10M FLAT 3/4 FLT (DRAIN) IMPLANT
DRAIN CHANNEL 7F 3/4 FLAT (WOUND CARE) IMPLANT
DRAPE LAPAROSCOPIC ABDOMINAL (DRAPES) ×2 IMPLANT
DRAPE U-SHAPE 76X120 STRL (DRAPES) ×4 IMPLANT
DRSG EMULSION OIL 3X3 NADH (GAUZE/BANDAGES/DRESSINGS) IMPLANT
DRSG PAD ABDOMINAL 8X10 ST (GAUZE/BANDAGES/DRESSINGS) ×4 IMPLANT
DRSG TELFA 3X8 NADH (GAUZE/BANDAGES/DRESSINGS) IMPLANT
ELECT REM PT RETURN 9FT ADLT (ELECTROSURGICAL) ×2
ELECTRODE REM PT RTRN 9FT ADLT (ELECTROSURGICAL) ×1 IMPLANT
EVACUATOR SILICONE 100CC (DRAIN) IMPLANT
EXTRACTOR CANIST REVOLVE STRL (CANNISTER) IMPLANT
GAUZE SPONGE 4X4 12PLY STRL (GAUZE/BANDAGES/DRESSINGS) ×3 IMPLANT
GLOVE SURG ENC MOIS LTX SZ7 (GLOVE) ×2 IMPLANT
GLOVE SURG UNDER POLY LF SZ6.5 (GLOVE) IMPLANT
GOWN STRL REUS W/ TWL LRG LVL3 (GOWN DISPOSABLE) ×2 IMPLANT
GOWN STRL REUS W/TWL LRG LVL3 (GOWN DISPOSABLE) ×4
IV CONNECTOR CLAVE PORT MALE (IV SETS) ×2 IMPLANT
IV LACTATED RINGERS 1000ML (IV SOLUTION) ×4 IMPLANT
IV NS 500ML (IV SOLUTION)
IV NS 500ML BAXH (IV SOLUTION) IMPLANT
LINER CANISTER 1000CC FLEX (MISCELLANEOUS) ×2 IMPLANT
MANIFOLD NEPTUNE II (INSTRUMENTS) IMPLANT
MARKER SKIN DUAL TIP RULER LAB (MISCELLANEOUS) ×2 IMPLANT
NDL HYPO 25X1 1.5 SAFETY (NEEDLE) ×2 IMPLANT
NDL SAFETY ECLIPSE 18X1.5 (NEEDLE) IMPLANT
NDL SPNL 18GX3.5 QUINCKE PK (NEEDLE) ×1 IMPLANT
NEEDLE HYPO 18GX1.5 SHARP (NEEDLE)
NEEDLE HYPO 25X1 1.5 SAFETY (NEEDLE) ×4 IMPLANT
NEEDLE SPNL 18GX3.5 QUINCKE PK (NEEDLE) ×2 IMPLANT
NS IRRIG 1000ML POUR BTL (IV SOLUTION) ×4 IMPLANT
PACK BASIN DAY SURGERY FS (CUSTOM PROCEDURE TRAY) ×2 IMPLANT
PAD DRESSING TELFA 3X8 NADH (GAUZE/BANDAGES/DRESSINGS) IMPLANT
PENCIL SMOKE EVACUATOR (MISCELLANEOUS) ×2 IMPLANT
PIN SAFETY STERILE (MISCELLANEOUS) IMPLANT
SCRUB TECHNI CARE 4 OZ NO DYE (MISCELLANEOUS) ×2 IMPLANT
SHEET MEDIUM DRAPE 40X70 STRL (DRAPES) ×4 IMPLANT
SLEEVE SCD COMPRESS KNEE MED (STOCKING) ×2 IMPLANT
SPECIMEN JAR MEDIUM (MISCELLANEOUS) ×6 IMPLANT
SPONGE T-LAP 18X18 ~~LOC~~+RFID (SPONGE) ×6 IMPLANT
STAPLER VISISTAT 35W (STAPLE) ×2 IMPLANT
STRIP CLOSURE SKIN 1/2X4 (GAUZE/BANDAGES/DRESSINGS) ×4 IMPLANT
SUT ETHIBOND 3-0 V-5 (SUTURE) IMPLANT
SUT ETHILON 3 0 PS 1 (SUTURE) ×2 IMPLANT
SUT MERSILENE 2.0 SH NDLE (SUTURE) IMPLANT
SUT MNCRL AB 3-0 PS2 18 (SUTURE) ×2 IMPLANT
SUT MNCRL AB 4-0 PS2 18 (SUTURE) ×4 IMPLANT
SUT MON AB 5-0 PS2 18 (SUTURE) IMPLANT
SUT PROLENE 3 0 PS 1 (SUTURE) IMPLANT
SUT SILK 2 0 PERMA HAND 18 BK (SUTURE) ×1 IMPLANT
SUT VLOC 90 P-14 23 (SUTURE) IMPLANT
SYR 10ML LL (SYRINGE) ×6 IMPLANT
SYR 20ML LL LF (SYRINGE) IMPLANT
SYR 50ML LL SCALE MARK (SYRINGE) ×2 IMPLANT
SYR BULB IRRIG 60ML STRL (SYRINGE) ×4 IMPLANT
SYR CONTROL 10ML LL (SYRINGE) ×6 IMPLANT
SYR TOOMEY 50ML (SYRINGE) ×4 IMPLANT
TAPE MEASURE VINYL STERILE (MISCELLANEOUS) ×2 IMPLANT
TOWEL GREEN STERILE FF (TOWEL DISPOSABLE) ×6 IMPLANT
TRAY DSU PREP LF (CUSTOM PROCEDURE TRAY) ×3 IMPLANT
TRAY FOL W/BAG SLVR 16FR STRL (SET/KITS/TRAYS/PACK) IMPLANT
TRAY FOLEY W/BAG SLVR 14FR LF (SET/KITS/TRAYS/PACK) ×1 IMPLANT
TRAY FOLEY W/BAG SLVR 16FR LF (SET/KITS/TRAYS/PACK)
TUBE CONNECTING 20X1/4 (TUBING) ×2 IMPLANT
TUBING INFILTRATION IT-10001 (TUBING) IMPLANT
TUBING SET GRADUATE ASPIR 12FT (MISCELLANEOUS) ×2 IMPLANT
UNDERPAD 30X36 HEAVY ABSORB (UNDERPADS AND DIAPERS) ×4 IMPLANT
YANKAUER SUCT BULB TIP NO VENT (SUCTIONS) ×2 IMPLANT

## 2021-06-26 NOTE — Discharge Instructions (Addendum)
Next dose of Tylenol given at 1:00pm if needed.   Post Anesthesia Home Care Instructions  Activity: Get plenty of rest for the remainder of the day. A responsible individual must stay with you for 24 hours following the procedure.  For the next 24 hours, DO NOT: -Drive a car -Paediatric nurse -Drink alcoholic beverages -Take any medication unless instructed by your physician -Make any legal decisions or sign important papers.  Meals: Start with liquid foods such as gelatin or soup. Progress to regular foods as tolerated. Avoid greasy, spicy, heavy foods. If nausea and/or vomiting occur, drink only clear liquids until the nausea and/or vomiting subsides. Call your physician if vomiting continues.  Special Instructions/Symptoms: Your throat may feel dry or sore from the anesthesia or the breathing tube placed in your throat during surgery. If this causes discomfort, gargle with warm salt water. The discomfort should disappear within 24 hours.  If you had a scopolamine patch placed behind your ear for the management of post- operative nausea and/or vomiting:  1. The medication in the patch is effective for 72 hours, after which it should be removed.  Wrap patch in a tissue and discard in the trash. Wash hands thoroughly with soap and water. 2. You may remove the patch earlier than 72 hours if you experience unpleasant side effects which may include dry mouth, dizziness or visual disturbances. 3. Avoid touching the patch. Wash your hands with soap and water after contact with the patch.  Information for Discharge Teaching: EXPAREL (bupivacaine liposome injectable suspension)   Your surgeon or anesthesiologist gave you EXPAREL(bupivacaine) to help control your pain after surgery.  EXPAREL is a local anesthetic that provides pain relief by numbing the tissue around the surgical site. EXPAREL is designed to release pain medication over time and can control pain for up to 72 hours. Depending  on how you respond to EXPAREL, you may require less pain medication during your recovery.  Possible side effects: Temporary loss of sensation or ability to move in the area where bupivacaine was injected. Nausea, vomiting, constipation Rarely, numbness and tingling in your mouth or lips, lightheadedness, or anxiety may occur. Call your doctor right away if you think you may be experiencing any of these sensations, or if you have other questions regarding possible side effects.  Follow all other discharge instructions given to you by your surgeon or nurse. Eat a healthy diet and drink plenty of water or other fluids.  If you return to the hospital for any reason within 96 hours following the administration of EXPAREL, it is important for health care providers to know that you have received this anesthetic. A teal colored band has been placed on your arm with the date, time and amount of EXPAREL you have received in order to alert and inform your health care providers. Please leave this armband in place for the full 96 hours following administration, and then you may remove the band.   1. No lifting greater than 5 lbs with arms for 4 weeks. 2.  Percocet 5/325 mg tabs 1-2 tabs po q 4-6 hours prn pain- prescription given in office. 3.  Keflex 500 mg. tab 1 tab po four times a day -  prescription given in office. 4. Sterapred dose pack as directed- prescription given in office. 5. Follow-up appointment Friday in office.

## 2021-06-26 NOTE — Anesthesia Procedure Notes (Signed)
Procedure Name: Intubation Date/Time: 06/26/2021 8:02 AM Performed by: Signe Colt, CRNA Pre-anesthesia Checklist: Patient identified, Emergency Drugs available, Suction available and Patient being monitored Patient Re-evaluated:Patient Re-evaluated prior to induction Oxygen Delivery Method: Circle system utilized Preoxygenation: Pre-oxygenation with 100% oxygen Induction Type: IV induction Ventilation: Mask ventilation without difficulty Laryngoscope Size: Mac and 3 Grade View: Grade II Tube type: Oral Tube size: 7.0 mm Number of attempts: 1 Airway Equipment and Method: Stylet and Oral airway Placement Confirmation: ETT inserted through vocal cords under direct vision, positive ETCO2 and breath sounds checked- equal and bilateral Secured at: 21 cm Tube secured with: Tape Dental Injury: Teeth and Oropharynx as per pre-operative assessment

## 2021-06-26 NOTE — Anesthesia Postprocedure Evaluation (Signed)
Anesthesia Post Note  Patient: [name]  Procedure(s) Performed: BREAST RECONSTRUCTION WITH FAT TRANSFER (Left) LIPOSUCTION WITH LIPOFILLING (Left)     Patient location during evaluation: PACU Anesthesia Type: General Level of consciousness: sedated Pain management: pain level controlled Vital Signs Assessment: post-procedure vital signs reviewed and stable Respiratory status: spontaneous breathing and respiratory function stable Cardiovascular status: stable Postop Assessment: no apparent nausea or vomiting Anesthetic complications: no   No notable events documented.  Last Vitals:  Vitals:   06/26/21 1154 06/26/21 1228  BP: 136/90 135/88  Pulse: 63 73  Resp: 12 14  Temp:  (!) 36.4 C  SpO2: 93% 94%    Last Pain:  Vitals:   06/26/21 1228  TempSrc:   PainSc: 4                  Layney Gillson DANIEL

## 2021-06-26 NOTE — Brief Op Note (Signed)
06/26/2021  11:15 AM  PATIENT:  [name]  60 y.o. female  PRE-OPERATIVE DIAGNOSIS:  1) RIGHT BREAST CANCER 2) BREAST ASYMMETRY  POST-OPERATIVE DIAGNOSIS:  1) RIGHT BREAST CANCER 2) BREAST ASYMMETRY  PROCEDURE:  Procedure(s): BREAST RECONSTRUCTION WITH FAT TRANSFER (Left) LIPOSUCTION WITH LIPOFILLING (Left)  SURGEON:  Surgeon(s) and Role:    * Riane Rung, Audrea Muscat, MD - Primary  ASSISTANTS: none   ANESTHESIA:   general  EBL:  Minimal  BLOOD ADMINISTERED:none  DRAINS: none   LOCAL MEDICATIONS USED:  MARCAINE     SPECIMEN:  No Specimen  DISPOSITION OF SPECIMEN:  N/A  COUNTS:  YES  DICTATION: Other Dictation #[id number]  PLAN OF CARE: Discharge to home after PACU  PATIENT DISPOSITION:  PACU - hemodynamically stable.   Delay start of Pharmacological VTE agent (>24hrs) due to surgical blood loss or risk of bleeding: not applicable

## 2021-06-26 NOTE — H&P (Signed)
  H&P faxed to surgical center.  -History and Physical Reviewed  -Patient has been re-examined  -No change in the plan of care  [name] A    

## 2021-06-26 NOTE — Transfer of Care (Signed)
Immediate Anesthesia Transfer of Care Note  Patient: [name]  Procedure(s) Performed: BREAST RECONSTRUCTION WITH FAT TRANSFER (Left) LIPOSUCTION WITH LIPOFILLING (Left)  Patient Location: PACU  Anesthesia Type:General  Level of Consciousness: awake, alert , oriented and patient cooperative  Airway & Oxygen Therapy: Patient Spontanous Breathing and Patient connected to face mask oxygen  Post-op Assessment: Report given to RN and Post -op Vital signs reviewed and stable  Post vital signs: Reviewed and stable  Last Vitals:  Vitals Value Taken Time  BP 135/89 06/26/21 1123  Temp    Pulse 68 06/26/21 1124  Resp 17 06/26/21 1124  SpO2 95 % 06/26/21 1124  Vitals shown include unvalidated device data.  Last Pain:  Vitals:   06/26/21 0645  TempSrc: Oral  PainSc: 0-No pain      Patients Stated Pain Goal: 6 (18/86/77 3736)  Complications: No notable events documented.

## 2021-06-28 ENCOUNTER — Encounter (HOSPITAL_BASED_OUTPATIENT_CLINIC_OR_DEPARTMENT_OTHER): Payer: Self-pay | Admitting: Plastic Surgery

## 2021-08-04 NOTE — Op Note (Signed)
NAME: [name], [name] MEDICAL RECORD NO: [id number] ACCOUNT NO: [id number] DATE OF BIRTH: 1961-09-27 FACILITY: [facility] LOCATION: [facility] PHYSICIAN: Gregor Dershem A. Carly Freeman, MD  Operative Report   DATE OF PROCEDURE: 06/26/2021  PREOPERATIVE DIAGNOSES:   1.  History of right breast cancer, status post lumpectomy, radiation therapy. 2.  Breast asymmetry, status post right breast cancer surgery.  POSTOPERATIVE DIAGNOSES:   1.  History of right breast cancer, status post lumpectomy, radiation therapy. 2.  Breast asymmetry, status post right breast cancer surgery.  PROCEDURE:  Left breast reconstruction with micro fat grafting for symmetry procedure after right breast cancer reconstructive surgery.  ATTENDING SURGEON:  [name] A. Jacquelina Hewins, MD  ANESTHESIA:  General.  INDICATIONS FOR THE PROCEDURE:  The patient is a 60 year old Caucasian female who has a history of right breast cancer.  She underwent a lumpectomy followed by radiation therapy.  As a result, she had significant fibrosis, scarring and a decrease in  volume of the right breast compared to the left.  The right breast has been reconstructed using micro fat grafting.  She also had a significant breast asymmetry between the right and left breast due to the right breast cancer and the surgery for  treatment of the breast cancer.  She has previously undergone a left mastopexy, but now presents for micro fat grafting of the upper pole of the left breast to better match the contours of the right reconstructed breast.  DESCRIPTION OF PROCEDURE:  The patient was marked in the preoperative holding area for the future surgery.  She was then taken back into the OR and placed on the OR table, sitting up on the table.  The patient's back and axillary areas were prepped with  Hibiclens.  She was then placed in the supine position, lying down on a sterile sheet for the surgery.  At that point, general anesthesia was induced.  Then, the patient's  anterior chest and axillary areas as well as her abdomen down to the pubic areas  were prepped with Hibiclens solution.  The patient was then draped in sterile fashion and the procedure was begun.  First, tumescent fluid was infiltrated into the abdominal and flank areas in the amount as noted on accompanying nursing notes.  This was  in preparation for the liposuction of these areas to obtain the fat harvest for the micro fat grafting.  The abdomen as well as the flank areas were then liposuctioned using a power-assisted liposuction.  The harvested fat was then properly processed and  micro fat grafting proceeded of the upper pole of the left breast.  After a total of about 185 mL of micro fat grafting was performed, it appeared that there was a nice contour of the left upper pole of the breast and that this did have good symmetry  with the right breast contour.  I then proceeded to close the small injection sites for the micro fat grafting using 4-0 Monocryl suture.  The incisions that were used for the liposuction and fat harvesting were then also closed using 4-0 Monocryl in the  dermal layer, followed by 4-0 Monocryl running intracuticular stitch on the skin.  The incisions were dressed with Steri-Strips.  There were no complications.  She tolerated the procedure well.  The patient was then placed into the light postoperative  support bra along with 4 x 4's for a dressing.  4 x 4s were also placed over the areas of the liposuction incisions.  She was placed into an abdominal binder for  postoperative compression.  There were no complications.  The patient tolerated the  procedure well.  She was then awakened from the general anesthesia and taken to the recovery room in stable condition.  Final needle and sponge counts were reported to be correct at the end of the case.  The patient was also recovered without complications.  She was then discharged home in the care of her husband/significant other in  stable condition.  Followup appointment will be in the office within a couple of days.     PAA D: 08/03/2021 10:31:50 pm T: 08/04/2021 2:26:00 am  JOB: [id number]/ [id number]

## 2021-08-21 DIAGNOSIS — L821 Other seborrheic keratosis: Secondary | ICD-10-CM | POA: Diagnosis not present

## 2021-08-21 DIAGNOSIS — L814 Other melanin hyperpigmentation: Secondary | ICD-10-CM | POA: Diagnosis not present

## 2021-08-21 DIAGNOSIS — Z86018 Personal history of other benign neoplasm: Secondary | ICD-10-CM | POA: Diagnosis not present

## 2021-08-21 DIAGNOSIS — Z808 Family history of malignant neoplasm of other organs or systems: Secondary | ICD-10-CM | POA: Diagnosis not present

## 2021-09-04 DIAGNOSIS — R7303 Prediabetes: Secondary | ICD-10-CM | POA: Diagnosis not present

## 2021-09-04 DIAGNOSIS — E782 Mixed hyperlipidemia: Secondary | ICD-10-CM | POA: Diagnosis not present

## 2021-09-04 DIAGNOSIS — E010 Iodine-deficiency related diffuse (endemic) goiter: Secondary | ICD-10-CM | POA: Diagnosis not present

## 2021-09-27 DIAGNOSIS — Z780 Asymptomatic menopausal state: Secondary | ICD-10-CM | POA: Diagnosis not present

## 2021-09-27 DIAGNOSIS — M8589 Other specified disorders of bone density and structure, multiple sites: Secondary | ICD-10-CM | POA: Diagnosis not present

## 2022-10-18 ENCOUNTER — Other Ambulatory Visit: Payer: Self-pay

## 2022-10-22 ENCOUNTER — Encounter: Payer: Self-pay | Admitting: Internal Medicine

## 2022-10-23 ENCOUNTER — Telehealth: Payer: Self-pay | Admitting: Hematology and Oncology

## 2022-10-23 NOTE — Telephone Encounter (Signed)
Spoke to patient to confirm upcoming afternoon [facility] clinic appointment on 1/31 paperwork will be sent via [place].   Gave location and time, also informed patient that the surgeon's office would be calling as well to get information from them similar to the packet that they will be receiving so make sure to do both.  Reminded patient that all providers will be coming to the clinic to see them HERE and if they had any questions to not hesitate to reach back out to myself or their navigators.

## 2022-10-28 ENCOUNTER — Encounter: Payer: Self-pay | Admitting: *Deleted

## 2022-10-28 DIAGNOSIS — C50412 Malignant neoplasm of upper-outer quadrant of left female breast: Secondary | ICD-10-CM | POA: Insufficient documentation

## 2022-10-28 NOTE — Progress Notes (Signed)
Radiation Oncology         ([phone]) [phone] ________________________________  Name: [name]        MRN: [id number]  Date of Service: 10/30/2022 DOB: 1960-11-04  AU:QJFHLKT, Margaretha Sheffield, MD  [name], MD     REFERRING PHYSICIAN: [name], MD   DIAGNOSIS: The encounter diagnosis was Primary malignant neoplasm of upper outer quadrant of breast, left ([place]).   HISTORY OF PRESENT ILLNESS: [name] is a 62 y.o. female seen in the multidisciplinary breast clinic for a new diagnosis of left breast cancer. The patient has a history of her right breast cancer treated in 2000 with lumpectomy.  She received***.  Recently she was being seen for screening mammogram which identified an asymmetry in the left breast.  Further diagnostic workup included an ultrasound which identified a 1.1 cm mass in the left breast at 4:00 and an additional 9 mm mass in the 4 o'clock position as well in the retroareolar aspect of the breast.  There was also a 3 mm benign-appearing cyst noted in the 3 o'clock position along with 2 other irregular masses possibly microcysts.  The axilla was***she underwent biopsies on 10/18/2022.  Her specimen in the 4 o'clock position showed a solid papillary carcinoma that was ER/PR positive.  Biopsy at 3:00 also showed a grade 1 invasive ductal carcinoma with focal background of solid papillary carcinoma that was ER/PR positive HER2 negative with a Ki-67 of less than 1%.  She is seen today to discuss treatment recommendations of her cancer.    PREVIOUS RADIATION THERAPY: {EXAM; YES/NO:19492::"No"}   PAST MEDICAL HISTORY:  Past Medical History:  Diagnosis Date   Breast asymmetry 08/2018   Cancer [facility]) 2000   breast right, lumpectomy, chemo and radiation   Family history of colon cancer    Family history of melanoma    GERD (gastroesophageal reflux disease)    no meds   Personal history of malignant neoplasm of breast 03/14/2021   PONV (postoperative nausea and vomiting)         PAST SURGICAL HISTORY: Past Surgical History:  Procedure Laterality Date   BREAST RECONSTRUCTION Right 07/23/2018   Procedure: RIGHT  BREAST RECONSTRUCTION;  Surgeon: Youlanda Roys, MD;  Location: [place];  Service: Plastics;  Laterality: Right;   BREAST RECONSTRUCTION Bilateral 01/31/2020   Procedure: BREAST RECONSTRUCTION;  Surgeon: Youlanda Roys, MD;  Location: [place];  Service: Plastics;  Laterality: Bilateral;   BREAST RECONSTRUCTION Left 06/26/2021   Procedure: BREAST RECONSTRUCTION WITH FAT TRANSFER;  Surgeon: Youlanda Roys, MD;  Location: [place];  Service: Plastics;  Laterality: Left;   BREAST SURGERY Right 2000   lumpectomy, sentinel node removed   CESAREAN SECTION     x 1   COLONOSCOPY WITH PROPOFOL N/A 05/21/2016   Procedure: COLONOSCOPY WITH PROPOFOL;  Surgeon: Garlan Fair, MD;  Location: [facility];  Service: Endoscopy;  Laterality: N/A;   LIPOSUCTION WITH LIPOFILLING Bilateral 07/23/2018   Procedure: LIPOSUCTION AND FAT GRAFTING;  Surgeon: Contogiannis, Audrea Muscat, MD;  Location: [place];  Service: Plastics;  Laterality: Bilateral;  TO RIGHT BREAST   LIPOSUCTION WITH LIPOFILLING Right 09/28/2018   Procedure: ABDOMEN, HIPS AND FLANKS LIPOSUCTION WITH MICRO FAT GRAFTING TO RIGHT BREAST;  Surgeon: Youlanda Roys, MD;  Location: [place];  Service: Plastics;  Laterality: Right;   LIPOSUCTION WITH LIPOFILLING Right 03/02/2019   Procedure: LIPOSUCTION OF ABDOMEN HIPS AND FLANKS WITH FAT GRAFTING  TO RIGHT BREAT;  Surgeon: Contogiannis, Audrea Muscat, MD;  Location: [place];  Service: Plastics;  Laterality: Right;   LIPOSUCTION WITH LIPOFILLING Bilateral 01/31/2020   Procedure: LIPOSUCTION WITH LIPOFILLING;  Surgeon: Contogiannis, Audrea Muscat, MD;  Location: [place];  Service: Plastics;  Laterality: Bilateral;   LIPOSUCTION WITH  LIPOFILLING Left 07/18/2020   Procedure: MICRO FAT GRAFTING USING LIPOSUCTION FROM ABDOMEN AND BILATERAL FLANKS;  Surgeon: Contogiannis, Audrea Muscat, MD;  Location: [place];  Service: Plastics;  Laterality: Left;   LIPOSUCTION WITH LIPOFILLING Left 06/26/2021   Procedure: LIPOSUCTION WITH LIPOFILLING;  Surgeon: Contogiannis, Audrea Muscat, MD;  Location: [place];  Service: Plastics;  Laterality: Left;   MASTOPEXY Left 07/13/2019   Procedure: LEFT BREAST MASTOPEXY;  Surgeon: Nathanial Rancher Audrea Muscat, MD;  Location: [place];  Service: Plastics;  Laterality: Left;   TUBAL LIGATION       FAMILY HISTORY:  Family History  Problem Relation Age of Onset   Colon cancer Mother        dx in her 88s   Alcohol abuse Father    Lung cancer Maternal Aunt    Cancer Maternal Uncle        d. 94s   Cancer Paternal Aunt        NOS   Colon cancer Maternal Grandmother        dx in her 71s   Cancer Maternal Grandfather        NOS   Alcohol abuse Paternal Grandfather    Melanoma Half-Sister        dx in her 82s     SOCIAL HISTORY:  reports that she quit smoking about 36 years ago. Her smoking use included cigarettes. She has a 10.00 pack-year smoking history. She has never used smokeless tobacco. She reports current alcohol use. She reports that she does not use drugs.  The patient is divorced and lives in [place].  She works for [employer] as a***   ALLERGIES: Latex and Other   MEDICATIONS:  Current Outpatient Medications  Medication Sig Dispense Refill   diphenhydrAMINE HCl (BENADRYL PO) Take by mouth at bedtime.     Metamucil Fiber CHEW Chew by mouth.     Multiple Vitamin (MULTIVITAMIN WITH MINERALS) TABS tablet Take 1 tablet by mouth daily.     omeprazole (PRILOSEC) 20 MG capsule Take 20 mg by mouth daily.     vitamin C (ASCORBIC ACID) 500 MG tablet Take 500 mg by mouth daily.     No current facility-administered medications  for this visit.     REVIEW OF SYSTEMS: On review of systems, the patient reports that she is doing ***     PHYSICAL EXAM:  Wt Readings from Last 3 Encounters:  06/26/21 179 lb 14.3 oz (81.6 kg)  07/18/20 166 lb 10.7 oz (75.6 kg)  01/31/20 161 lb 13.1 oz (73.4 kg)   Temp Readings from Last 3 Encounters:  06/26/21 (!) 97.5 F (36.4 C)  07/18/20 (!) 97.5 F (36.4 C)  01/31/20 97.9 F (36.6 C)   BP Readings from Last 3 Encounters:  06/26/21 135/88  07/18/20 (!) 149/87  01/31/20 126/68   Pulse Readings from Last 3 Encounters:  06/26/21 73  07/18/20 85  01/31/20 88    In general this is a well appearing *** female in no acute distress. She's alert and oriented x4 and appropriate throughout the examination. Cardiopulmonary assessment is negative for acute distress and she  exhibits normal effort. Bilateral breast exam is deferred.    ECOG = ***  0 - Asymptomatic (Fully active, able to carry on all predisease activities without restriction)  1 - Symptomatic but completely ambulatory (Restricted in physically strenuous activity but ambulatory and able to carry out work of a light or sedentary nature. For example, light housework, office work)  2 - Symptomatic, <50% in bed during the day (Ambulatory and capable of all self care but unable to carry out any work activities. Up and about more than 50% of waking hours)  3 - Symptomatic, >50% in bed, but not bedbound (Capable of only limited self-care, confined to bed or chair 50% or more of waking hours)  4 - Bedbound (Completely disabled. Cannot carry on any self-care. Totally confined to bed or chair)  5 - Death   Eustace Pen MM, Creech RH, Tormey DC, et al. [phone]). "Toxicity and response criteria of the [facility]". [place] Oncol. 5 (6): 649-55    LABORATORY DATA:  Lab Results  Component Value Date   WBC 6.6 04/14/2018   HGB 12.9 04/14/2018   HCT 38.5 04/14/2018   MCV 93.2 04/14/2018   PLT 319  04/14/2018   Lab Results  Component Value Date   NA 142 04/14/2018   K 3.6 04/14/2018   CL 108 04/14/2018   CO2 25 04/14/2018   Lab Results  Component Value Date   ALT 33 04/14/2018   AST 35 04/14/2018   ALKPHOS 79 04/14/2018   BILITOT 1.0 04/14/2018      RADIOGRAPHY: No results found.     IMPRESSION/PLAN: 1. At least Stage IA, [id number], grade 1, ER/PR positive invasive ductal carcinoma of the left breast. Dr. Lisbeth Renshaw discusses the pathology findings and reviews the nature of *** breast disease. The consensus from the breast conference includes breast conservation with lumpectomy with *** sentinel node biopsy. Depending on the size of the final tumor measurements rendered by pathology, the tumor may be tested for Oncotype Dx score to determine a role for systemic therapy. Provided that chemotherapy is not indicated, the patient's course would then be followed by external radiotherapy to the breast  to reduce risks of local recurrence followed by antiestrogen therapy. We discussed the risks, benefits, short, and long term effects of radiotherapy, as well as the curative intent, and the patient is interested in proceeding. Dr. Lisbeth Renshaw discusses the delivery and logistics of radiotherapy and anticipates a course of *** weeks of radiotherapy. We will see her back a few weeks after surgery to discuss the simulation process and anticipate we starting radiotherapy about 4-6 weeks after surgery.  2. History of right breast cancer*** 3. Possible genetic predisposition to malignancy. The patient has been a candidate for genetic testing given her personal and family history, but since she has updated genetics, she does not need additional testing at this time. ***   In a visit lasting *** minutes, greater than 50% of the time was spent face to face reviewing her case, as well as in preparation of, discussing, and coordinating the patient's care.  The above documentation reflects my direct findings  during this shared patient visit. Please see the separate note by Dr. Lisbeth Renshaw on this date for the remainder of the patient's plan of care.    Carola Rhine, [facility]    **Disclaimer: This note was dictated with voice recognition software. Similar sounding words can inadvertently be transcribed and this note may contain transcription errors which may not have  been corrected upon publication of note.**

## 2022-10-30 ENCOUNTER — Ambulatory Visit: Payer: No Typology Code available for payment source | Attending: General Surgery | Admitting: Physical Therapy

## 2022-10-30 ENCOUNTER — Other Ambulatory Visit: Payer: Self-pay

## 2022-10-30 ENCOUNTER — Encounter: Payer: Self-pay | Admitting: Physical Therapy

## 2022-10-30 ENCOUNTER — Encounter: Payer: Self-pay | Admitting: *Deleted

## 2022-10-30 ENCOUNTER — Other Ambulatory Visit: Payer: Self-pay | Admitting: *Deleted

## 2022-10-30 ENCOUNTER — Inpatient Hospital Stay: Payer: No Typology Code available for payment source | Admitting: Hematology and Oncology

## 2022-10-30 ENCOUNTER — Inpatient Hospital Stay: Payer: No Typology Code available for payment source | Attending: Nurse Practitioner

## 2022-10-30 ENCOUNTER — Ambulatory Visit
Admission: RE | Admit: 2022-10-30 | Discharge: 2022-10-30 | Disposition: A | Discharge status: Home / Self Care | Payer: No Typology Code available for payment source | Source: Ambulatory Visit | Attending: Radiation Oncology | Admitting: Radiation Oncology

## 2022-10-30 VITALS — BP 152/84 | HR 72 | Temp 98.1°F | Resp 15 | Wt 180.5 lb

## 2022-10-30 DIAGNOSIS — Z809 Family history of malignant neoplasm, unspecified: Secondary | ICD-10-CM | POA: Insufficient documentation

## 2022-10-30 DIAGNOSIS — R293 Abnormal posture: Secondary | ICD-10-CM

## 2022-10-30 DIAGNOSIS — Z170 Estrogen receptor positive status [ER+]: Secondary | ICD-10-CM | POA: Insufficient documentation

## 2022-10-30 DIAGNOSIS — Z800 Family history of malignant neoplasm of digestive organs: Secondary | ICD-10-CM | POA: Insufficient documentation

## 2022-10-30 DIAGNOSIS — Z808 Family history of malignant neoplasm of other organs or systems: Secondary | ICD-10-CM | POA: Insufficient documentation

## 2022-10-30 DIAGNOSIS — C50512 Malignant neoplasm of lower-outer quadrant of left female breast: Secondary | ICD-10-CM | POA: Diagnosis not present

## 2022-10-30 DIAGNOSIS — Z79899 Other long term (current) drug therapy: Secondary | ICD-10-CM | POA: Diagnosis not present

## 2022-10-30 DIAGNOSIS — Z9221 Personal history of antineoplastic chemotherapy: Secondary | ICD-10-CM | POA: Insufficient documentation

## 2022-10-30 DIAGNOSIS — C50412 Malignant neoplasm of upper-outer quadrant of left female breast: Secondary | ICD-10-CM

## 2022-10-30 DIAGNOSIS — Z87891 Personal history of nicotine dependence: Secondary | ICD-10-CM

## 2022-10-30 DIAGNOSIS — Z853 Personal history of malignant neoplasm of breast: Secondary | ICD-10-CM | POA: Diagnosis not present

## 2022-10-30 DIAGNOSIS — Z801 Family history of malignant neoplasm of trachea, bronchus and lung: Secondary | ICD-10-CM | POA: Insufficient documentation

## 2022-10-30 LAB — CBC WITH DIFFERENTIAL (CANCER CENTER ONLY)
Abs Immature Granulocytes: 0.01 10*3/uL (ref 0.00–0.07)
Basophils Absolute: 0 10*3/uL (ref 0.0–0.1)
Basophils Relative: 1 %
Eosinophils Absolute: 0.2 10*3/uL (ref 0.0–0.5)
Eosinophils Relative: 3 %
HCT: 38.1 % (ref 36.0–46.0)
Hemoglobin: 13.1 g/dL (ref 12.0–15.0)
Immature Granulocytes: 0 %
Lymphocytes Relative: 39 %
Lymphs Abs: 2.1 10*3/uL (ref 0.7–4.0)
MCH: 31.3 pg (ref 26.0–34.0)
MCHC: 34.4 g/dL (ref 30.0–36.0)
MCV: 91.1 fL (ref 80.0–100.0)
Monocytes Absolute: 0.4 10*3/uL (ref 0.1–1.0)
Monocytes Relative: 8 %
Neutro Abs: 2.7 10*3/uL (ref 1.7–7.7)
Neutrophils Relative %: 49 %
Platelet Count: 295 10*3/uL (ref 150–400)
RBC: 4.18 MIL/uL (ref 3.87–5.11)
RDW: 12.8 % (ref 11.5–15.5)
WBC Count: 5.5 10*3/uL (ref 4.0–10.5)
nRBC: 0 % (ref 0.0–0.2)

## 2022-10-30 LAB — CMP (CANCER CENTER ONLY)
ALT: 22 U/L (ref 0–44)
AST: 21 U/L (ref 15–41)
Albumin: 4.2 g/dL (ref 3.5–5.0)
Alkaline Phosphatase: 59 U/L (ref 38–126)
Anion gap: 6 (ref 5–15)
BUN: 15 mg/dL (ref 8–23)
CO2: 31 mmol/L (ref 22–32)
Calcium: 10.5 mg/dL — ABNORMAL HIGH (ref 8.9–10.3)
Chloride: 103 mmol/L (ref 98–111)
Creatinine: 0.86 mg/dL (ref 0.44–1.00)
GFR, Estimated: >60 mL/min (ref >60)
Glucose, Bld: 102 mg/dL — ABNORMAL HIGH (ref 70–99)
Potassium: 3.5 mmol/L (ref 3.5–5.1)
Sodium: 140 mmol/L (ref 135–145)
Total Bilirubin: 0.4 mg/dL (ref 0.3–1.2)
Total Protein: 7.8 g/dL (ref 6.5–8.1)

## 2022-10-30 LAB — GENETIC SCREENING ORDER

## 2022-10-30 NOTE — Assessment & Plan Note (Signed)
2000: History of right breast cancer treated with lumpectomy ER/PR positive HER2 negative, adjuvant chemo (4 cycles of TC) 5 years of tamoxifen  10/18/2022: Screening mammogram detected left breast focal asymmetry left upper outer quadrant.  Ultrasound: 4 o'clock position: 1.1 cm solid papillary cancer (like DCIS) ER 100%, PR 15%, additionally 2 adjacent masses measuring 2 cm at 3 o'clock position on biopsy came back grade 1 IDC with micropapillary features, ER 100%, PR 100%, Ki-67 less than 1%, HER2 negative 1+  Pathology and radiology counseling:Discussed with the patient, the details of pathology including the type of breast cancer,the clinical staging, the significance of ER, PR and HER-2/neu receptors and the implications for treatment. After reviewing the pathology in detail, we proceeded to discuss the different treatment options between surgery, radiation, chemotherapy, antiestrogen therapies.  Recommendations: Breast MRI 1.  Double lumpectomy followed by 2. Oncotype DX testing to determine if chemotherapy would be of any benefit followed by 3. Adjuvant radiation therapy followed by 4. Adjuvant antiestrogen therapy  Oncotype counseling: I discussed Oncotype DX test. I explained to the patient that this is a 21 gene panel to evaluate patient tumors DNA to calculate recurrence score. This would help determine whether patient has high risk or low risk breast cancer. She understands that if her tumor was found to be high risk, she would benefit from systemic chemotherapy. If low risk, no need of chemotherapy.  Return to clinic after surgery to discuss final pathology report and then determine if Oncotype DX testing will need to be sent.

## 2022-10-30 NOTE — Research (Signed)
[employer] 2021-05 - Specimen Collection Study to Evaluate Biomarkers in Subjects with Cancer     Patient [name] was identified by Dr. Lindi Adie as a potential candidate for the above listed study.  This Clinical Research Coordinator met with [name], [id number], on 10/30/22 in a manner and location that ensures patient privacy to discuss participation in the above listed research study.  Patient is Accompanied by her daughter .  A copy of the informed consent document with embedded HIPAA language was provided to the patient.  Patient reads, speaks, and understands [place].   Patient was provided with the business card of this Coordinator and encouraged to contact the research team with any questions.  Approximately 15 minutes were spent with the patient reviewing the informed consent documents.  Patient was provided the option of taking informed consent documents home to review and was encouraged to review at their convenience with their support network, including other care providers. Patient took the consent documents home to review.  Will follow up with the patient on 11/04/2022 to confirm interest in the study.  Johny Drilling, [facility] 10/30/2022 4:39 PM

## 2022-10-30 NOTE — Therapy (Signed)
OUTPATIENT PHYSICAL THERAPY BREAST CANCER BASELINE EVALUATION   Patient Name: [name] MRN: [id number] DOB:January 06, 1961, 62 y.o., female Today's Date: 10/30/2022  END OF SESSION:  PT End of Session - 10/30/22 1521     Visit Number 1    Number of Visits 2    Date for PT Re-Evaluation 12/25/22    PT Start Time 1287    PT Stop Time 1507    PT Time Calculation (min) 28 min    Activity Tolerance Patient tolerated treatment well    Behavior During Therapy [facility] for tasks assessed/performed             Past Medical History:  Diagnosis Date   Breast asymmetry 08/2018   Cancer ([place]) 2000   breast right, lumpectomy, chemo and radiation   Family history of colon cancer    Family history of melanoma    GERD (gastroesophageal reflux disease)    no meds   Personal history of malignant neoplasm of breast 03/14/2021   PONV (postoperative nausea and vomiting)    Past Surgical History:  Procedure Laterality Date   BREAST RECONSTRUCTION Right 07/23/2018   Procedure: RIGHT  BREAST RECONSTRUCTION;  Surgeon: Youlanda Roys, MD;  Location: [place];  Service: Plastics;  Laterality: Right;   BREAST RECONSTRUCTION Bilateral 01/31/2020   Procedure: BREAST RECONSTRUCTION;  Surgeon: Youlanda Roys, MD;  Location: [place];  Service: Plastics;  Laterality: Bilateral;   BREAST RECONSTRUCTION Left 06/26/2021   Procedure: BREAST RECONSTRUCTION WITH FAT TRANSFER;  Surgeon: Youlanda Roys, MD;  Location: [place];  Service: Plastics;  Laterality: Left;   BREAST SURGERY Right 2000   lumpectomy, sentinel node removed   CESAREAN SECTION     x 1   COLONOSCOPY WITH PROPOFOL N/A 05/21/2016   Procedure: COLONOSCOPY WITH PROPOFOL;  Surgeon: Garlan Fair, MD;  Location: [facility];  Service: Endoscopy;  Laterality: N/A;   LIPOSUCTION WITH LIPOFILLING Bilateral 07/23/2018   Procedure: LIPOSUCTION AND FAT GRAFTING;  Surgeon:  Contogiannis, Audrea Muscat, MD;  Location: [place];  Service: Plastics;  Laterality: Bilateral;  TO RIGHT BREAST   LIPOSUCTION WITH LIPOFILLING Right 09/28/2018   Procedure: ABDOMEN, HIPS AND FLANKS LIPOSUCTION WITH MICRO FAT GRAFTING TO RIGHT BREAST;  Surgeon: Youlanda Roys, MD;  Location: [place];  Service: Plastics;  Laterality: Right;   LIPOSUCTION WITH LIPOFILLING Right 03/02/2019   Procedure: LIPOSUCTION OF ABDOMEN HIPS AND FLANKS WITH FAT GRAFTING TO RIGHT BREAT;  Surgeon: Contogiannis, Audrea Muscat, MD;  Location: [place];  Service: Plastics;  Laterality: Right;   LIPOSUCTION WITH LIPOFILLING Bilateral 01/31/2020   Procedure: LIPOSUCTION WITH LIPOFILLING;  Surgeon: Contogiannis, Audrea Muscat, MD;  Location: [place];  Service: Plastics;  Laterality: Bilateral;   LIPOSUCTION WITH LIPOFILLING Left 07/18/2020   Procedure: MICRO FAT GRAFTING USING LIPOSUCTION FROM ABDOMEN AND BILATERAL FLANKS;  Surgeon: Contogiannis, Audrea Muscat, MD;  Location: [place];  Service: Plastics;  Laterality: Left;   LIPOSUCTION WITH LIPOFILLING Left 06/26/2021   Procedure: LIPOSUCTION WITH LIPOFILLING;  Surgeon: Contogiannis, Audrea Muscat, MD;  Location: [place];  Service: Plastics;  Laterality: Left;   MASTOPEXY Left 07/13/2019   Procedure: LEFT BREAST MASTOPEXY;  Surgeon: Nathanial Rancher Audrea Muscat, MD;  Location: [place];  Service: Plastics;  Laterality: Left;   TUBAL LIGATION     Patient Active Problem List   Diagnosis Date Noted   Primary malignant neoplasm of  upper outer quadrant of breast, left ([place]) 10/28/2022   Genetic testing 04/03/2021   Family history of colon cancer 03/14/2021   Family history of melanoma 03/14/2021   Personal history of malignant neoplasm of breast 03/14/2021    REFERRING PROVIDER: Dr. Ashley Mariner  REFERRING DIAG: Left breast cancer  THERAPY DIAG:  Malignant neoplasm of  lower-outer quadrant of left breast of female, estrogen receptor positive ([place])  Abnormal posture  Rationale for Evaluation and Treatment: Rehabilitation  ONSET DATE: 10/03/2022  SUBJECTIVE:                                                                                                                                                                                           SUBJECTIVE STATEMENT: Patient reports she is here today to be seen by her medical team for her newly diagnosed left breast cancer.   PERTINENT HISTORY:  Patient was diagnosed on 10/03/2022 with left grade 1 invasive ductal carcinoma breast cancer. It measures 1.1 cm with 2 other small areas measuring 2 cm total and is located in the lower outer  quadrant. It is ER/PR positive and HER2 negative with a Ki67 of <1%. She had right breast cancer in 2000 with a right lumpectomy and sentinel node biopsy (2 negative nodes removed), chemotherapy and radiation. No hx right arm lymphedema.  PATIENT GOALS:   reduce lymphedema risk and learn post op HEP.   PAIN:  Are you having pain? No  PRECAUTIONS: Active CA Other: Right arm also at risk for lymphedema but surgery was 24 years ago  HAND DOMINANCE: right  WEIGHT BEARING RESTRICTIONS: No  FALLS:  Has patient fallen in last 6 months? No  LIVING ENVIRONMENT: Patient lives with: alone Lives in: House/apartment Has following equipment at home: None  OCCUPATION: [employer]  LEISURE: She does a 40 min exercise class 2-3x/week, Pilates once a week, and walks on the other days 30-40 min  PRIOR LEVEL OF FUNCTION: Independent   OBJECTIVE:  COGNITION: Overall cognitive status: Within functional limits for tasks assessed    POSTURE:  Forward head and rounded shoulders posture  UPPER EXTREMITY AROM/PROM:  A/PROM RIGHT   eval   Shoulder extension 51  Shoulder flexion 154  Shoulder abduction 160  Shoulder internal rotation 52  Shoulder external rotation 73    (Blank  rows = not tested)  A/PROM LEFT   eval  Shoulder extension 53  Shoulder flexion 142  Shoulder abduction 152  Shoulder internal rotation 64  Shoulder external rotation 67    (Blank rows = not tested)  CERVICAL AROM: All within normal limits:    Percent limited  Flexion WNL  Extension WNL  Right lateral flexion 25% limited  Left lateral flexion WNL  Right rotation WNL  Left rotation WNL    UPPER EXTREMITY STRENGTH: WFL  LYMPHEDEMA ASSESSMENTS:   LANDMARK RIGHT   eval  10 cm proximal to olecranon process 29  Olecranon process 25.9  10 cm proximal to ulnar styloid process 23.1  Just proximal to ulnar styloid process 16.4  Across hand at thumb web space 19.7  At base of 2nd digit 6.7  (Blank rows = not tested)  LANDMARK LEFT   eval  10 cm proximal to olecranon process 29.1  Olecranon process 26.1  10 cm proximal to ulnar styloid process 23  Just proximal to ulnar styloid process 16.3  Across hand at thumb web space 19  At base of 2nd digit 6.4  (Blank rows = not tested)  L-DEX LYMPHEDEMA SCREENING:  The patient was assessed using the L-Dex machine today to produce a lymphedema index baseline score. The patient will be reassessed on a regular basis (typically every 3 months) to obtain new L-Dex scores. If the score is > 6.5 points away from his/her baseline score indicating onset of subclinical lymphedema, it will be recommended to wear a compression garment for 4 weeks, 12 hours per day and then be reassessed. If the score continues to be > 6.5 points from baseline at reassessment, we will initiate lymphedema treatment. Assessing in this manner has a 95% rate of preventing clinically significant lymphedema.   L-DEX FLOWSHEETS - 10/30/22 1500       L-DEX LYMPHEDEMA SCREENING   Measurement Type Unilateral    L-DEX MEASUREMENT EXTREMITY Upper Extremity    POSITION  Standing    DOMINANT SIDE Right    At Risk Side Left    BASELINE SCORE (UNILATERAL) -0.9              QUICK DASH SURVEY:  Katina Dung - 10/30/22 0001     Open a tight or new jar No difficulty    Do heavy household chores (wash walls, wash floors) No difficulty    Carry a shopping bag or briefcase No difficulty    Wash your back No difficulty    Use a knife to cut food No difficulty    Recreational activities in which you take some force or impact through your arm, shoulder, or hand (golf, hammering, tennis) No difficulty    During the past week, to what extent has your arm, shoulder or hand problem interfered with your normal social activities with family, friends, neighbors, or groups? Not at all    During the past week, to what extent has your arm, shoulder or hand problem limited your work or other regular daily activities Not at all    Arm, shoulder, or hand pain. None    Tingling (pins and needles) in your arm, shoulder, or hand None    Difficulty Sleeping No difficulty    DASH Score 0 %              PATIENT EDUCATION:  Education details: Lymphedema risk reduction and post op shoulder/posture HEP Person educated: Patient Education method: Explanation, Demonstration, Handout Education comprehension: Patient verbalized understanding and returned demonstration  HOME EXERCISE PROGRAM: Patient was instructed today in a home exercise program today for post op shoulder range of motion. These included active assist shoulder flexion in sitting, scapular retraction, wall walking with shoulder abduction, and hands behind head external rotation.  She was encouraged to do these twice a day, holding 3  seconds and repeating 5 times when permitted by her physician.   ASSESSMENT:  CLINICAL IMPRESSION: Patient was diagnosed on 10/03/2022 with left grade 1 invasive ductal carcinoma breast cancer. It measures 1.1 cm with 2 other small areas measuring 2 cm total and is located in the lower outer  quadrant. It is ER/PR positive and HER2 negative with a Ki67 of <1%. She had right breast  cancer in 2000 with a right lumpectomy and sentinel node biopsy (2 negative nodes removed), chemotherapy and radiation. No hx right arm lymphedema.Her multidisciplinary medical team met prior to her assessments to determine a recommended treatment plan. She is planning to have an MRI, left lumpectomy and sentinel node biopsy, Oncotype testing, radiation, and anti-estrogen therapy. She will benefit from a post op PT reassessment to determine needs and from L-Dex screens every 3 months for 2 years to detect subclinical lymphedema.  Pt will benefit from skilled therapeutic intervention to improve on the following deficits: Decreased knowledge of precautions, impaired UE functional use, pain, decreased ROM, postural dysfunction.   PT treatment/interventions: ADL/self-care home management, pt/family education, therapeutic exercise  REHAB POTENTIAL: Excellent  CLINICAL DECISION MAKING: Stable/uncomplicated  EVALUATION COMPLEXITY: Low   GOALS: Goals reviewed with patient? YES  LONG TERM GOALS: (STG=LTG)    Name Target Date Goal status  1 Pt will be able to verbalize understanding of pertinent lymphedema risk reduction practices relevant to her dx specifically related to skin care.  Baseline:  No knowledge 10/30/2022 Achieved at eval  2 Pt will be able to return demo and/or verbalize understanding of the post op HEP related to regaining shoulder ROM. Baseline:  No knowledge 10/30/2022 Achieved at eval  3 Pt will be able to verbalize understanding of the importance of attending the post op After Breast CA Class for further lymphedema risk reduction education and therapeutic exercise.  Baseline:  No knowledge 10/30/2022 Achieved at eval  4 Pt will demo she has regained full shoulder ROM and function post operatively compared to baselines.  Baseline: See objective measurements taken today. 12/25/2022     PLAN:  PT FREQUENCY/DURATION: EVAL and 1 follow up appointment.   PLAN FOR NEXT SESSION: will  reassess 3-4 weeks post op to determine needs.   Patient will follow up at outpatient cancer rehab 3-4 weeks following surgery.  If the patient requires physical therapy at that time, a specific plan will be dictated and sent to the referring physician for approval. The patient was educated today on appropriate basic range of motion exercises to begin post operatively and the importance of attending the After Breast Cancer class following surgery.  Patient was educated today on lymphedema risk reduction practices as it pertains to recommendations that will benefit the patient immediately following surgery.  She verbalized good understanding.    Physical Therapy Information for After Breast Cancer Surgery/Treatment:  Lymphedema is a swelling condition that you may be at risk for in your arm if you have lymph nodes removed from the armpit area.  After a sentinel node biopsy, the risk is approximately 5-9% and is higher after an axillary node dissection.  There is treatment available for this condition and it is not life-threatening.  Contact your physician or physical therapist with concerns. You may begin the 4 shoulder/posture exercises (see additional sheet) when permitted by your physician (typically a week after surgery).  If you have drains, you may need to wait until those are removed before beginning range of motion exercises.  A general recommendation is to not  lift your arms above shoulder height until drains are removed.  These exercises should be done to your tolerance and gently.  This is not a "no pain/no gain" type of recovery so listen to your body and stretch into the range of motion that you can tolerate, stopping if you have pain.  If you are having immediate reconstruction, ask your plastic surgeon about doing exercises as he or she may want you to wait. We encourage you to attend the free one time ABC (After Breast Cancer) class offered by [facility].  You will  learn information related to lymphedema risk, prevention and treatment and additional exercises to regain mobility following surgery.  You can call [phone] for more information.  This is offered the 1st and 3rd Monday of each month.  You only attend the class one time. While undergoing any medical procedure or treatment, try to avoid blood pressure being taken or needle sticks from occurring on the arm on the side of cancer.   This recommendation begins after surgery and continues for the rest of your life.  This may help reduce your risk of getting lymphedema (swelling in your arm). An excellent resource for those seeking information on lymphedema is the National Lymphedema Network's web site. It can be accessed at [place].org If you notice swelling in your hand, arm or breast at any time following surgery (even if it is many years from now), please contact your doctor or physical therapist to discuss this.  Lymphedema can be treated at any time but it is easier for you if it is treated early on.  If you feel like your shoulder motion is not returning to normal in a reasonable amount of time, please contact your surgeon or physical therapist.  [facility] [phone]. [address], [place] [address]  ABC CLASS After Breast Cancer Class  After Breast Cancer Class is a specially designed exercise class to assist you in a safe recover after having breast cancer surgery.  In this class you will learn how to get back to full function whether your drains were just removed or if you had surgery a month ago.  This one-time class is held the 1st and 3rd Monday of every month from 11:00 a.m. until 12:00 noon virtually.  This class is FREE and space is limited. For more information or to register for the next available class, call [phone].  Class Goals  Understand specific stretches to improve the flexibility of you chest and shoulder. Learn  ways to safely strengthen your upper body and improve your posture. Understand the warning signs of infection and why you may be at risk for an arm infection. Learn about Lymphedema and prevention.  ** You do not attend this class until after surgery.  Drains must be removed to participate  Patient was instructed today in a home exercise program today for post op shoulder range of motion. These included active assist shoulder flexion in sitting, scapular retraction, wall walking with shoulder abduction, and hands behind head external rotation.  She was encouraged to do these twice a day, holding 3 seconds and repeating 5 times when permitted by her physician.    Teems,MARTI COOPER, PT 10/30/2022, 3:22 PM

## 2022-10-30 NOTE — Progress Notes (Signed)
[facility] NOTE  Patient Care Team: [name], MD as PCP - General (Family Medicine) [name], MD as Consulting Physician (General Surgery) [name], MD as Consulting Physician (Hematology and Oncology) [name], MD as Consulting Physician (Radiation Oncology) [name], RN as Oncology Nurse Navigator [name], RN as Oncology Nurse Navigator  CHIEF COMPLAINTS/PURPOSE OF CONSULTATION:  Newly diagnosed breast cancer  HISTORY OF PRESENTING ILLNESS:  [name] 62 y.o. female is here because of recent diagnosis of left breast cancer.  Patient with prior history of right breast cancer in 2000.  Lumpectomy chemotherapy radiation and antiestrogen therapy with tamoxifen for 5 years.  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  Additional area at 4 o'clock position there were 2 nodules measuring 2 cm was noted on biopsy came back as grade 1 IDC with papillary features follows ER/PR positive HER2 negative with a Ki-67 of less than 1%.  She was presented this morning to the multidisciplinary tumor board and she is here today to discuss her treatment plan  I reviewed her records extensively and collaborated the history with the patient.  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  Ultrasound: 4 o'clock position: 1.1 cm solid papillary cancer (like DCIS) ER 100%, PR 15%, additionally  2 adjacent masses measuring 2 cm at 3 o'clock position on biopsy came back grade 1 IDC with micropapillary features, ER 100%, PR 100%, Ki-67 less than 1%, HER2 negative 1+   10/30/2022 Cancer Staging   Staging form: Breast, AJCC 8th Edition - Clinical stage from 10/30/2022: Stage IA (cT1b, cN0, cM0, G1, ER+, PR+, HER2-) - Signed by Hayden Pedro, PA-C on 10/30/2022 Method of lymph node assessment: Clinical Histologic grading system: 3 grade system      MEDICAL HISTORY:  Past Medical History:  Diagnosis Date   Breast asymmetry 08/2018   Cancer ([place]) 2000   breast right, lumpectomy, chemo and radiation   Family history of colon cancer    Family history of melanoma    GERD (gastroesophageal reflux disease)    no meds   Personal history of malignant neoplasm of breast 03/14/2021   PONV (postoperative nausea and vomiting)     SURGICAL HISTORY: Past Surgical History:  Procedure Laterality Date   BREAST RECONSTRUCTION Right 07/23/2018   Procedure: RIGHT  BREAST RECONSTRUCTION;  Surgeon: Youlanda Roys, MD;  Location: [facility];  Service: Plastics;  Laterality: Right;   BREAST RECONSTRUCTION Bilateral 01/31/2020   Procedure: BREAST RECONSTRUCTION;  Surgeon: Youlanda Roys, MD;  Location: [facility];  Service: Plastics;  Laterality: Bilateral;   BREAST RECONSTRUCTION Left 06/26/2021   Procedure: BREAST RECONSTRUCTION WITH FAT TRANSFER;  Surgeon: Youlanda Roys, MD;  Location: [facility];  Service: Plastics;  Laterality: Left;   BREAST SURGERY Right 2000   lumpectomy, sentinel node removed   CESAREAN SECTION     x 1   COLONOSCOPY WITH PROPOFOL N/A 05/21/2016  Procedure: COLONOSCOPY WITH PROPOFOL;  Surgeon: Garlan Fair, MD;  Location: [facility];  Service: Endoscopy;  Laterality: N/A;   LIPOSUCTION WITH LIPOFILLING Bilateral 07/23/2018   Procedure: LIPOSUCTION AND FAT GRAFTING;  Surgeon: Contogiannis, Audrea Muscat, MD;  Location: [facility];  Service: Plastics;  Laterality: Bilateral;  TO RIGHT BREAST   LIPOSUCTION WITH LIPOFILLING Right 09/28/2018   Procedure: ABDOMEN, HIPS AND FLANKS LIPOSUCTION WITH MICRO FAT GRAFTING TO RIGHT BREAST;  Surgeon: Youlanda Roys, MD;  Location: [facility];  Service: Plastics;  Laterality: Right;   LIPOSUCTION WITH LIPOFILLING Right 03/02/2019   Procedure: LIPOSUCTION OF ABDOMEN HIPS AND FLANKS WITH FAT GRAFTING TO RIGHT BREAT;  Surgeon: Contogiannis, Audrea Muscat, MD;  Location: [facility];  Service: Plastics;  Laterality: Right;   LIPOSUCTION WITH LIPOFILLING Bilateral 01/31/2020   Procedure: LIPOSUCTION WITH LIPOFILLING;  Surgeon: Contogiannis, Audrea Muscat, MD;  Location: [facility];  Service: Plastics;  Laterality: Bilateral;   LIPOSUCTION WITH LIPOFILLING Left 07/18/2020   Procedure: MICRO FAT GRAFTING USING LIPOSUCTION FROM ABDOMEN AND BILATERAL FLANKS;  Surgeon: Contogiannis, Audrea Muscat, MD;  Location: [facility];  Service: Plastics;  Laterality: Left;   LIPOSUCTION WITH LIPOFILLING Left 06/26/2021   Procedure: LIPOSUCTION WITH LIPOFILLING;  Surgeon: Contogiannis, Audrea Muscat, MD;  Location: [facility];  Service: Plastics;  Laterality: Left;   MASTOPEXY Left 07/13/2019   Procedure: LEFT BREAST MASTOPEXY;  Surgeon: Nathanial Rancher Audrea Muscat, MD;  Location: [facility];  Service: Plastics;  Laterality: Left;   TUBAL LIGATION      SOCIAL HISTORY: Social History   Socioeconomic History   Marital status: Divorced    Spouse name: Not on file   Number of children: Not on file   Years of education: Not on file   Highest education level: Not on file  Occupational History   Not on file  Tobacco Use   Smoking status: Former    Packs/day: 1.00    Years: 10.00    Total pack years: 10.00    Types: Cigarettes    Quit date: 09/30/1986    Years since quitting: 36.1   Smokeless  tobacco: Never  Vaping Use   Vaping Use: Never used  Substance and Sexual Activity   Alcohol use: Yes    Comment: occ   Drug use: Never   Sexual activity: Yes    Birth control/protection: Post-menopausal    Comment: BTL  Other Topics Concern   Not on file  Social History Narrative   Not on file   Social Determinants of Health   Financial Resource Strain: Not on file  Food Insecurity: Not on file  Transportation Needs: Not on file  Physical Activity: Not on file  Stress: Not on file  Social Connections: Not on file  Intimate Partner Violence: Not on file    FAMILY HISTORY: Family History  Problem Relation Age of Onset   Colon cancer Mother        dx in her 1s   Alcohol abuse Father    Lung cancer Maternal Aunt    Cancer Maternal Uncle        d. 49s   Cancer Paternal Aunt        NOS   Colon cancer Maternal Grandmother        dx in her 4s   Cancer Maternal Grandfather        NOS   Alcohol abuse Paternal Grandfather    Melanoma Half-Sister  dx in her 1s    ALLERGIES:  is allergic to latex and other.  MEDICATIONS:  Current Outpatient Medications  Medication Sig Dispense Refill   diphenhydrAMINE HCl (BENADRYL PO) Take by mouth at bedtime.     Metamucil Fiber CHEW Chew by mouth.     Multiple Vitamin (MULTIVITAMIN WITH MINERALS) TABS tablet Take 1 tablet by mouth daily.     omeprazole (PRILOSEC) 20 MG capsule Take 20 mg by mouth daily.     vitamin C (ASCORBIC ACID) 500 MG tablet Take 500 mg by mouth daily.     No current facility-administered medications for this visit.    REVIEW OF SYSTEMS:   Constitutional: Denies fevers, chills or abnormal night sweats Breast:  Denies any palpable lumps or discharge All other systems were reviewed with the patient and are negative.  PHYSICAL EXAMINATION: ECOG PERFORMANCE STATUS: 0 - Asymptomatic  Vitals:   10/30/22 1258  BP: (!) 152/84  Pulse: 72  Resp: 15  Temp: 98.1 F (36.7 C)  SpO2: 99%   Filed  Weights   10/30/22 1258  Weight: 180 lb 8 oz (81.9 kg)    GENERAL:alert, no distress and comfortable    LABORATORY DATA:  I have reviewed the data as listed Lab Results  Component Value Date   WBC 5.5 10/30/2022   HGB 13.1 10/30/2022   HCT 38.1 10/30/2022   MCV 91.1 10/30/2022   PLT 295 10/30/2022   Lab Results  Component Value Date   NA 140 10/30/2022   K 3.5 10/30/2022   CL 103 10/30/2022   CO2 31 10/30/2022    RADIOGRAPHIC STUDIES: I have personally reviewed the radiological reports and agreed with the findings in the report.  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  Ultrasound: 4 o'clock position: 1.1 cm solid papillary cancer (like DCIS) ER 100%, PR 15%, additionally 2 adjacent masses measuring 2 cm at 3 o'clock position on biopsy came back grade 1 IDC with micropapillary features, ER 100%, PR 100%, Ki-67 less than 1%, HER2 negative 1+  Pathology and radiology counseling:Discussed with the patient, the details of pathology including the type of breast cancer,the clinical staging, the significance of ER, PR and HER-2/neu receptors and the implications for treatment. After reviewing the pathology in detail, we proceeded to discuss the different treatment options between surgery, radiation, chemotherapy, antiestrogen therapies.  Recommendations: Breast MRI 1.  Double lumpectomy followed by 2. Oncotype DX testing to determine if chemotherapy would be of any benefit followed by 3. Adjuvant radiation therapy followed by 4. Adjuvant antiestrogen therapy  Oncotype counseling: I discussed Oncotype DX test. I explained to the patient that this is a 21 gene panel to evaluate patient tumors  DNA to calculate recurrence score. This would help determine whether patient has high risk or low risk breast cancer. She understands that if her tumor was found to be high risk, she would benefit from systemic chemotherapy. If low risk, no need of chemotherapy.  Return to clinic after surgery to discuss final pathology report and then determine if Oncotype DX testing will need to be sent.   All questions were answered. The patient knows to call the clinic with any problems, questions or concerns.    Harriette Ohara, MD 10/30/22

## 2022-10-31 ENCOUNTER — Encounter: Payer: Self-pay | Admitting: General Practice

## 2022-10-31 NOTE — Progress Notes (Signed)
[facility] Psychosocial Distress Screening Spiritual Care  Met with [name] by phone following [facility]idisciplinary [facility] to introduce [facility] team/resources, reviewing distress screen per protocol.  The patient scored a 6 on the Psychosocial Distress Thermometer which indicates moderate distress. Also assessed for distress and other psychosocial needs.      10/31/2022   11:26 AM  ONCBCN DISTRESS SCREENING  Screening Type Initial Screening  Distress experienced in past week (1-10) 6  Emotional problem type Adjusting to illness  Referral to support programs Yes    Chaplain and patient discussed common feelings and emotions when being diagnosed with cancer, and the importance of support during treatment.  Chaplain informed patient of the support team and support services at [facility].  Chaplain provided contact information and encouraged patient to call with any questions or concerns.  Ms [name] reports good support from faith and family, including her daughters and SO, describing herself as "very encouraged" by the "favorable prognostics." She is interested in the free massage therapy and reports no other needs at this time.  Follow up needed: No.   Chaplain Lorrin Jackson, [place], [facility] Pager [phone] Voicemail [phone]

## 2022-11-06 ENCOUNTER — Ambulatory Visit
Admission: RE | Admit: 2022-11-06 | Discharge: 2022-11-06 | Disposition: A | Discharge status: Home / Self Care | Payer: No Typology Code available for payment source | Source: Ambulatory Visit | Attending: Hematology and Oncology

## 2022-11-06 DIAGNOSIS — C50412 Malignant neoplasm of upper-outer quadrant of left female breast: Secondary | ICD-10-CM

## 2022-11-06 MED ORDER — GADOPICLENOL 0.5 MMOL/ML IV SOLN
9.0000 mL | Freq: Once | INTRAVENOUS | Status: AC | PRN
  Administered 2022-11-06: 9 mL via INTRAVENOUS

## 2022-11-07 ENCOUNTER — Encounter: Payer: Self-pay | Admitting: *Deleted

## 2022-11-07 ENCOUNTER — Other Ambulatory Visit: Payer: Self-pay | Admitting: Hematology and Oncology

## 2022-11-07 ENCOUNTER — Telehealth: Payer: Self-pay | Admitting: *Deleted

## 2022-11-07 DIAGNOSIS — R928 Other abnormal and inconclusive findings on diagnostic imaging of breast: Secondary | ICD-10-CM

## 2022-11-07 DIAGNOSIS — Z170 Estrogen receptor positive status [ER+]: Secondary | ICD-10-CM

## 2022-11-07 DIAGNOSIS — C50412 Malignant neoplasm of upper-outer quadrant of left female breast: Secondary | ICD-10-CM

## 2022-11-07 NOTE — Telephone Encounter (Signed)
Spoke with patient to follow up from [facility] 1/31 and assess navigation needs. Discussed MRI and the need for 2nd look u/s and possible bx.  [facility] will do this 2/16 at 1pm. Patient aware of appt. Informed her that if it wasn't seen by U/S then we would need to MR bx and told her I would go ahead and order this and if she gets a call to schedule to do it after 2/16 in case it needs to be cancelled. Patient verbalized understanding.

## 2022-11-08 ENCOUNTER — Inpatient Hospital Stay: Payer: No Typology Code available for payment source | Attending: Nurse Practitioner | Admitting: Radiology

## 2022-11-08 ENCOUNTER — Other Ambulatory Visit: Payer: Self-pay | Admitting: *Deleted

## 2022-11-08 ENCOUNTER — Inpatient Hospital Stay: Payer: No Typology Code available for payment source | Attending: Nurse Practitioner

## 2022-11-08 DIAGNOSIS — Z170 Estrogen receptor positive status [ER+]: Secondary | ICD-10-CM

## 2022-11-08 LAB — RESEARCH LABS

## 2022-11-08 NOTE — Research (Signed)
[employer] 2021-05 - Specimen Collection Study to Evaluate Biomarkers in Subjects with Cancer    Patient Carly [name] was identified by Dr. Lindi Adie as a potential candidate for the above listed study.  This Clinical Research Coordinator met with Carly [name], Carly [name] on 11/08/22 in a manner and location that ensures patient privacy to discuss participation in the above listed research study.  Patient is Unaccompanied.  Patient was previously provided with informed consent documents.  Patient confirmed they have read the informed consent documents.  As outlined in the informed consent form, this Coordinator and Carly [name] discussed the purpose of the research study, the investigational nature of the study, study procedures and requirements for study participation, potential risks and benefits of study participation, as well as alternatives to participation.  This study is not blinded or double-blinded. The patient understands participation is voluntary and they may withdraw from study participation at any time.  This study does not involve randomization.  This study does not involve an investigational drug or device. This study does not involve a placebo. Patient understands enrollment is pending full eligibility review.   Confidentiality and how the patient's information will be used as part of study participation were discussed.  Patient was informed there is not reimbursement provided for their time and effort spent on trial participation.  The patient is encouraged to discuss research study participation with their insurance provider to determine what costs they may incur as part of study participation, including research related injury.    All questions were answered to patient's satisfaction.  The informed consent with embedded HIPAA language was reviewed page by page.  The patient's mental and emotional status is appropriate to provide informed consent, and the patient verbalizes an  understanding of study participation.  Patient [name] agreed to participate in the above listed research study and [name] voluntarily signed the informed consent version Carly [name] with embedded HIPAA language, version Carly [name]  on 11/08/22 at 8:30AM.  The patient was provided with a copy of the signed informed consent form with embedded HIPAA language for their reference.  No study specific procedures were obtained prior to the signing of the informed consent document.  Approximately 30 minutes were spent with the patient reviewing the informed consent documents.  Patient was not requested to complete a Release of Information form.    This Coordinator [name] reviewed this patient's inclusion and exclusion criteria and confirmed Carly [name] is eligible for study participation.  Patient will continue with enrollment.  Menopausal status (women only): Carly [name] is postmenopausal    Eligibility confirmed by treating investigator, who also agrees that patient should proceed with enrollment.   Medical History:  High Blood Pressure  Yes Coronary Artery Disease No Lupus    No Rheumatoid Arthritis  No Diabetes   No      If yes, which type?      N/A Lynch Syndrome  No  Is the patient currently taking a magnesium supplement?   No If yes, dose and frequency? N/A Indication? N/A Start date? N/A  Does the patient have a personal history of cancer (greater than 5 years ago)?  Yes If yes, Cancer type and date of diagnosis?   Right breast cancer, 2000  [name] this previous diagnosis been treated? Yes      If so, treatment type? Adjuvant chemotherapy    Start and end dates of last treatment cycle? N/A  Does the patient have a family history of cancer in 1st or 2nd degree relatives? Yes If yes, Relationship(s) and Cancer type(s)? Mother- colon, sister- melanoma.   Does the patient have history of alcohol consumption? Yes   If yes, current or  former? Current  If former, year stopped? N/A Number of years? 30 Drinks per week? 4   Does the patient have history of cigarette, cigar, pipe, or chewing tobacco use?  Yes  If yes, current for former? former If yes, type (Cigarette, cigar, pipe, and/or chewing tobacco)? cigarettes   If former, year stopped? 1992  Number of years? 12 years Packs/number/containers per day? 1  Patient was taken to the lab to complete her blood collection for the study.   After a successful blood collection the patient was given Gift Card by Carly [name], Research Assistant     Patient was thanked for their time and participation. Patient's involvement in the study [name] been completed and research coordinator will request patient's tumor block and enter the required data.   Carly [name], [facility] 11/08/2022 9:17 AM

## 2022-11-12 ENCOUNTER — Encounter: Payer: Self-pay | Admitting: *Deleted

## 2022-11-15 ENCOUNTER — Other Ambulatory Visit: Payer: Self-pay

## 2022-11-19 ENCOUNTER — Encounter: Payer: Self-pay | Admitting: *Deleted

## 2022-11-19 ENCOUNTER — Telehealth: Payer: Self-pay | Admitting: *Deleted

## 2022-11-19 ENCOUNTER — Inpatient Hospital Stay (HOSPITAL_BASED_OUTPATIENT_CLINIC_OR_DEPARTMENT_OTHER): Payer: No Typology Code available for payment source | Admitting: Hematology and Oncology

## 2022-11-19 DIAGNOSIS — C50412 Malignant neoplasm of upper-outer quadrant of left female breast: Secondary | ICD-10-CM | POA: Diagnosis not present

## 2022-11-19 NOTE — Assessment & Plan Note (Signed)
2000: History of right breast cancer treated with lumpectomy ER/PR positive HER2 negative, adjuvant chemo (4 cycles of TC) 5 years of tamoxifen   10/18/2022: Screening mammogram detected left breast focal asymmetry left upper outer quadrant.  Ultrasound: 4 o'clock position: 1.1 cm solid papillary cancer (like DCIS) ER 100%, PR 15%, additionally 2 adjacent masses measuring 2 cm at 3 o'clock position on biopsy came back grade 1 IDC with micropapillary features, ER 100%, PR 100%, Ki-67 less than 1%, HER2 negative 1+  11/15/2022: Left breast ultrasound-guided biopsy 8:00: Grade 2 IDC  Recommendations: 1.  Double lumpectomy followed by 2. Oncotype DX testing to determine if chemotherapy would be of any benefit followed by 3. Adjuvant radiation therapy followed by 4. Adjuvant antiestrogen therapy  Since ultrasound-guided biopsy was positive we may cancel the MRI guided biopsy. Return to clinic after surgery to discuss the final pathology report.

## 2022-11-19 NOTE — Telephone Encounter (Signed)
Spoke with patient to let her know that I would send a message to have her MRI bx cancelled since this is not needed due to they were able to bx under u/s. Message send to Surgeon's office as well that patient is ready for sx.

## 2022-11-19 NOTE — Progress Notes (Signed)
HEMATOLOGY-ONCOLOGY TELEPHONE VISIT PROGRESS NOTE  I connected with our patient on 11/19/22 at  8:45 AM EST by telephone and verified that I am speaking with the correct person using two identifiers.  I discussed the limitations, risks, security and privacy concerns of performing an evaluation and management service by telephone and the availability of in person appointments.  I also discussed with the patient that there may be a patient responsible charge related to this service. The patient expressed understanding and agreed to proceed.   History of Present Illness: Follow-up to discuss results of the recent biopsy on 11/15/2022  Oncology History  Primary malignant neoplasm of upper outer quadrant of breast, left ([place])  10/18/2022 Initial Diagnosis   2000: History of right breast cancer treated with lumpectomy ER/PR positive HER2 negative, adjuvant chemo (4 cycles of TC) 5 years of tamoxifen  10/18/2022: Screening mammogram detected left breast focal asymmetry left upper outer quadrant.  Ultrasound: 4 o'clock position: 1.1 cm solid papillary cancer (like DCIS) ER 100%, PR 15%, additionally 2 adjacent masses measuring 2 cm at 3 o'clock position on biopsy came back grade 1 IDC with micropapillary features, ER 100%, PR 100%, Ki-67 less than 1%, HER2 negative 1+   10/30/2022 Cancer Staging   Staging form: Breast, AJCC 8th Edition - Clinical stage from 10/30/2022: Stage IA (cT1b, cN0, cM0, G1, ER+, PR+, HER2-) - Signed by Hayden Pedro, PA-C on 10/30/2022 Method of lymph node assessment: Clinical Histologic grading system: 3 grade system     REVIEW OF SYSTEMS:   Constitutional: Denies fevers, chills or abnormal weight loss All other systems were reviewed with the patient and are negative. 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  Ultrasound: 4 o'clock position: 1.1 cm solid papillary cancer (like DCIS) ER 100%, PR 15%, additionally 2 adjacent masses measuring 2 cm at 3 o'clock position on biopsy came back grade 1 IDC with micropapillary features, ER 100%, PR 100%, Ki-67 less than 1%, HER2 negative 1+  11/15/2022: Left breast ultrasound-guided biopsy 8:00: Grade 2 IDC  Recommendations: 1.  Double lumpectomy followed by 2. Oncotype DX testing to determine if chemotherapy would be of any benefit followed by 3. Adjuvant radiation therapy followed by 4. Adjuvant antiestrogen therapy  Since ultrasound-guided biopsy was positive we may cancel the MRI guided biopsy. Return to clinic after surgery to discuss the final pathology report.   I discussed the assessment and treatment plan with the patient. The patient was provided an opportunity to ask questions and all were answered. The patient agreed with the plan and demonstrated an understanding of the instructions. The patient was advised to call back or seek an in-person evaluation if the symptoms worsen or if the condition fails to improve as anticipated.   I provided 12 minutes of non-face-to-face time during this encounter.  This includes time for charting and coordination of care   Harriette Ohara, MD

## 2022-11-20 ENCOUNTER — Encounter: Payer: Self-pay | Admitting: Hematology and Oncology

## 2022-11-20 ENCOUNTER — Other Ambulatory Visit: Payer: No Typology Code available for payment source

## 2022-11-25 ENCOUNTER — Telehealth: Payer: Self-pay

## 2022-11-25 ENCOUNTER — Other Ambulatory Visit: Payer: Self-pay | Admitting: General Surgery

## 2022-11-25 DIAGNOSIS — Z170 Estrogen receptor positive status [ER+]: Secondary | ICD-10-CM

## 2022-11-25 NOTE — Telephone Encounter (Signed)
Pt called and states MRI dated 11/06/22 was not covered by insurance d/t dx code N64.1. Advised pt the order shows on our end C50.412, Z17.0. Advised pt we would send an email to [facility] to help facilitate a resolution. She verbalized thanks and understanding.

## 2022-11-26 ENCOUNTER — Telehealth: Payer: Self-pay | Admitting: Hematology and Oncology

## 2022-11-26 ENCOUNTER — Telehealth: Payer: Self-pay

## 2022-11-26 ENCOUNTER — Encounter: Payer: Self-pay | Admitting: *Deleted

## 2022-11-26 NOTE — Telephone Encounter (Signed)
Per 2/27 IB reached out to patient to schedule post op, patient aware of date and time of appointment.

## 2022-11-26 NOTE — Telephone Encounter (Signed)
Called pt to make her aware we are in touch with Creed Copper at [facility] and she is looking into the matter regarding coding for MRI. She verbalized thanks and understanding.

## 2022-11-27 ENCOUNTER — Encounter: Payer: Self-pay | Admitting: Hematology and Oncology

## 2022-12-05 NOTE — Progress Notes (Signed)
Surgical Instructions    Your procedure is scheduled on Thursday, 12/12/22.  Report to [facility] Main Entrance "A" at 12:30 P.M., then check in with the Admitting office.  Call this number if you have problems the morning of surgery:  [phone]   If you have any questions prior to your surgery date call [phone]: Open Monday-Friday 8am-4pm If you experience any cold or flu symptoms such as cough, fever, chills, shortness of breath, etc. between now and your scheduled surgery, please notify us at the above number     Remember:  Do not eat after midnight the night before your surgery  You may drink clear liquids until 11:30am the morning of your surgery.   Clear liquids allowed are: Water, Non-Citrus Juices (without pulp), Carbonated Beverages, Clear Tea, Black Coffee ONLY (NO MILK, CREAM OR POWDERED CREAMER of any kind), and Gatorade    Take these medicines the morning of surgery with A SIP OF WATER:  omeprazole (PRILOSEC)   As of today, STOP taking any Aspirin (unless otherwise instructed by your surgeon) Aleve, Naproxen, Ibuprofen, Motrin, Advil, Goody's, BC's, all herbal medications, fish oil, and all vitamins.           Do not wear jewelry or makeup. Do not wear lotions, powders, perfumes or deodorant. Do not shave 48 hours prior to surgery.   Do not bring valuables to the hospital. Do not wear nail polish, gel polish, artificial nails, or any other type of covering on natural nails (fingers and toes) If you have artificial nails or gel coating that need to be removed by a nail salon, please have this removed prior to surgery. Artificial nails or gel coating may interfere with anesthesia's ability to adequately monitor your vital signs.  [facility] is not responsible for any belongings or valuables.    Do NOT Smoke (Tobacco/Vaping)  24 hours prior to your procedure  If you use a CPAP at night, you may bring your mask for your overnight stay.   Contacts, glasses,  hearing aids, dentures or partials may not be worn into surgery, please bring cases for these belongings   For patients admitted to the hospital, discharge time will be determined by your treatment team.   Patients discharged the day of surgery will not be allowed to drive home, and someone needs to stay with them for 24 hours.   SURGICAL WAITING ROOM VISITATION Patients having surgery or a procedure may have no more than 2 support people in the waiting area - these visitors may rotate.   Children under the age of 33 must have an adult with them who is not the patient. If the patient needs to stay at the hospital during part of their recovery, the visitor guidelines for inpatient rooms apply. Pre-op nurse will coordinate an appropriate time for 1 support person to accompany patient in pre-op.  This support person may not rotate.   Please refer to [email or web address] for the visitor guidelines for Inpatients (after your surgery is over and you are in a regular room).    Special instructions:    Oral Hygiene is also important to reduce your risk of infection.  Remember - BRUSH YOUR TEETH THE MORNING OF SURGERY WITH YOUR REGULAR TOOTHPASTE   [place]- Preparing For Surgery  Before surgery, you can play an important role. Because skin is not sterile, your skin needs to be as free of germs as possible. You can reduce the number of germs on your skin by washing with CHG (  chlorahexidine gluconate) Soap before surgery.  CHG is an antiseptic cleaner which kills germs and bonds with the skin to continue killing germs even after washing.     Please do not use if you have an allergy to CHG or antibacterial soaps. If your skin becomes reddened/irritated stop using the CHG.  Do not shave (including legs and underarms) for at least 48 hours prior to first CHG shower. It is OK to shave your face.  Please follow these instructions carefully.      Shower the NIGHT BEFORE SURGERY and the MORNING OF SURGERY with CHG Soap.   If you chose to wash your hair, wash your hair first as usual with your normal shampoo. After you shampoo, rinse your hair and body thoroughly to remove the shampoo.  Then [employer] and genitals (private parts) with your normal soap and rinse thoroughly to remove soap.  After that Use CHG Soap as you would any other liquid soap. You can apply CHG directly to the skin and wash gently with a scrungie or a clean washcloth.   Apply the CHG Soap to your body ONLY FROM THE NECK DOWN.  Do not use on open wounds or open sores. Avoid contact with your eyes, ears, mouth and genitals (private parts). Wash Face and genitals (private parts)  with your normal soap.   Wash thoroughly, paying special attention to the area where your surgery will be performed.  Thoroughly rinse your body with warm water from the neck down.  DO NOT shower/wash with your normal soap after using and rinsing off the CHG Soap.  Pat yourself dry with a CLEAN TOWEL.  Wear CLEAN PAJAMAS to bed the night before surgery  Place CLEAN SHEETS on your bed the night before your surgery  DO NOT SLEEP WITH PETS.   Day of Surgery: Take a shower with CHG soap. Wear Clean/Comfortable clothing the morning of surgery Do not apply any deodorants/lotions.   Remember to brush your teeth WITH YOUR REGULAR TOOTHPASTE.    If you received a COVID test during your pre-op visit, it is requested that you wear a mask when out in public, stay away from anyone that may not be feeling well, and notify your surgeon if you develop symptoms. If you have been in contact with anyone that has tested positive in the last 10 days, please notify your surgeon.    Please read over the following fact sheets that you were given.

## 2022-12-06 ENCOUNTER — Encounter (HOSPITAL_COMMUNITY)
Admission: RE | Admit: 2022-12-06 | Discharge: 2022-12-06 | Disposition: A | Discharge status: Home / Self Care | Payer: No Typology Code available for payment source | Source: Ambulatory Visit | Attending: General Surgery | Admitting: General Surgery

## 2022-12-06 ENCOUNTER — Encounter (HOSPITAL_COMMUNITY): Payer: Self-pay

## 2022-12-06 ENCOUNTER — Other Ambulatory Visit: Payer: Self-pay

## 2022-12-06 VITALS — BP 151/92 | HR 70 | Temp 97.8°F | Resp 17 | Ht 66.0 in | Wt 181.6 lb

## 2022-12-06 DIAGNOSIS — Z01818 Encounter for other preprocedural examination: Secondary | ICD-10-CM | POA: Insufficient documentation

## 2022-12-06 DIAGNOSIS — I2510 Atherosclerotic heart disease of native coronary artery without angina pectoris: Secondary | ICD-10-CM | POA: Insufficient documentation

## 2022-12-06 DIAGNOSIS — I498 Other specified cardiac arrhythmias: Secondary | ICD-10-CM | POA: Insufficient documentation

## 2022-12-06 HISTORY — DX: Essential (primary) hypertension: I10

## 2022-12-06 LAB — BASIC METABOLIC PANEL
Anion gap: 8 (ref 5–15)
BUN: 13 mg/dL (ref 8–23)
CO2: 26 mmol/L (ref 22–32)
Calcium: 9.4 mg/dL (ref 8.9–10.3)
Chloride: 106 mmol/L (ref 98–111)
Creatinine, Ser: 0.87 mg/dL (ref 0.44–1.00)
GFR, Estimated: >60 mL/min (ref >60)
Glucose, Bld: 146 mg/dL — ABNORMAL HIGH (ref 70–99)
Potassium: 3.4 mmol/L — ABNORMAL LOW (ref 3.5–5.1)
Sodium: 140 mmol/L (ref 135–145)

## 2022-12-06 LAB — CBC
HCT: 39.2 % (ref 36.0–46.0)
Hemoglobin: 13.1 g/dL (ref 12.0–15.0)
MCH: 30.7 pg (ref 26.0–34.0)
MCHC: 33.4 g/dL (ref 30.0–36.0)
MCV: 91.8 fL (ref 80.0–100.0)
Platelets: 286 10*3/uL (ref 150–400)
RBC: 4.27 MIL/uL (ref 3.87–5.11)
RDW: 12.7 % (ref 11.5–15.5)
WBC: 5.6 10*3/uL (ref 4.0–10.5)
nRBC: 0 % (ref 0.0–0.2)

## 2022-12-06 NOTE — Progress Notes (Addendum)
PCP - elaine griffin ([facility]) Cardiologist - denies Oncologist: Gudena  PPM/ICD - denies   Chest x-ray - n/a EKG - 12/06/22 Stress Test - denies ECHO - denies Cardiac Cath - denies  Sleep Study - denies  ERAS Protcol -yes PRE-SURGERY Ensure or G2- none ordered  COVID TEST- not needed   Anesthesia review: yes, abnormal EKG  Patient denies shortness of breath, fever, cough and chest pain at PAT appointment   All instructions explained to the patient, with a verbal understanding of the material. Patient agrees to go over the instructions while at home for a better understanding. Patient also instructed to self quarantine after being tested for COVID-19. The opportunity to ask questions was provided.

## 2022-12-12 ENCOUNTER — Encounter (HOSPITAL_COMMUNITY): Payer: Self-pay | Admitting: General Surgery

## 2022-12-12 ENCOUNTER — Ambulatory Visit (HOSPITAL_COMMUNITY)
Admission: RE | Admit: 2022-12-12 | Discharge: 2022-12-12 | Disposition: A | Discharge status: Home / Self Care | Payer: No Typology Code available for payment source | Attending: General Surgery | Admitting: General Surgery

## 2022-12-12 ENCOUNTER — Encounter (HOSPITAL_COMMUNITY)
Admission: RE | Disposition: A | Discharge status: Home / Self Care | Payer: Self-pay | Source: Home / Self Care | Attending: General Surgery

## 2022-12-12 ENCOUNTER — Ambulatory Visit (HOSPITAL_BASED_OUTPATIENT_CLINIC_OR_DEPARTMENT_OTHER): Payer: No Typology Code available for payment source | Admitting: Anesthesiology

## 2022-12-12 ENCOUNTER — Ambulatory Visit (HOSPITAL_COMMUNITY): Payer: No Typology Code available for payment source | Admitting: Emergency Medicine

## 2022-12-12 ENCOUNTER — Other Ambulatory Visit: Payer: Self-pay

## 2022-12-12 DIAGNOSIS — Z87891 Personal history of nicotine dependence: Secondary | ICD-10-CM | POA: Diagnosis not present

## 2022-12-12 DIAGNOSIS — Z170 Estrogen receptor positive status [ER+]: Secondary | ICD-10-CM

## 2022-12-12 DIAGNOSIS — Z800 Family history of malignant neoplasm of digestive organs: Secondary | ICD-10-CM | POA: Insufficient documentation

## 2022-12-12 DIAGNOSIS — K219 Gastro-esophageal reflux disease without esophagitis: Secondary | ICD-10-CM | POA: Diagnosis not present

## 2022-12-12 DIAGNOSIS — Z808 Family history of malignant neoplasm of other organs or systems: Secondary | ICD-10-CM | POA: Diagnosis not present

## 2022-12-12 DIAGNOSIS — C50812 Malignant neoplasm of overlapping sites of left female breast: Secondary | ICD-10-CM | POA: Insufficient documentation

## 2022-12-12 DIAGNOSIS — C50912 Malignant neoplasm of unspecified site of left female breast: Secondary | ICD-10-CM | POA: Diagnosis not present

## 2022-12-12 DIAGNOSIS — I10 Essential (primary) hypertension: Secondary | ICD-10-CM | POA: Insufficient documentation

## 2022-12-12 HISTORY — PX: MASTECTOMY W/ SENTINEL NODE BIOPSY: SHX2001

## 2022-12-12 HISTORY — PX: BREAST LUMPECTOMY WITH RADIOACTIVE SEED AND SENTINEL LYMPH NODE BIOPSY: SHX6550

## 2022-12-12 SURGERY — BREAST LUMPECTOMY WITH RADIOACTIVE SEED AND SENTINEL LYMPH NODE BIOPSY
Anesthesia: General | Site: Breast | Laterality: Left

## 2022-12-12 MED ORDER — ONDANSETRON HCL 4 MG/2ML IJ SOLN
INTRAMUSCULAR | Status: DC | PRN
  Administered 2022-12-12: 4 mg via INTRAVENOUS

## 2022-12-12 MED ORDER — AMISULPRIDE (ANTIEMETIC) 5 MG/2ML IV SOLN: 10.0000 mg | Freq: Once | INTRAVENOUS | Status: DC | PRN

## 2022-12-12 MED ORDER — CHLORHEXIDINE GLUCONATE CLOTH 2 % EX PADS: 6.0000 | MEDICATED_PAD | Freq: Once | CUTANEOUS | Status: DC

## 2022-12-12 MED ORDER — ORAL CARE MOUTH RINSE: 15.0000 mL | Freq: Once | OROMUCOSAL | Status: AC

## 2022-12-12 MED ORDER — ACETAMINOPHEN 500 MG PO TABS
1000.0000 mg | ORAL_TABLET | ORAL | Status: AC
  Administered 2022-12-12: 1000 mg via ORAL
  Filled 2022-12-12: qty 2

## 2022-12-12 MED ORDER — DEXAMETHASONE SODIUM PHOSPHATE 10 MG/ML IJ SOLN
INTRAMUSCULAR | Status: DC | PRN
  Administered 2022-12-12: 10 mg via INTRAVENOUS

## 2022-12-12 MED ORDER — PHENYLEPHRINE HCL (PRESSORS) 10 MG/ML IV SOLN
INTRAVENOUS | Status: DC | PRN
  Administered 2022-12-12 (×3): 80 ug via INTRAVENOUS
  Administered 2022-12-12: 160 ug via INTRAVENOUS
  Administered 2022-12-12 (×7): 80 ug via INTRAVENOUS

## 2022-12-12 MED ORDER — BUPIVACAINE LIPOSOME 1.3 % IJ SUSP
INTRAMUSCULAR | Status: DC | PRN
  Administered 2022-12-12: 10 mL

## 2022-12-12 MED ORDER — ONDANSETRON HCL 4 MG/2ML IJ SOLN
INTRAMUSCULAR | Status: AC
  Filled 2022-12-12: qty 2

## 2022-12-12 MED ORDER — BUPIVACAINE HCL (PF) 0.5 % IJ SOLN
INTRAMUSCULAR | Status: DC | PRN
  Administered 2022-12-12: 15 mL

## 2022-12-12 MED ORDER — CEFAZOLIN SODIUM-DEXTROSE 2-4 GM/100ML-% IV SOLN
2.0000 g | INTRAVENOUS | Status: DC
  Filled 2022-12-12: qty 100

## 2022-12-12 MED ORDER — MIDAZOLAM HCL 2 MG/2ML IJ SOLN: 1.0000 mg | Freq: Once | INTRAMUSCULAR | Status: AC

## 2022-12-12 MED ORDER — OXYCODONE HCL 5 MG/5ML PO SOLN: 5.0000 mg | Freq: Once | ORAL | Status: DC | PRN

## 2022-12-12 MED ORDER — PROPOFOL 10 MG/ML IV BOLUS
INTRAVENOUS | Status: AC
  Filled 2022-12-12: qty 20

## 2022-12-12 MED ORDER — CHLORHEXIDINE GLUCONATE 0.12 % MT SOLN
15.0000 mL | Freq: Once | OROMUCOSAL | Status: AC
  Administered 2022-12-12: 15 mL via OROMUCOSAL
  Filled 2022-12-12: qty 15

## 2022-12-12 MED ORDER — LIDOCAINE 2% (20 MG/ML) 5 ML SYRINGE
INTRAMUSCULAR | Status: DC | PRN
  Administered 2022-12-12: 100 mg via INTRAVENOUS

## 2022-12-12 MED ORDER — EPHEDRINE 5 MG/ML INJ
INTRAVENOUS | Status: AC
  Filled 2022-12-12: qty 5

## 2022-12-12 MED ORDER — HYDROCODONE-ACETAMINOPHEN 5-325 MG PO TABS: 1.0000 | ORAL_TABLET | Freq: Four times a day (QID) | ORAL | 0 refills | Status: DC | PRN

## 2022-12-12 MED ORDER — PHENYLEPHRINE 80 MCG/ML (10ML) SYRINGE FOR IV PUSH (FOR BLOOD PRESSURE SUPPORT)
PREFILLED_SYRINGE | INTRAVENOUS | Status: AC
  Filled 2022-12-12: qty 10

## 2022-12-12 MED ORDER — FENTANYL CITRATE (PF) 250 MCG/5ML IJ SOLN
INTRAMUSCULAR | Status: AC
  Filled 2022-12-12: qty 5

## 2022-12-12 MED ORDER — EPHEDRINE SULFATE-NACL 50-0.9 MG/10ML-% IV SOSY
PREFILLED_SYRINGE | INTRAVENOUS | Status: DC | PRN
  Administered 2022-12-12: 2.5 mg via INTRAVENOUS

## 2022-12-12 MED ORDER — DEXAMETHASONE SODIUM PHOSPHATE 10 MG/ML IJ SOLN
INTRAMUSCULAR | Status: AC
  Filled 2022-12-12: qty 1

## 2022-12-12 MED ORDER — FENTANYL CITRATE (PF) 100 MCG/2ML IJ SOLN: 25.0000 ug | INTRAMUSCULAR | Status: DC | PRN

## 2022-12-12 MED ORDER — MIDAZOLAM HCL 2 MG/2ML IJ SOLN
INTRAMUSCULAR | Status: AC
  Administered 2022-12-12: 1 mg via INTRAVENOUS
  Filled 2022-12-12: qty 2

## 2022-12-12 MED ORDER — FENTANYL CITRATE (PF) 100 MCG/2ML IJ SOLN: 50.0000 ug | Freq: Once | INTRAMUSCULAR | Status: AC

## 2022-12-12 MED ORDER — 0.9 % SODIUM CHLORIDE (POUR BTL) OPTIME
TOPICAL | Status: DC | PRN
  Administered 2022-12-12: 1000 mL

## 2022-12-12 MED ORDER — FENTANYL CITRATE (PF) 100 MCG/2ML IJ SOLN
INTRAMUSCULAR | Status: AC
  Administered 2022-12-12: 50 ug
  Filled 2022-12-12: qty 2

## 2022-12-12 MED ORDER — MAGTRACE LYMPHATIC TRACER
INTRAMUSCULAR | Status: DC | PRN
  Administered 2022-12-12: 2 mL via INTRAMUSCULAR

## 2022-12-12 MED ORDER — LACTATED RINGERS IV SOLN: INTRAVENOUS | Status: DC

## 2022-12-12 MED ORDER — GLYCOPYRROLATE PF 0.2 MG/ML IJ SOSY
PREFILLED_SYRINGE | INTRAMUSCULAR | Status: DC | PRN
  Administered 2022-12-12: .2 mg via INTRAVENOUS

## 2022-12-12 MED ORDER — PROPOFOL 10 MG/ML IV BOLUS
INTRAVENOUS | Status: DC | PRN
  Administered 2022-12-12: 30 mg via INTRAVENOUS
  Administered 2022-12-12: 200 mg via INTRAVENOUS
  Administered 2022-12-12: 250 ug/kg/min via INTRAVENOUS

## 2022-12-12 MED ORDER — BACITRACIN ZINC 500 UNIT/GM EX OINT
TOPICAL_OINTMENT | CUTANEOUS | Status: AC
  Filled 2022-12-12: qty 28.35

## 2022-12-12 MED ORDER — OXYCODONE HCL 5 MG PO TABS: 5.0000 mg | ORAL_TABLET | Freq: Once | ORAL | Status: DC | PRN

## 2022-12-12 MED ORDER — LIDOCAINE HCL 1 % IJ SOLN
INTRAMUSCULAR | Status: DC | PRN
  Administered 2022-12-12: 20 mL

## 2022-12-12 MED ORDER — FENTANYL CITRATE (PF) 250 MCG/5ML IJ SOLN
INTRAMUSCULAR | Status: DC | PRN
  Administered 2022-12-12: 50 ug via INTRAVENOUS
  Administered 2022-12-12 (×2): 25 ug via INTRAVENOUS
  Administered 2022-12-12: 50 ug via INTRAVENOUS

## 2022-12-12 SURGICAL SUPPLY — 51 items
ADH SKN CLS APL DERMABOND .7 (GAUZE/BANDAGES/DRESSINGS) ×4
APL PRP STRL LF DISP 70% ISPRP (MISCELLANEOUS) ×2
BAG COUNTER SPONGE SURGICOUNT (BAG) ×2 IMPLANT
BAG SPNG CNTER NS LX DISP (BAG) ×2
BINDER BREAST LRG (GAUZE/BANDAGES/DRESSINGS) IMPLANT
BINDER BREAST XLRG (GAUZE/BANDAGES/DRESSINGS) IMPLANT
BNDG CMPR 5X4 CHSV STRCH STRL (GAUZE/BANDAGES/DRESSINGS) ×2
BNDG COHESIVE 4X5 TAN STRL (GAUZE/BANDAGES/DRESSINGS) ×2 IMPLANT
BNDG COHESIVE 4X5 TAN STRL LF (GAUZE/BANDAGES/DRESSINGS) IMPLANT
CANISTER SUCT 3000ML PPV (MISCELLANEOUS) ×2 IMPLANT
CHLORAPREP W/TINT 26 (MISCELLANEOUS) ×2 IMPLANT
CLIP TI LARGE 6 (CLIP) ×2 IMPLANT
CLIP TI MEDIUM 24 (CLIP) ×2 IMPLANT
CNTNR URN SCR LID CUP LEK RST (MISCELLANEOUS) IMPLANT
CONT SPEC 4OZ STRL OR WHT (MISCELLANEOUS)
COVER PROBE W GEL 5X96 (DRAPES) ×4 IMPLANT
COVER SURGICAL LIGHT HANDLE (MISCELLANEOUS) ×2 IMPLANT
DERMABOND ADVANCED .7 DNX12 (GAUZE/BANDAGES/DRESSINGS) ×2 IMPLANT
DEVICE DUBIN SPECIMEN MAMMOGRA (MISCELLANEOUS) IMPLANT
DRAPE CHEST BREAST 15X10 FENES (DRAPES) ×2 IMPLANT
DRAPE SURG 17X23 STRL (DRAPES) IMPLANT
ELECT COATED BLADE 2.86 ST (ELECTRODE) ×2 IMPLANT
ELECT REM PT RETURN 9FT ADLT (ELECTROSURGICAL) ×2
ELECTRODE REM PT RTRN 9FT ADLT (ELECTROSURGICAL) ×2 IMPLANT
GLOVE BIO SURGEON STRL SZ 6 (GLOVE) ×2 IMPLANT
GLOVE INDICATOR 6.5 STRL GRN (GLOVE) ×2 IMPLANT
GOWN STRL REUS W/ TWL LRG LVL3 (GOWN DISPOSABLE) ×2 IMPLANT
GOWN STRL REUS W/ TWL XL LVL3 (GOWN DISPOSABLE) ×2 IMPLANT
GOWN STRL REUS W/TWL LRG LVL3 (GOWN DISPOSABLE) ×2
GOWN STRL REUS W/TWL XL LVL3 (GOWN DISPOSABLE) ×2
KIT BASIN OR (CUSTOM PROCEDURE TRAY) ×2 IMPLANT
KIT MARKER MARGIN INK (KITS) ×2 IMPLANT
LIGHT WAVEGUIDE WIDE FLAT (MISCELLANEOUS) IMPLANT
NDL 18GX1X1/2 (RX/OR ONLY) (NEEDLE) IMPLANT
NDL FILTER BLUNT 18X1 1/2 (NEEDLE) IMPLANT
NDL HYPO 25GX1X1/2 BEV (NEEDLE) ×2 IMPLANT
NEEDLE 18GX1X1/2 (RX/OR ONLY) (NEEDLE) IMPLANT
NEEDLE FILTER BLUNT 18X1 1/2 (NEEDLE) IMPLANT
NEEDLE HYPO 25GX1X1/2 BEV (NEEDLE) ×2 IMPLANT
NS IRRIG 1000ML POUR BTL (IV SOLUTION) ×2 IMPLANT
PACK GENERAL/GYN (CUSTOM PROCEDURE TRAY) ×2 IMPLANT
PACK UNIVERSAL I (CUSTOM PROCEDURE TRAY) ×2 IMPLANT
SPONGE T-LAP 18X18 ~~LOC~~+RFID (SPONGE) IMPLANT
STOCKINETTE IMPERVIOUS 9X36 MD (GAUZE/BANDAGES/DRESSINGS) ×2 IMPLANT
STRIP CLOSURE SKIN 1/2X4 (GAUZE/BANDAGES/DRESSINGS) ×2 IMPLANT
SUT MNCRL AB 4-0 PS2 18 (SUTURE) ×2 IMPLANT
SUT VIC AB 3-0 SH 8-18 (SUTURE) ×2 IMPLANT
SYR CONTROL 10ML LL (SYRINGE) ×2 IMPLANT
TOWEL GREEN STERILE (TOWEL DISPOSABLE) ×2 IMPLANT
TOWEL GREEN STERILE FF (TOWEL DISPOSABLE) ×2 IMPLANT
TRACER MAGTRACE VIAL (MISCELLANEOUS) IMPLANT

## 2022-12-12 NOTE — H&P (Signed)
PROVIDER: Georgianne Fick, MD Patient Care Team: Maryla Morrow, MD as PCP - General (Family Medicine) Georgianne Fick, MD as Consulting Provider (Surgical Oncology) Rulon Eisenmenger, MD (Hematology and Oncology) Marye Round, MD (Radiation Oncology) Doristine Bosworth, Rosann Auerbach, MD (Family Medicine) [facility] [place], Leonia Reader, MD (Dermatology)  MRN: [id number] DOB: [birth year] DATE OF ENCOUNTER: 11/25/2022  Chief Complaint: Follow-up ( RE-CHECK - *need vitals* discuss breast sx/)   History of Present Illness: [name] is a 62 y.o. female who is seen today for breast cancer.  Initial history:  [name] is a 62 y.o. female who is seen today as an office consultation at the request of Dr. Lisbeth Renshaw for evaluation of Breast Cancer  Pt presents with a new diagnosis of left breast cancer January 2024. She has a history of right breast cancer with breast conservation. She presented with screening detected left breast asymmetry. Diagnostic imaging was performed. This showed a 1.1 cm mass at 3 o'clock 4 cm from the nipple and 2 adjacent masses at 4 o'clock 9 cm from the nipple measuring 2 cm together. She had biopsies of these areas. 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  Her previous cancer was treated with lumpectomy, SLN, 4 cycles of chemo, radiation, and tamoxifen for 5 years. She was treated by Drs. Streck, [place], and [place]. She also had reduction mammoplasty and fat grafting by Dr. Baldwin [place].  Her mother had colon cancer in her 24s and half sister on her mother's side had melanoma.  Interval history:  Pt underwent MRI and had a concerning spot at 7 o'clock on the left breast. Second look ultrasound found this spot and biopsy was positive for grade 2 invasive ductal carcinoma +/+/-, Ki 67 20%.  She has a skin ulcer that has developed from  one of the needle biopsy sites.  MR 11/06/2022  IMPRESSION: 1. Suspicious 1 cm mass within the [facility] LEFT breast, middle to posterior depth. 2nd-look ultrasound/possible biopsy is recommended for further evaluation. If this area is not identified sonographically, than MR guided biopsy is recommended. 2. Biopsy clip artifacts within the LEFT breast, at the sites of biopsy-proven malignancy. No significant residual suspicious enhancement in these areas noted. 3. No abnormal appearing lymph nodes. 4. Fat necrosis changes within both breasts and RIGHT lumpectomy changes.  RECOMMENDATION: 2nd-look LEFT breast ultrasound/possible biopsy to evaluate the 1 cm INNER-LOWER INNER LEFT breast mass. If this is not identified sonographically, than MR guided biopsy is recommended.  BI-RADS CATEGORY 4: Suspicious.    Review of Systems: A complete review of systems was obtained from the patient. I have reviewed this information and discussed as appropriate with the patient. See HPI as well for other ROS.  ROS  Medical History: Past Medical History: Diagnosis Date GERD (gastroesophageal reflux disease) Hyperlipidemia Hypertension  Patient Active Problem List Diagnosis Primary malignant neoplasm of upper outer quadrant of breast, left (CMS-HCC) Personal history of malignant neoplasm of breast Family history of colon cancer Family history of melanoma Cancer of overlapping sites of left breast (CMS-HCC)  Past Surgical History: Procedure Laterality Date RIGHT BREAST RECONSTRUCTION (Right: Breast) LIPOSUCTION AND FAT GRAFTING (Bilateral: Abdomen) 07/23/2018 Dr. Nathanial Rancher ABDOMEN, HIPS AND FLANKS LIPOSUCTION WITH MICRO FAT GRAFTING TO RIGHT BREAST 09/28/2018 Dr. Britt Bottom LIPOSUCTION OF ABDOMEN HIPS AND FLANKS WITH FAT GRAFTING TO RIGHT BREAT 03/02/2019 Dr. Britt Bottom LEFT BREAST MASTOPEXY 07/13/2019  Dr. Britt Bottom BREAST RECONSTRUCTION (Bilateral: Breast)  LIPOSUCTION WITH LIPOFILLING 01/31/2020 Dr. Britt Bottom MICRO FAT GRAFTING USING LIPOSUCTION FROM ABDOMEN AND BILATERAL FLANKS 07/18/2020 Dr. Britt Bottom BREAST RECONSTRUCTION WITH FAT TRANSFER (Left) LIPOSUCTION WITH LIPOFILLING 06/26/2021 Dr. Britt Bottom   Allergies Allergen Reactions Latex Rash  Current Outpatient Medications on File Prior to Visit Medication Sig Dispense Refill ascorbic acid, vitamin C, (VITAMIN C) 500 MG tablet Take by mouth hydroCHLOROthiazide (MICROZIDE) 12.5 mg capsule multivitamin with minerals tablet Take 1 tablet by mouth once daily omeprazole (PRILOSEC) 20 MG DR capsule Take 20 mg by mouth once daily psyllium husk/aspartame (METAMUCIL FIBER SINGLES ORAL) Take by mouth  No current facility-administered medications on file prior to visit.  Family History Problem Relation Age of Onset Colon cancer Mother Skin cancer Sister Obesity Sister High blood pressure (Hypertension) Sister Hyperlipidemia (Elevated cholesterol) Sister Diabetes Sister   Social History  Tobacco Use Smoking Status Former Types: Cigarettes Quit date: 1992 Years since quitting: 32.1 Smokeless Tobacco Never   Social History  Socioeconomic History Marital status: Single Tobacco Use Smoking status: Former Types: Cigarettes Quit date: 1992 Years since quitting: 32.1 Smokeless tobacco: Never Substance and Sexual Activity Alcohol use: Yes Alcohol/week: 4.0 standard drinks of alcohol Types: 4 Standard drinks or equivalent per week Drug use: Never  Objective:  Vitals: 11/25/22 1511 BP: (!) 146/92 Pulse: 75 Temp: 36.1 C (97 F) SpO2: 99% Weight: 81.6 kg (180 lb) Height: 167.6 cm ('5\' 6"'$ )  Body mass index is 29.05 kg/m.  Head: Normocephalic and atraumatic. Eyes: Conjunctivae are normal. Pupils are equal, round, and reactive to light. No scleral icterus. Neck: Normal range of motion. Neck supple. No tracheal deviation present. No thyromegaly present. Resp:  No respiratory distress, normal effort. Breast: breasts with reduction pattern incisions. No appreciable hematoma. 1 cm ulcer lower outer quadrant on left. No palpable masses. Abd: Abdomen is soft, non distended and non tender. No masses are palpable. There is no rebound and no guarding. Neurological: Alert and oriented to person, place, and time. Coordination normal. Skin: Skin is warm and dry. No rash noted. No diaphoretic. No erythema. No pallor. Psychiatric: Normal mood and affect. Normal behavior. Judgment and thought content normal.  Labs, Imaging and Diagnostic Testing:  None new    Assessment and Plan:  Diagnoses and all orders for this visit:  Cancer of overlapping sites of left breast (CMS-HCC)  Personal history of malignant neoplasm of breast  Family history of colon cancer  Family history of melanoma  Pt had negative genetics in 2022. Will plan 2 lumpectomies with SLN bx.  The surgical procedure was described to the patient. I discussed the incision type and location and that we will need radiology involved with seed markers.  We discussed the risks bleeding, infection, damage to other structures, need for further procedures/surgeries. We discussed the risk of seroma. The patient was advised if the breast has cancer, we may need to go back to surgery for additional tissue to obtain negative margins or for a lymph node biopsy. The patient was advised that these are the most common complications, but that others can occur as well. I discussed the risk of alteration in breast contour or size. I discussed risk of chronic pain. There are rare instances of heart/lung issues post op as well as blood clots.  They were advised against taking aspirin or other anti-inflammatory agents/blood thinners the week before surgery.  The risks and benefits of the procedure were described to the patient and she wishes to proceed.  No follow-ups  on file.  Georgianne Fick, MD

## 2022-12-12 NOTE — Anesthesia Procedure Notes (Signed)
Anesthesia Regional Block: Pectoralis block   Pre-Anesthetic Checklist: , timeout performed,  Correct Patient, Correct Site, Correct Laterality,  Correct Procedure, Correct Position, site marked,  Risks and benefits discussed,  Surgical consent,  Pre-op evaluation,  At surgeon's request and post-op pain management  Laterality: Left  Prep: chloraprep       Needles:  Injection technique: Single-shot  Needle Type: Echogenic Needle     Needle Length: 10cm  Needle Gauge: 21     Additional Needles:   Narrative:  Start time: 12/12/2022 2:42 PM End time: 12/12/2022 2:46 PM Injection made incrementally with aspirations every 5 mL.  Performed by: Personally  Anesthesiologist: Audry Pili, MD  Additional Notes: No pain on injection. No increased resistance to injection. Injection made in 5cc increments. Good needle visualization. Patient tolerated the procedure well.

## 2022-12-12 NOTE — Discharge Instructions (Addendum)
[facility] Office Phone Number [phone]  BREAST BIOPSY/ PARTIAL MASTECTOMY: POST OP INSTRUCTIONS  Always review your discharge instruction sheet given to you by the facility where your surgery was performed.  IF YOU HAVE DISABILITY OR FAMILY LEAVE FORMS, YOU MUST BRING THEM TO THE OFFICE FOR PROCESSING.  DO NOT GIVE THEM TO YOUR DOCTOR.  Take 2 tylenol (acetominophen) three times a day for 3 days.  If you still have pain, add ibuprofen with food in between if able to take this (if you have kidney issues or stomach issues, do not take ibuprofen).  If both of those are not enough, add the narcotic pain pill.  If you find you are needing a lot of this overnight after surgery, call the next morning for a refill.    Prescriptions will not be filled after 5pm or on week-ends. Take your usually prescribed medications unless otherwise directed You should eat very light the first 24 hours after surgery, such as soup, crackers, pudding, etc.  Resume your normal diet the day after surgery. Most patients will experience some swelling and bruising in the breast.  Ice packs and a good support bra will help.  Swelling and bruising can take several days to resolve.  It is common to experience some constipation if taking pain medication after surgery.  Increasing fluid intake and taking a stool softener will usually help or prevent this problem from occurring.  A mild laxative (Milk of Magnesia or Miralax) should be taken according to package directions if there are no bowel movements after 48 hours. Unless discharge instructions indicate otherwise, you may remove your bandages 48 hours after surgery, and you may shower at that time.  You may have steri-strips (small skin tapes) in place directly over the incision.  These strips should be left on the skin at least for for 7-10 days.    ACTIVITIES:  You may resume regular daily activities (gradually increasing) beginning the next day.  Wearing a  good support bra or sports bra (or the breast binder) minimizes pain and swelling.  You may have sexual intercourse when it is comfortable. No heavy lifting for 1-2 weeks (not over around 10 pounds).  You may drive when you no longer are taking prescription pain medication, you can comfortably wear a seatbelt, and you can safely maneuver your car and apply brakes. RETURN TO WORK:  __________3-14 days depending on job. _______________ You should see your doctor in the office for a follow-up appointment approximately two weeks after your surgery.  Your doctor's nurse will typically make your follow-up appointment when she calls you with your pathology report.  Expect your pathology report 3-4 business days after your surgery.  You may call to check if you do not hear from us after three days.   WHEN TO CALL YOUR DOCTOR: Fever over 101.0 Nausea and/or vomiting. Extreme swelling or bruising. Continued bleeding from incision. Increased pain, redness, or drainage from the incision.  The clinic staff is available to answer your questions during regular business hours.  Please don't hesitate to call and ask to speak to one of the nurses for clinical concerns.  If you have a medical emergency, go to the nearest emergency room or call 911.  A surgeon from [facility] is always on call at the hospital.  For further questions, please visit [email or web address]   

## 2022-12-12 NOTE — Transfer of Care (Signed)
Immediate Anesthesia Transfer of Care Note  Patient: [name]  Procedure(s) Performed: LEFT BREAST LUMPECTOMY WITH RADIOACTIVE SEED X3 (Left: Breast) SENTINEL LYMPH NODE BIOPSY (Left: Axilla)  Patient Location: PACU  Anesthesia Type:General  Level of Consciousness: awake, alert , and oriented  Airway & Oxygen Therapy: Patient connected to face mask oxygen  Post-op Assessment: Report given to RN and Post -op Vital signs reviewed and stable  Post vital signs: Reviewed and stable  Last Vitals:  Vitals Value Taken Time  BP    Temp    Pulse 70 12/12/22 1827  Resp    SpO2 99 % 12/12/22 1827  Vitals shown include unvalidated device data.  Last Pain:  Vitals:   12/12/22 1445  TempSrc:   PainSc: 0-No pain      Patients Stated Pain Goal: 0 ([id number])  Complications: There were no known notable events for this encounter.

## 2022-12-12 NOTE — Anesthesia Procedure Notes (Signed)
Procedure Name: LMA Insertion Date/Time: 12/12/2022 3:35 PM  Performed by: Ester Rink, CRNAPre-anesthesia Checklist: Patient identified, Emergency Drugs available, Suction available and Patient being monitored Patient Re-evaluated:Patient Re-evaluated prior to induction Oxygen Delivery Method: Circle system utilized Preoxygenation: Pre-oxygenation with 100% oxygen Induction Type: IV induction Ventilation: Mask ventilation without difficulty LMA: LMA inserted LMA Size: 4.0 Tube type: Oral Number of attempts: 1 Placement Confirmation: positive ETCO2 and breath sounds checked- equal and bilateral Tube secured with: Tape Dental Injury: Teeth and Oropharynx as per pre-operative assessment

## 2022-12-12 NOTE — Op Note (Signed)
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   Pre-operative Diagnosis: left breast cancer  Post-operative Diagnosis: Same  Surgeon: [name]   Assistant: Ewell Poe, RNFA  Anesthesia: General endotracheal anesthesia  ASA Class: 3  Procedure Details  The patient was seen in the Holding Room. The risks, benefits, complications, treatment options, and expected outcomes were discussed with the patient. The possibilities of bleeding, infection, the need for additional procedures, failure to diagnose a condition, and creating a complication requiring transfusion or operation were discussed with the patient. The patient concurred with the proposed plan, giving informed consent.  The site of surgery properly noted/marked. The patient was taken to Operating Room # 1, identified, and the procedure verified as Left Breast Seed localized Lumpectomy x 2 with sentinel lymph node biopsy. The left arm, breast, and chest were prepped and draped in standard fashion. A Time Out was held and the above information confirmed. The MagTrace was injected into the subareolar position.  The lumpectomies were performed by creating a vertical incision below the nipple in her prior reduction incision.  The 8 o'clock lesion was addressed first.  Dissection was carried down to around the point of maximum signal intensity. The cautery was used to perform the dissection.  Hemostasis was achieved with cautery.    The specimen was inked with the margin marker paint kit.    Specimen radiography confirmed inclusion of the mammographic lesion, the clip, and the seed.    The 4 o'clock lesion was then addressed similarly.  We were close to the far lateral 3 o'clock lesion and so did a similar lumpectomy as well via the same  incision.  The clip/seed were present in both specimens.    The background signal in the breast was zero.   The wound was irrigated and reinspected for hemostasis.  Clips were placed in all three cavities.   The skin was then closed with 3-0 vicryl in layers and 4-0 monocryl subcuticular suture.    Using a Sentimag probe, left axillary sentinel nodes were identified transcutaneously.  An oblique incision was created below the axillary hairline.  Dissection was carried through the clavipectoral fascia.  Two deep level two axillary sentinel nodes were removed.  Counts per second were >9999 and 2400.    The background count was 240 cps.  The wound was irrigated.  Hemostasis was achieved with cautery.  The axillary incision was closed with a 3-0 vicryl deep dermal interrupted sutures and a 4-0 monocryl subcuticular closure.    Sterile dressings were applied. At the end of the operation, all sponge, instrument, and needle counts were correct.  Findings: grossly clear surgical margins and no adenopathy 8 o 'clock, anterior margin skin, posterior margin pectoralis.  4 o'clock, anterior margin skin 3 o'clock, anterior margin skin, posterior margin pectoralis.    Estimated Blood Loss:  min         Specimens:  Left breast lumpectomy 8 o'clock breast tissue with seed Left breast lumpectomy 4 o'clock breast tissue with seed Left axillary SLN #1 Left axillary SLN #2 Left breast lumpectomy with seed 3 o'clock.             Complications:  None; patient tolerated the procedure well.         Disposition: PACU - hemodynamically stable.         Condition: stable

## 2022-12-12 NOTE — Anesthesia Postprocedure Evaluation (Signed)
Anesthesia Post Note  Patient: [name]  Procedure(s) Performed: LEFT BREAST LUMPECTOMY WITH RADIOACTIVE SEED X3 (Left: Breast) SENTINEL LYMPH NODE BIOPSY (Left: Axilla)     Patient location during evaluation: PACU Anesthesia Type: General Level of consciousness: sedated Pain management: pain level controlled Vital Signs Assessment: post-procedure vital signs reviewed and stable Respiratory status: spontaneous breathing and respiratory function stable Cardiovascular status: stable Postop Assessment: no apparent nausea or vomiting Anesthetic complications: no  There were no known notable events for this encounter.  Last Vitals:  Vitals:   12/12/22 1845 12/12/22 1900  BP: 120/79 128/76  Pulse: 70 71  Resp: 13 13  Temp:    SpO2: 92% 93%    Last Pain:  Vitals:   12/12/22 1900  TempSrc:   PainSc: 0-No pain                 Kamesha Herne DANIEL

## 2022-12-12 NOTE — Anesthesia Preprocedure Evaluation (Addendum)
Anesthesia Evaluation  Patient identified by MRN, date of birth, ID band Patient awake    Reviewed: Allergy & Precautions, NPO status , Patient's Chart, lab work & pertinent test results  History of Anesthesia Complications (+) PONV and history of anesthetic complications  Airway Mallampati: II  TM Distance: >3 FB Neck ROM: Full    Dental  (+) Dental Advisory Given, Teeth Intact   Pulmonary former smoker   Pulmonary exam normal        Cardiovascular hypertension, Pt. on medications Normal cardiovascular exam     Neuro/Psych negative neurological ROS  negative psych ROS   GI/Hepatic Neg liver ROS,GERD  Medicated and Controlled,,  Endo/Other  negative endocrine ROS    Renal/GU negative Renal ROS     Musculoskeletal negative musculoskeletal ROS (+)    Abdominal   Peds  Hematology negative hematology ROS (+)   Anesthesia Other Findings   Reproductive/Obstetrics  Breast cancer                              Anesthesia Physical Anesthesia Plan  ASA: 3  Anesthesia Plan: General   Post-op Pain Management: Tylenol PO (pre-op)* and Regional block*   Induction: Intravenous  PONV Risk Score and Plan: 4 or greater and Treatment may vary due to age or medical condition, Ondansetron, TIVA and Midazolam  Airway Management Planned: LMA  Additional Equipment: None  Intra-op Plan:   Post-operative Plan: Extubation in OR  Informed Consent: I have reviewed the patients History and Physical, chart, labs and discussed the procedure including the risks, benefits and alternatives for the proposed anesthesia with the patient or authorized representative who has indicated his/her understanding and acceptance.     Dental advisory given  Plan Discussed with: CRNA and Anesthesiologist  Anesthesia Plan Comments:         Anesthesia Quick Evaluation

## 2022-12-12 NOTE — Interval H&P Note (Signed)
History and Physical Interval Note:  12/12/2022 2:06 PM  [name]  has presented today for surgery, with the diagnosis of LEFT BREAST CANCER.  The various methods of treatment have been discussed with the patient and family. After consideration of risks, benefits and other options for treatment, the patient has consented to  Procedure(s): LEFT BREAST LUMPECTOMY WITH RADIOACTIVE SEED X2 AND SENTINEL LYMPH NODE BIOPSY (Left) as a surgical intervention.  The patient's history has been reviewed, patient examined, no change in status, stable for surgery.  I have reviewed the patient's chart and labs.  Questions were answered to the patient's satisfaction.     [name]

## 2022-12-13 ENCOUNTER — Telehealth: Payer: Self-pay | Admitting: Hematology and Oncology

## 2022-12-13 ENCOUNTER — Encounter (HOSPITAL_COMMUNITY): Payer: Self-pay | Admitting: General Surgery

## 2022-12-13 ENCOUNTER — Other Ambulatory Visit: Payer: Self-pay | Admitting: General Surgery

## 2022-12-13 DIAGNOSIS — Z853 Personal history of malignant neoplasm of breast: Secondary | ICD-10-CM

## 2022-12-13 DIAGNOSIS — Z170 Estrogen receptor positive status [ER+]: Secondary | ICD-10-CM

## 2022-12-13 NOTE — Telephone Encounter (Signed)
Rescheduled appointment per room resource. Left voicemail.  

## 2022-12-17 LAB — SURGICAL PATHOLOGY

## 2022-12-19 ENCOUNTER — Encounter: Payer: Self-pay | Admitting: *Deleted

## 2022-12-19 NOTE — Progress Notes (Signed)
Patient Care Team: [name], MD as PCP - General (Family Medicine) [name], MD as Consulting Physician (General Surgery) [name], MD as Consulting Physician (Hematology and Oncology) [name], MD as Consulting Physician (Radiation Oncology) [name], RN as Oncology Nurse Navigator [name], RN as Oncology Nurse Navigator  DIAGNOSIS:  Encounter Diagnosis  Name Primary?   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  Ultrasound: 4 o'clock position: 1.1 cm solid papillary cancer (like DCIS) ER 100%, PR 15%, additionally 2 adjacent masses measuring 2 cm at 3 o'clock position on biopsy came back grade 1 IDC with micropapillary features, ER 100%, PR 100%, Ki-67 less than 1%, HER2 negative 1+   10/30/2022 Cancer Staging   Staging form: Breast, AJCC 8th Edition - Clinical stage from 10/30/2022: Stage IA (cT1b, cN0, cM0, G1, ER+, PR+, HER2-) - Signed by Hayden Pedro, PA-C on 10/30/2022 Method of lymph node assessment: Clinical Histologic grading system: 3 grade system   12/12/2022 Surgery   Left lumpectomy: 8:00: Fibrocystic change, 4:00: Grade 1 papillary carcinoma 1.5 cm, margins negative, ER 100%, PR 15%, 0/2 sentinel nodes negative, left lumpectomy 3:00: 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, margins negative, ER 100%, PR 100%, HER2 negative 1+, Ki-67 1%     CHIEF COMPLIANT: Follow-up after surgery  INTERVAL HISTORY: [name] is a 62 y.o. female is here because of recent diagnosis of left breast cancer.     ALLERGIES:  is allergic to latex and tape.  MEDICATIONS:  Current Outpatient Medications  Medication Sig Dispense Refill   acetaminophen (TYLENOL) 500 MG tablet Take 1,000 mg by mouth every 6 (six) hours as needed for moderate pain.     calcium elemental as carbonate (TUMS ULTRA 1000) 400 MG chewable tablet Chew 1,000-2,000 mg by mouth daily as needed for heartburn.     diclofenac Sodium (VOLTAREN) 1 % GEL Apply 1 Application topically 4 (four) times daily as needed (pain).     diphenhydrAMINE (BENADRYL) 25 MG tablet Take 25 mg by mouth at bedtime.     ezetimibe (ZETIA) 10 MG tablet Take 10 mg by mouth daily.     FIBER PO Take 2 capsules by mouth daily.     fluticasone (FLONASE) 50 MCG/ACT nasal spray Place 1 spray into both nostrils daily as needed for allergies or rhinitis.     hydrochlorothiazide (MICROZIDE) 12.5 MG capsule Take 12.5 mg by mouth daily.     HYDROcodone-acetaminophen (NORCO/VICODIN) 5-325 MG tablet Take 1 tablet by mouth every 6 (six) hours as needed for moderate pain. 10 tablet 0   Multiple Vitamin (MULTIVITAMIN WITH MINERALS) TABS tablet Take 1 tablet by mouth daily.     Omega-3 Fatty Acids (FISH OIL) 1000 MG CAPS Take 1,000 mg by mouth daily.     omeprazole (PRILOSEC) 40 MG capsule Take 40 mg by mouth daily as needed (acid reflux).     ondansetron (ZOFRAN-ODT) 4 MG disintegrating tablet Take by mouth.     vitamin C (ASCORBIC ACID) 500 MG tablet Take 500 mg by mouth daily.     No current  facility-administered medications for this visit.    PHYSICAL EXAMINATION: ECOG PERFORMANCE STATUS: 1 - Symptomatic but completely ambulatory  Vitals:   12/20/22 1042  BP: (!) 149/85  Pulse: 72  Resp: 18  Temp: (!) 97.3 F (36.3 C)  SpO2: 99%   Filed Weights   12/20/22 1042  Weight: 179 lb 9.6 oz (81.5 kg)      LABORATORY DATA:  I have reviewed the data as listed    Latest Ref Rng & Units 12/06/2022   10:47 AM 10/30/2022   12:17 PM 04/14/2018   12:52 PM  CMP   Glucose 70 - 99 mg/dL 146  102  96   BUN 8 - 23 mg/dL 13  15  9    Creatinine 0.44 - 1.00 mg/dL 0.87  0.86  0.94   Sodium 135 - 145 mmol/L 140  140  142   Potassium 3.5 - 5.1 mmol/L 3.4  3.5  3.6   Chloride 98 - 111 mmol/L 106  103  108   CO2 22 - 32 mmol/L 26  31  25    Calcium 8.9 - 10.3 mg/dL 9.4  10.5  9.7   Total Protein 6.5 - 8.1 g/dL  7.8  7.4   Total Bilirubin 0.3 - 1.2 mg/dL  0.4  1.0   Alkaline Phos 38 - 126 U/L  59  79   AST 15 - 41 U/L  21  35   ALT 0 - 44 U/L  22  33     Lab Results  Component Value Date   WBC 5.6 12/06/2022   HGB 13.1 12/06/2022   HCT 39.2 12/06/2022   MCV 91.8 12/06/2022   PLT 286 12/06/2022   NEUTROABS 2.7 10/30/2022    ASSESSMENT & PLAN:  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  Ultrasound: 4 o'clock position: 1.1 cm solid papillary cancer (like DCIS) ER 100%, PR 15%, additionally 2 adjacent masses measuring 2 cm at 3 o'clock position on biopsy came back grade 1 IDC with micropapillary features, ER 100%, PR 100%, Ki-67 less than 1%, HER2 negative 1+   11/15/2022: Left breast ultrasound-guided biopsy 8:00: Grade 2 IDC   Recommendations: 1.  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 since the invasive cancer was very small there is no role of Oncotype. 3. Adjuvant radiation therapy followed by 4. Adjuvant antiestrogen therapy --------------------------------------------------------------------------------------------------------- Return  to clinic after radiation to start antiestrogen therapy    No orders of the defined types were placed in this encounter.  The patient has a good understanding of the overall plan. she agrees with it. she will call with any problems that may develop before the next visit here. Total time spent: 30 mins including face to face time and time spent for planning, charting and co-ordination of care   Harriette Ohara, MD 12/20/22    I Gardiner Coins am acting as a Education administrator for [employer]  I have reviewed the above documentation for accuracy and completeness, and I agree with the above.

## 2022-12-20 ENCOUNTER — Encounter: Payer: Self-pay | Admitting: *Deleted

## 2022-12-20 ENCOUNTER — Inpatient Hospital Stay: Payer: No Typology Code available for payment source | Admitting: Hematology and Oncology

## 2022-12-20 ENCOUNTER — Inpatient Hospital Stay
Payer: No Typology Code available for payment source | Attending: Nurse Practitioner | Admitting: Hematology and Oncology

## 2022-12-20 VITALS — BP 149/85 | HR 72 | Temp 97.3°F | Resp 18 | Ht 66.0 in | Wt 179.6 lb

## 2022-12-20 DIAGNOSIS — Z853 Personal history of malignant neoplasm of breast: Secondary | ICD-10-CM | POA: Insufficient documentation

## 2022-12-20 DIAGNOSIS — Z79899 Other long term (current) drug therapy: Secondary | ICD-10-CM | POA: Insufficient documentation

## 2022-12-20 DIAGNOSIS — C50412 Malignant neoplasm of upper-outer quadrant of left female breast: Secondary | ICD-10-CM | POA: Insufficient documentation

## 2022-12-20 DIAGNOSIS — Z170 Estrogen receptor positive status [ER+]: Secondary | ICD-10-CM | POA: Diagnosis not present

## 2022-12-20 NOTE — Assessment & Plan Note (Signed)
2000: History of right breast cancer treated with lumpectomy ER/PR positive HER2 negative, adjuvant chemo (4 cycles of TC) 5 years of tamoxifen   10/18/2022: Screening mammogram detected left breast focal asymmetry left upper outer quadrant.  Ultrasound: 4 o'clock position: 1.1 cm solid papillary cancer (like DCIS) ER 100%, PR 15%, additionally 2 adjacent masses measuring 2 cm at 3 o'clock position on biopsy came back grade 1 IDC with micropapillary features, ER 100%, PR 100%, Ki-67 less than 1%, HER2 negative 1+   11/15/2022: Left breast ultrasound-guided biopsy 8:00: Grade 2 IDC   Recommendations: 1.  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 since the invasive cancer was very small there is no role of Oncotype. 3. Adjuvant radiation therapy followed by 4. Adjuvant antiestrogen therapy --------------------------------------------------------------------------------------------------------- Return to clinic after radiation to start antiestrogen therapy

## 2022-12-24 ENCOUNTER — Encounter: Payer: Self-pay | Admitting: *Deleted

## 2022-12-26 ENCOUNTER — Other Ambulatory Visit: Payer: Self-pay | Admitting: Radiation Oncology

## 2022-12-26 ENCOUNTER — Inpatient Hospital Stay
Admission: RE | Admit: 2022-12-26 | Discharge: 2022-12-26 | Disposition: A | Discharge status: Home / Self Care | Payer: Self-pay | Source: Ambulatory Visit | Attending: Radiation Oncology | Admitting: Radiation Oncology

## 2022-12-26 DIAGNOSIS — C50412 Malignant neoplasm of upper-outer quadrant of left female breast: Secondary | ICD-10-CM

## 2023-01-02 ENCOUNTER — Encounter (HOSPITAL_COMMUNITY): Payer: Self-pay

## 2023-01-02 NOTE — Progress Notes (Addendum)
Radiation Oncology         [phone] ________________________________  Initial Outpatient Consultation - Conducted via telephone at patient request.  I spoke with the patient to conduct this visit via telephone. The patient was notified in advance and was offered an in person or telemedicine meeting to allow for face to face communication but instead preferred to proceed with a telephone visit.  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   HISTORY OF PRESENT ILLNESS: [name] is a 62 y.o. female originally seen in the multidisciplinary breast clinic for a new diagnosis of left breast cancer. The patient has a history of Stage IIA, pT1cN1, ER positive right breast cancer treated in 2000 with lumpectomy, chemotherapy, and radiation to the right breast followed by 5 years of tamoxifen.   Recently she was being seen for screening mammogram which identified an asymmetry in the left breast.  Further diagnostic workup included an ultrasound which identified a 1.1 cm mass in the left breast at 4:00 and an additional 9 mm mass in the 4 o'clock position as well in the retroareolar aspect of the breast.  There was also a 3 mm benign-appearing cyst noted in the 3 o'clock position along with 2 other irregular masses possibly microcysts measuring up to 2 cm in total.  The axilla was negative for adenopathy. She underwent biopsies on 10/18/2022.  Her specimen in the 4 o'clock position showed a low grade, solid papillary carcinoma that was ER/PR positive.  Biopsy at 3:00 also showed a grade 1 invasive ductal carcinoma with focal background of solid papillary carcinoma that was ER/PR positive HER2 negative with a Ki-67 of less than 1%.    Since her last visit, the  patient underwent a left lumpectomy with sentinel lymph node biopsy on 12/12/2022.  The 8:00 lumpectomy specimen showed fibrocystic change and a microscopic focus of fibrotic fat necrosis with calcifications, no cancer was seen in that specimen.  Her sentinel lymph node on the left was a single node submitted and was negative for carcinoma.  The 3:00 seed lumpectomy site showed a grade 1 invasive ductal carcinoma measuring 1 mm in greatest dimensions her margins were clear, her prognostic marker was ER/PR positive, HER2 negative.  The 4:00 position specimen showed a grade 1 solitary papillary carcinoma that measured 1.5 cm, her margins were negative, this tumor was ER/PR positive.  Given the small size of her tumor, Dr. Pamelia Hoit does not recommend Oncotype scoring.  She is seen to discuss adjuvant radiotherapy. She had aspiration of two of her cavities yesterday.   PREVIOUS RADIATION THERAPY: Yes   11/1999: The patient recalls 6 1/2 weeks of radiation to the right breast and regional nodes  treated by Dr. Roselind Messier. Unfortunately we do not have more detailed dosimetry records given the time since that therapy.   PAST MEDICAL HISTORY:  Past Medical History:  Diagnosis Date   Breast asymmetry 08/2018   Cancer [facility]) 2000   breast right, lumpectomy, chemo and radiation   Family history of colon cancer    Family history of melanoma    GERD (gastroesophageal reflux disease)    no meds   Hypertension    Personal history of malignant neoplasm of breast 03/14/2021  PONV (postoperative nausea and vomiting)        PAST SURGICAL HISTORY: Past Surgical History:  Procedure Laterality Date   BREAST LUMPECTOMY WITH RADIOACTIVE SEED AND SENTINEL LYMPH NODE BIOPSY Left 12/12/2022   Procedure: LEFT BREAST LUMPECTOMY WITH RADIOACTIVE SEED X3;  Surgeon: Almond Lint, MD;  Location: [facility];  Service: General;  Laterality: Left;   BREAST RECONSTRUCTION Right 07/23/2018   Procedure: RIGHT  BREAST RECONSTRUCTION;   Surgeon: Eloise Levels, MD;  Location: [facility];  Service: Plastics;  Laterality: Right;   BREAST RECONSTRUCTION Bilateral 01/31/2020   Procedure: BREAST RECONSTRUCTION;  Surgeon: Eloise Levels, MD;  Location: [facility];  Service: Plastics;  Laterality: Bilateral;   BREAST RECONSTRUCTION Left 06/26/2021   Procedure: BREAST RECONSTRUCTION WITH FAT TRANSFER;  Surgeon: Eloise Levels, MD;  Location: [facility];  Service: Plastics;  Laterality: Left;   BREAST SURGERY Right 2000   lumpectomy, sentinel node removed   CESAREAN SECTION     x 1   COLONOSCOPY WITH PROPOFOL N/A 05/21/2016   Procedure: COLONOSCOPY WITH PROPOFOL;  Surgeon: Charolett Bumpers, MD;  Location: [facility];  Service: Endoscopy;  Laterality: N/A;   LIPOSUCTION WITH LIPOFILLING Bilateral 07/23/2018   Procedure: LIPOSUCTION AND FAT GRAFTING;  Surgeon: Contogiannis, Chales Abrahams, MD;  Location: [facility];  Service: Plastics;  Laterality: Bilateral;  TO RIGHT BREAST   LIPOSUCTION WITH LIPOFILLING Right 09/28/2018   Procedure: ABDOMEN, HIPS AND FLANKS LIPOSUCTION WITH MICRO FAT GRAFTING TO RIGHT BREAST;  Surgeon: Eloise Levels, MD;  Location: [facility];  Service: Plastics;  Laterality: Right;   LIPOSUCTION WITH LIPOFILLING Right 03/02/2019   Procedure: LIPOSUCTION OF ABDOMEN HIPS AND FLANKS WITH FAT GRAFTING TO RIGHT BREAT;  Surgeon: Contogiannis, Chales Abrahams, MD;  Location: [facility];  Service: Plastics;  Laterality: Right;   LIPOSUCTION WITH LIPOFILLING Bilateral 01/31/2020   Procedure: LIPOSUCTION WITH LIPOFILLING;  Surgeon: Contogiannis, Chales Abrahams, MD;  Location: [facility];  Service: Plastics;  Laterality: Bilateral;   LIPOSUCTION WITH LIPOFILLING Left 07/18/2020   Procedure: MICRO FAT GRAFTING USING LIPOSUCTION FROM ABDOMEN AND BILATERAL FLANKS;  Surgeon: Contogiannis, Chales Abrahams, MD;  Location: [facility];  Service: Plastics;  Laterality: Left;   LIPOSUCTION WITH LIPOFILLING Left 06/26/2021   Procedure: LIPOSUCTION WITH LIPOFILLING;  Surgeon: Contogiannis, Chales Abrahams, MD;  Location: [facility];  Service: Plastics;  Laterality: Left;   MASTECTOMY W/ SENTINEL NODE BIOPSY Left 12/12/2022   Procedure: SENTINEL LYMPH NODE BIOPSY;  Surgeon: Almond Lint, MD;  Location: [facility];  Service: General;  Laterality: Left;   MASTOPEXY Left 07/13/2019   Procedure: LEFT BREAST MASTOPEXY;  Surgeon: Eloise Levels, MD;  Location: [facility];  Service: Plastics;  Laterality: Left;   TUBAL LIGATION       FAMILY HISTORY:  Family History  Problem Relation Age of Onset   Colon cancer Mother        dx in her 15s   Alcohol abuse Father    Lung cancer Maternal Aunt    Cancer Maternal Uncle        d. 77s   Cancer Paternal Aunt        NOS   Colon cancer Maternal Grandmother        dx in her 48s   Cancer Maternal Grandfather        NOS   Alcohol abuse Paternal Grandfather    Melanoma Half-Sister  dx in her 51s     SOCIAL HISTORY:  reports that she quit smoking about 36 years ago. Her smoking use included cigarettes. She has a 10.00 pack-year smoking history. She has never used smokeless tobacco. She reports current alcohol use. She reports that she does not use drugs.  The patient is divorced and [place]  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    ALLERGIES: Latex and Tape   MEDICATIONS:  Current Outpatient Medications  Medication Sig Dispense Refill   acetaminophen (TYLENOL) 500 MG tablet Take 1,000 mg by mouth every 6 (six) hours as needed for moderate pain.     calcium elemental as carbonate (TUMS ULTRA 1000) 400 MG chewable tablet Chew 1,000-2,000 mg by mouth daily as needed for heartburn.     diclofenac Sodium (VOLTAREN) 1 % GEL Apply 1 Application topically 4 (four) times daily as needed (pain).      diphenhydrAMINE (BENADRYL) 25 MG tablet Take 25 mg by mouth at bedtime.     ezetimibe (ZETIA) 10 MG tablet Take 10 mg by mouth daily.     FIBER PO Take 2 capsules by mouth daily.     fluticasone (FLONASE) 50 MCG/ACT nasal spray Place 1 spray into both nostrils daily as needed for allergies or rhinitis.     hydrochlorothiazide (MICROZIDE) 12.5 MG capsule Take 12.5 mg by mouth daily.     HYDROcodone-acetaminophen (NORCO/VICODIN) 5-325 MG tablet Take 1 tablet by mouth every 6 (six) hours as needed for moderate pain. 10 tablet 0   Multiple Vitamin (MULTIVITAMIN WITH MINERALS) TABS tablet Take 1 tablet by mouth daily.     Omega-3 Fatty Acids (FISH OIL) 1000 MG CAPS Take 1,000 mg by mouth daily.     omeprazole (PRILOSEC) 40 MG capsule Take 40 mg by mouth daily as needed (acid reflux).     vitamin C (ASCORBIC ACID) 500 MG tablet Take 500 mg by mouth daily.     No current facility-administered medications for this visit.     REVIEW OF SYSTEMS: On review of systems, the patient reports that she is doing well since surgery. She's had some fullness that was aspirated yesterday and she feels this has not reaccumulated. She's wearing her compression bra. No other complaints are verbalized.      PHYSICAL EXAM:  Unable to assess due to encounter type.    ECOG = 1  0 - Asymptomatic (Fully active, able to carry on all predisease activities without restriction)  1 - Symptomatic but completely ambulatory (Restricted in physically strenuous activity but ambulatory and able to carry out work of a light or sedentary nature. For example, light housework, office work)  2 - Symptomatic, <50% in bed during the day (Ambulatory and capable of all self care but unable to carry out any work activities. Up and about more than 50% of waking hours)  3 - Symptomatic, >50% in bed, but not bedbound (Capable of only limited self-care, confined to bed or chair 50% or more of waking hours)  4 - Bedbound (Completely  disabled. Cannot carry on any self-care. Totally confined to bed or chair)  5 - Death   Santiago Glad MM, Creech RH, Tormey DC, et al. [phone]). "Toxicity and response criteria of the [facility]". Am. Evlyn Clines. Oncol. 5 (6): 649-55    LABORATORY DATA:  Lab Results  Component Value Date   WBC 5.6 12/06/2022   HGB 13.1 12/06/2022   HCT 39.2 12/06/2022   MCV 91.8  12/06/2022   PLT 286 12/06/2022   Lab Results  Component Value Date   NA 140 12/06/2022   K 3.4 (L) 12/06/2022   CL 106 12/06/2022   CO2 26 12/06/2022   Lab Results  Component Value Date   ALT 22 10/30/2022   AST 21 10/30/2022   ALKPHOS 59 10/30/2022   BILITOT 0.4 10/30/2022      RADIOGRAPHY: No results found.     IMPRESSION/PLAN: 1.  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  Dr. Mitzi Hansen has reviewed the final pathology findings and today we reviewed early stage right breast disease.  She has done well since surgery and does not need systemic chemotherapy.  She would benefit from external radiotherapy to the breast  to reduce risks of local recurrence followed by antiestrogen therapy. We discussed the risks, benefits, short, and long term effects of radiotherapy, as well as the curative intent, and the patient is interested in proceeding. We discussed the delivery and logistics of radiotherapy and that Dr. Mitzi Hansen recommends 4 weeks of radiotherapy to the left breast with deep inspiration breath hold technique.  She will simulate on Friday and sign written consent to proceed at that time. 2. 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 She will follow up with Dr. Pamelia Hoit regarding this as well when she is in the active surveillance phase.     This encounter was conducted via telephone.  The patient has provided two factor identification  and has given verbal consent for this type of encounter and has been advised to only accept a meeting of this type in a secure network environment. The time spent during this encounter was 35 minutes including preparation, discussion, and coordination of the patient's care. The attendants for this meeting include  Ronny Bacon  and Gilda Crease.  During the encounter,   Ronny Bacon was located at [facility] Radiation Oncology Department.  MIXTLI RENO was located at home.      Osker Mason, [facility]    **Disclaimer: This note was dictated with voice recognition software. Similar sounding words can inadvertently be transcribed and this note may contain transcription errors which may not have been corrected upon publication of note.**

## 2023-01-06 ENCOUNTER — Other Ambulatory Visit: Payer: Self-pay

## 2023-01-06 ENCOUNTER — Encounter: Payer: Self-pay | Admitting: Physical Therapy

## 2023-01-06 ENCOUNTER — Ambulatory Visit: Payer: No Typology Code available for payment source | Attending: General Surgery | Admitting: Physical Therapy

## 2023-01-06 DIAGNOSIS — Z483 Aftercare following surgery for neoplasm: Secondary | ICD-10-CM | POA: Diagnosis present

## 2023-01-06 DIAGNOSIS — Z170 Estrogen receptor positive status [ER+]: Secondary | ICD-10-CM | POA: Insufficient documentation

## 2023-01-06 DIAGNOSIS — C50512 Malignant neoplasm of lower-outer quadrant of left female breast: Secondary | ICD-10-CM | POA: Insufficient documentation

## 2023-01-06 DIAGNOSIS — R293 Abnormal posture: Secondary | ICD-10-CM | POA: Insufficient documentation

## 2023-01-06 NOTE — Therapy (Signed)
OUTPATIENT PHYSICAL THERAPY BREAST CANCER POST OP FOLLOW UP   Patient Name: [name] MRN: [id number] DOB:06/03/1961, 62 y.o., female Today's Date: 01/06/2023  END OF SESSION:  PT End of Session - 01/06/23 1405     Visit Number 2    Number of Visits 2    PT Start Time 1403    PT Stop Time 1450    PT Time Calculation (min) 47 min    Activity Tolerance Patient tolerated treatment well    Behavior During Therapy [facility] for tasks assessed/performed             Past Medical History:  Diagnosis Date   Breast asymmetry 08/2018   Cancer 2000   breast right, lumpectomy, chemo and radiation   Family history of colon cancer    Family history of melanoma    GERD (gastroesophageal reflux disease)    no meds   Hypertension    Personal history of malignant neoplasm of breast 03/14/2021   PONV (postoperative nausea and vomiting)    Past Surgical History:  Procedure Laterality Date   BREAST LUMPECTOMY WITH RADIOACTIVE SEED AND SENTINEL LYMPH NODE BIOPSY Left 12/12/2022   Procedure: LEFT BREAST LUMPECTOMY WITH RADIOACTIVE SEED X3;  Surgeon: Almond Lint, MD;  Location: [facility];  Service: General;  Laterality: Left;   BREAST RECONSTRUCTION Right 07/23/2018   Procedure: RIGHT  BREAST RECONSTRUCTION;  Surgeon: Eloise Levels, MD;  Location: [facility];  Service: Plastics;  Laterality: Right;   BREAST RECONSTRUCTION Bilateral 01/31/2020   Procedure: BREAST RECONSTRUCTION;  Surgeon: Eloise Levels, MD;  Location: [facility];  Service: Plastics;  Laterality: Bilateral;   BREAST RECONSTRUCTION Left 06/26/2021   Procedure: BREAST RECONSTRUCTION WITH FAT TRANSFER;  Surgeon: Eloise Levels, MD;  Location: [facility];  Service: Plastics;  Laterality: Left;   BREAST SURGERY Right 2000   lumpectomy, sentinel node removed   CESAREAN SECTION     x 1   COLONOSCOPY WITH PROPOFOL N/A 05/21/2016   Procedure: COLONOSCOPY WITH PROPOFOL;   Surgeon: Charolett Bumpers, MD;  Location: [facility];  Service: Endoscopy;  Laterality: N/A;   LIPOSUCTION WITH LIPOFILLING Bilateral 07/23/2018   Procedure: LIPOSUCTION AND FAT GRAFTING;  Surgeon: Contogiannis, Chales Abrahams, MD;  Location: [facility];  Service: Plastics;  Laterality: Bilateral;  TO RIGHT BREAST   LIPOSUCTION WITH LIPOFILLING Right 09/28/2018   Procedure: ABDOMEN, HIPS AND FLANKS LIPOSUCTION WITH MICRO FAT GRAFTING TO RIGHT BREAST;  Surgeon: Eloise Levels, MD;  Location: [facility];  Service: Plastics;  Laterality: Right;   LIPOSUCTION WITH LIPOFILLING Right 03/02/2019   Procedure: LIPOSUCTION OF ABDOMEN HIPS AND FLANKS WITH FAT GRAFTING TO RIGHT BREAT;  Surgeon: Contogiannis, Chales Abrahams, MD;  Location: [facility];  Service: Plastics;  Laterality: Right;   LIPOSUCTION WITH LIPOFILLING Bilateral 01/31/2020   Procedure: LIPOSUCTION WITH LIPOFILLING;  Surgeon: Contogiannis, Chales Abrahams, MD;  Location: [facility];  Service: Plastics;  Laterality: Bilateral;   LIPOSUCTION WITH LIPOFILLING Left 07/18/2020   Procedure: MICRO FAT GRAFTING USING LIPOSUCTION FROM ABDOMEN AND BILATERAL FLANKS;  Surgeon: Contogiannis, Chales Abrahams, MD;  Location: [facility];  Service: Plastics;  Laterality: Left;   LIPOSUCTION WITH LIPOFILLING Left 06/26/2021   Procedure: LIPOSUCTION WITH LIPOFILLING;  Surgeon: Contogiannis, Chales Abrahams, MD;  Location:  [facility];  Service: Plastics;  Laterality: Left;   MASTECTOMY W/ SENTINEL NODE BIOPSY Left 12/12/2022   Procedure: SENTINEL LYMPH NODE BIOPSY;  Surgeon: Almond LintByerly, Faera, MD;  [facility];  Service: General;  Laterality: Left;   MASTOPEXY Left 07/13/2019   Procedure: LEFT BREAST MASTOPEXY;  Surgeon: Eloise Levelsontogiannis, Mary Ann, MD;  Location: [facility];  Service: Plastics;  Laterality: Left;   TUBAL LIGATION     Patient Active Problem List   Diagnosis Date Noted    Primary malignant neoplasm of upper outer quadrant of breast, left 10/28/2022   Genetic testing 04/03/2021   Family history of colon cancer 03/14/2021   Family history of melanoma 03/14/2021   Personal history of malignant neoplasm of breast 03/14/2021    REFERRING PROVIDER: Dr. Almond LintFaera Byerly  REFERRING DIAG: Left breast cancer  THERAPY DIAG:  Malignant neoplasm of lower-outer quadrant of left breast of female, estrogen receptor positive  Abnormal posture  Aftercare following surgery for neoplasm  Rationale for Evaluation and Treatment: Rehabilitation  ONSET DATE: 12/12/2022  SUBJECTIVE:                                                                                                                                                                                           SUBJECTIVE STATEMENT: Patient reports she underwent a left double lumpectomy and sentinel node biopsy (2 negative nodes) on 12/12/2022. She will undergo radiation and anti-estrogen therapy. She saw the surgeon today and has a seroma drained.  PERTINENT HISTORY:  Patient was diagnosed on 10/03/2022 with left grade 1 invasive ductal carcinoma breast cancer. She underwent a left lumpectomy and sentinel node biopsy (2 negative nodes) on 12/12/2022. It is ER/PR positive and HER2 negative with a Ki67 of <1%. She had right breast cancer in 2000 with a right lumpectomy and sentinel node biopsy (2 negative nodes removed), chemotherapy and radiation. No hx right arm lymphedema.   PATIENT GOALS:  Reassess how my recovery is going related to arm function, pain, and swelling.  PAIN:  Are you having pain? Yes: NPRS scale: 1/10 Pain location: incision site at axilla Pain description: tenderness Aggravating factors: nothing Relieving factors: nothing  PRECAUTIONS: Recent Surgery, left UE Lymphedema risk, Other: Hx right breast cancer with right UE lymphedema risk  ACTIVITY LEVEL / LEISURE: She does a 40 min exercise class  2-3x/week, Pilates once a week, and walks on the other days 30-40 min    OBJECTIVE:   PATIENT SURVEYS:  QUICK DASH:  Quick Dash - 01/06/23 0001     Open a tight or new jar No difficulty    Do heavy household chores (wash walls, wash floors) No difficulty    Carry a shopping bag or briefcase No difficulty    Wash your back No difficulty  Use a knife to cut food No difficulty    Recreational activities in which you take some force or impact through your arm, shoulder, or hand (golf, hammering, tennis) No difficulty    During the past week, to what extent has your arm, shoulder or hand problem interfered with your normal social activities with family, friends, neighbors, or groups? Not at all    During the past week, to what extent has your arm, shoulder or hand problem limited your work or other regular daily activities Not at all    Arm, shoulder, or hand pain. None    Tingling (pins and needles) in your arm, shoulder, or hand None    Difficulty Sleeping No difficulty    DASH Score 0 %              OBSERVATIONS: Left axillary incision is well healed. Unable to assess breast incision because her surgeon just aspirated her seroma and covered it with a bandage. Left breast with significant edema present but no redness noted or c/o pain with gentle palpation. No axillary cording noted.  POSTURE:  Forward head and rounded shoulders  LYMPHEDEMA ASSESSMENT:   UPPER EXTREMITY AROM/PROM:   A/PROM RIGHT   eval    Shoulder extension 51  Shoulder flexion 154  Shoulder abduction 160  Shoulder internal rotation 52  Shoulder external rotation 73                          (Blank rows = not tested)   A/PROM LEFT   eval LEFT 01/06/2023  Shoulder extension 53 55  Shoulder flexion 142 146  Shoulder abduction 152 158  Shoulder internal rotation 64 52  Shoulder external rotation 67 73                          (Blank rows = not tested)   UPPER EXTREMITY STRENGTH: WFL   LYMPHEDEMA  ASSESSMENTS:    LANDMARK RIGHT   eval RIGHT 01/06/2023  10 cm proximal to olecranon process 29 29.1  Olecranon process 25.9 25.3  10 cm proximal to ulnar styloid process 23.1 22.5  Just proximal to ulnar styloid process 16.4 16.1  Across hand at thumb web space 19.7 19.4  At base of 2nd digit 6.7 6.4  (Blank rows = not tested)   LANDMARK LEFT   eval LEFT 01/06/2023  10 cm proximal to olecranon process 29.1 29.5  Olecranon process 26.1 26.3  10 cm proximal to ulnar styloid process 23 22.2  Just proximal to ulnar styloid process 16.3 15.9  Across hand at thumb web space 19 19.1  At base of 2nd digit 6.4 6.4  (Blank rows = not tested)    Surgery type/Date: Left lumpectomy and sentinel node biopsy 12/12/222 Number of lymph nodes removed: 2 Current/past treatment (chemo, radiation, hormone therapy): none for left side; had chemo and radiation on right in 2000 Other symptoms:  Heaviness/tightness No Pain Yes Pitting edema No Infections No Decreased scar mobility Yes Stemmer sign No  PATIENT EDUCATION:  Education details: HEP Person educated: Patient Education method: Explanation Education comprehension: verbalized understanding  HOME EXERCISE PROGRAM: Reviewed previously given post op HEP.  ASSESSMENT:  CLINICAL IMPRESSION: Patient is doing very well /p left lumpectomy and sentinel node biopsy on 12/12/2022. She had 2 negative nodes removed. Her shoulder ROM is restored to baseline and there is no sign of left arm lymphedema. She has significant edema in  her left breast which is being addressed by her surgeon and was aspirated earlier today. I was unable to assess her breast incision due to a dressing placed by her surgeon but her axillary incision is well healed. She was encouraged to contact me if her breast edema does not resolve as she may benefit from manual lymph drainage. Otherwise, she has no PT needs at this time.  Pt will benefit from skilled therapeutic intervention  to improve on the following deficits: Decreased knowledge of precautions, impaired UE functional use, pain, decreased ROM, postural dysfunction.   PT treatment/interventions: ADL/Self care home management, Therapeutic exercises, Patient/Family education, Self Care, and Re-evaluation   GOALS: Goals reviewed with patient? Yes  LONG TERM GOALS:  (STG=LTG)  GOALS Name Target Date  Goal status  1 Pt will demonstrate she has regained full shoulder ROM and function post operatively compared to baselines.  Baseline: 12/25/2022 MET     PLAN:  PT FREQUENCY/DURATION: N/A  PLAN FOR NEXT SESSION: D/C - continue with SOZO screens   [facility]  [address]  [place] [address]  [phone]  After Breast Cancer Class It is recommended you attend the ABC class to be educated on lymphedema risk reduction. This class is free of charge and lasts for 1 hour. It is a 1-time class. You will need to download the Webex app either on your phone or computer. We will send you a link the night before or the morning of the class. You should be able to click on that link to join the class. This is not a confidential class. You don't have to turn your camera on, but other participants may be able to see your email address. You're scheduled for 01/13/2023.  Scar massage You can begin gentle scar massage to you incision sites. Gently place one hand on the incision and move the skin (without sliding on the skin) in various directions. Do this for a few minutes and then you can gently massage either coconut oil or vitamin E cream into the scars.  Compression garment You should continue wearing your compression bra until you feel like you no longer have swelling.  Home exercise Program Continue doing the exercises you were given until you feel like you can do them without feeling any tightness at the end.   Walking Program Studies show that 30 minutes of walking per day (fast  enough to elevate your heart rate) can significantly reduce the risk of a cancer recurrence. If you can't walk due to other medical reasons, we encourage you to find another activity you could do (like a stationary bike or water exercise).  Posture After breast cancer surgery, people frequently sit with rounded shoulders posture because it puts their incisions on slack and feels better. If you sit like this and scar tissue forms in that position, you can become very tight and have pain sitting or standing with good posture. Try to be aware of your posture and sit and stand up tall to heal properly.  Follow up PT: It is recommended you return every 3 months for the first 3 years following surgery to be assessed on the SOZO machine for an L-Dex score. This helps prevent clinically significant lymphedema in 95% of patients. These follow up screens are 10 minute appointments that you are not billed for. You're scheduled for June 17th at 4:00.  Jakhia Buxton, [place] 01/06/23 2:58 PM  PHYSICAL THERAPY DISCHARGE SUMMARY  Visits from Start of  Care: 2  Current functional level related to goals / functional outcomes: Goals met; see above   Remaining deficits: Edema left breast   Education / Equipment: HEP; lymphedema education   Patient agrees to discharge. Patient goals were met. Patient is being discharged due to meeting the stated rehab goals.  Jolette Lana, [place] 01/06/23 2:59 PM

## 2023-01-06 NOTE — Patient Instructions (Signed)
[facility]  [address]  [place] [address]  [phone]  After Breast Cancer Class It is recommended you attend the ABC class to be educated on lymphedema risk reduction. This class is free of charge and lasts for 1 hour. It is a 1-time class. You will need to download the Webex app either on your phone or computer. We will send you a link the night before or the morning of the class. You should be able to click on that link to join the class. This is not a confidential class. You don't have to turn your camera on, but other participants may be able to see your email address. You're scheduled for 01/13/2023.  Scar massage You can begin gentle scar massage to you incision sites. Gently place one hand on the incision and move the skin (without sliding on the skin) in various directions. Do this for a few minutes and then you can gently massage either coconut oil or vitamin E cream into the scars.  Compression garment You should continue wearing your compression bra until you feel like you no longer have swelling.  Home exercise Program Continue doing the exercises you were given until you feel like you can do them without feeling any tightness at the end.   Walking Program Studies show that 30 minutes of walking per day (fast enough to elevate your heart rate) can significantly reduce the risk of a cancer recurrence. If you can't walk due to other medical reasons, we encourage you to find another activity you could do (like a stationary bike or water exercise).  Posture After breast cancer surgery, people frequently sit with rounded shoulders posture because it puts their incisions on slack and feels better. If you sit like this and scar tissue forms in that position, you can become very tight and have pain sitting or standing with good posture. Try to be aware of your posture and sit and stand up tall to heal properly.  Follow up PT: It is recommended you  return every 3 months for the first 3 years following surgery to be assessed on the SOZO machine for an L-Dex score. This helps prevent clinically significant lymphedema in 95% of patients. These follow up screens are 10 minute appointments that you are not billed for. You're scheduled for June 17th at 4:00.

## 2023-01-07 ENCOUNTER — Encounter: Payer: Self-pay | Admitting: Radiation Oncology

## 2023-01-07 ENCOUNTER — Ambulatory Visit
Admission: RE | Admit: 2023-01-07 | Discharge: 2023-01-07 | Disposition: A | Discharge status: Home / Self Care | Payer: No Typology Code available for payment source | Source: Ambulatory Visit | Attending: Radiation Oncology | Admitting: Radiation Oncology

## 2023-01-07 VITALS — Ht 66.0 in | Wt 180.0 lb

## 2023-01-07 DIAGNOSIS — C50412 Malignant neoplasm of upper-outer quadrant of left female breast: Secondary | ICD-10-CM

## 2023-01-07 NOTE — Progress Notes (Signed)
Telephone nursing interview for Primary malignant neoplasm of upper outer quadrant of breast, left.  Patient identity verified.   Patient reports recent draining of seroma, and incision line healing well. No discomfort at this time.  Meaningful use complete. Postmenopausal  Ht 5\' 6"  (1.676 m)   Wt 180 lb (81.6 kg)   BMI 29.05 kg/m    This concludes the interview.   Ruel Favors, LPN

## 2023-01-08 ENCOUNTER — Ambulatory Visit: Payer: No Typology Code available for payment source | Admitting: Radiation Oncology

## 2023-01-10 ENCOUNTER — Ambulatory Visit
Admission: RE | Admit: 2023-01-10 | Discharge: 2023-01-10 | Disposition: A | Discharge status: Home / Self Care | Payer: No Typology Code available for payment source | Source: Ambulatory Visit | Attending: Radiation Oncology | Admitting: Radiation Oncology

## 2023-01-10 ENCOUNTER — Other Ambulatory Visit: Payer: Self-pay

## 2023-01-10 DIAGNOSIS — Z170 Estrogen receptor positive status [ER+]: Secondary | ICD-10-CM | POA: Diagnosis not present

## 2023-01-10 DIAGNOSIS — C50412 Malignant neoplasm of upper-outer quadrant of left female breast: Secondary | ICD-10-CM | POA: Insufficient documentation

## 2023-01-10 DIAGNOSIS — Z510 Encounter for antineoplastic radiation therapy: Secondary | ICD-10-CM | POA: Insufficient documentation

## 2023-01-15 ENCOUNTER — Encounter: Payer: Self-pay | Admitting: *Deleted

## 2023-01-15 DIAGNOSIS — Z170 Estrogen receptor positive status [ER+]: Secondary | ICD-10-CM

## 2023-01-16 ENCOUNTER — Telehealth: Payer: Self-pay | Admitting: Hematology and Oncology

## 2023-01-16 NOTE — Telephone Encounter (Signed)
Scheduled appointment per scheduling message. Left voicemail.  

## 2023-01-17 DIAGNOSIS — Z510 Encounter for antineoplastic radiation therapy: Secondary | ICD-10-CM | POA: Diagnosis not present

## 2023-01-20 ENCOUNTER — Telehealth: Payer: Self-pay | Admitting: Hematology and Oncology

## 2023-01-20 NOTE — Telephone Encounter (Signed)
Rescheduled appointment per patients request. Patient is aware of the changes made to her upcoming appointment. 

## 2023-01-21 ENCOUNTER — Ambulatory Visit
Admission: RE | Admit: 2023-01-21 | Discharge: 2023-01-21 | Disposition: A | Discharge status: Home / Self Care | Payer: No Typology Code available for payment source | Source: Ambulatory Visit | Attending: Radiation Oncology | Admitting: Radiation Oncology

## 2023-01-21 ENCOUNTER — Other Ambulatory Visit: Payer: Self-pay

## 2023-01-21 DIAGNOSIS — Z510 Encounter for antineoplastic radiation therapy: Secondary | ICD-10-CM | POA: Diagnosis not present

## 2023-01-21 LAB — RAD ONC ARIA SESSION SUMMARY
Course Elapsed Days: 0
Plan Fractions Treated to Date: 1
Plan Prescribed Dose Per Fraction: 2.66 Gy
Plan Total Fractions Prescribed: 16
Plan Total Prescribed Dose: 42.56 Gy
Reference Point Dosage Given to Date: 2.66 Gy
Reference Point Session Dosage Given: 2.66 Gy
Session Number: 1

## 2023-01-22 ENCOUNTER — Other Ambulatory Visit: Payer: Self-pay

## 2023-01-22 ENCOUNTER — Ambulatory Visit
Admission: RE | Admit: 2023-01-22 | Discharge: 2023-01-22 | Disposition: A | Discharge status: Home / Self Care | Payer: No Typology Code available for payment source | Source: Ambulatory Visit | Attending: Radiation Oncology | Admitting: Radiation Oncology

## 2023-01-22 DIAGNOSIS — Z510 Encounter for antineoplastic radiation therapy: Secondary | ICD-10-CM | POA: Diagnosis not present

## 2023-01-22 LAB — RAD ONC ARIA SESSION SUMMARY
Course Elapsed Days: 1
Plan Fractions Treated to Date: 2
Plan Prescribed Dose Per Fraction: 2.66 Gy
Plan Total Fractions Prescribed: 16
Plan Total Prescribed Dose: 42.56 Gy
Reference Point Dosage Given to Date: 5.32 Gy
Reference Point Session Dosage Given: 2.66 Gy
Session Number: 2

## 2023-01-23 ENCOUNTER — Ambulatory Visit
Admission: RE | Admit: 2023-01-23 | Discharge: 2023-01-23 | Disposition: A | Discharge status: Home / Self Care | Payer: No Typology Code available for payment source | Source: Ambulatory Visit | Attending: Radiation Oncology | Admitting: Radiation Oncology

## 2023-01-23 ENCOUNTER — Ambulatory Visit: Payer: No Typology Code available for payment source | Admitting: Radiation Oncology

## 2023-01-23 ENCOUNTER — Other Ambulatory Visit: Payer: Self-pay

## 2023-01-23 DIAGNOSIS — Z510 Encounter for antineoplastic radiation therapy: Secondary | ICD-10-CM | POA: Diagnosis not present

## 2023-01-23 LAB — RAD ONC ARIA SESSION SUMMARY
Course Elapsed Days: 2
Plan Fractions Treated to Date: 3
Plan Prescribed Dose Per Fraction: 2.66 Gy
Plan Total Fractions Prescribed: 16
Plan Total Prescribed Dose: 42.56 Gy
Reference Point Dosage Given to Date: 7.98 Gy
Reference Point Session Dosage Given: 2.66 Gy
Session Number: 3

## 2023-01-24 ENCOUNTER — Ambulatory Visit: Payer: No Typology Code available for payment source

## 2023-01-24 ENCOUNTER — Other Ambulatory Visit: Payer: Self-pay

## 2023-01-24 ENCOUNTER — Ambulatory Visit
Admission: RE | Admit: 2023-01-24 | Discharge: 2023-01-24 | Disposition: A | Discharge status: Home / Self Care | Payer: No Typology Code available for payment source | Source: Ambulatory Visit | Attending: Radiation Oncology | Admitting: Radiation Oncology

## 2023-01-24 ENCOUNTER — Ambulatory Visit
Admission: RE | Admit: 2023-01-24 | Discharge: 2023-01-24 | Disposition: A | Discharge status: Home / Self Care | Payer: No Typology Code available for payment source | Source: Ambulatory Visit | Attending: Radiation Oncology

## 2023-01-24 DIAGNOSIS — C50412 Malignant neoplasm of upper-outer quadrant of left female breast: Secondary | ICD-10-CM

## 2023-01-24 DIAGNOSIS — Z510 Encounter for antineoplastic radiation therapy: Secondary | ICD-10-CM | POA: Diagnosis not present

## 2023-01-24 LAB — RAD ONC ARIA SESSION SUMMARY
Course Elapsed Days: 3
Plan Fractions Treated to Date: 4
Plan Prescribed Dose Per Fraction: 2.66 Gy
Plan Total Fractions Prescribed: 16
Plan Total Prescribed Dose: 42.56 Gy
Reference Point Dosage Given to Date: 10.64 Gy
Reference Point Session Dosage Given: 2.66 Gy
Session Number: 4

## 2023-01-24 MED ORDER — ALRA NON-METALLIC DEODORANT (RAD-ONC)
1.0000 | Freq: Once | TOPICAL | Status: AC
  Administered 2023-01-24: 1 via TOPICAL

## 2023-01-24 MED ORDER — RADIAPLEXRX EX GEL: Freq: Once | CUTANEOUS | Status: AC

## 2023-01-24 NOTE — Addendum Note (Signed)
Encounter addended by: Moorea Boissonneault C on: 01/24/2023 7:32 AM  Actions taken: Imaging Exam ended

## 2023-01-24 NOTE — Addendum Note (Signed)
Encounter addended by: Pamelia Hoit on: 01/24/2023 7:32 AM  Actions taken: Imaging Exam ended

## 2023-01-27 ENCOUNTER — Ambulatory Visit
Admission: RE | Admit: 2023-01-27 | Discharge: 2023-01-27 | Disposition: A | Discharge status: Home / Self Care | Payer: No Typology Code available for payment source | Source: Ambulatory Visit | Attending: Radiation Oncology | Admitting: Radiation Oncology

## 2023-01-27 ENCOUNTER — Other Ambulatory Visit: Payer: Self-pay

## 2023-01-27 DIAGNOSIS — Z510 Encounter for antineoplastic radiation therapy: Secondary | ICD-10-CM | POA: Diagnosis not present

## 2023-01-27 LAB — RAD ONC ARIA SESSION SUMMARY
Course Elapsed Days: 6
Plan Fractions Treated to Date: 5
Plan Prescribed Dose Per Fraction: 2.66 Gy
Plan Total Fractions Prescribed: 16
Plan Total Prescribed Dose: 42.56 Gy
Reference Point Dosage Given to Date: 13.3 Gy
Reference Point Session Dosage Given: 2.66 Gy
Session Number: 5

## 2023-01-28 ENCOUNTER — Other Ambulatory Visit: Payer: Self-pay

## 2023-01-28 ENCOUNTER — Ambulatory Visit
Admission: RE | Admit: 2023-01-28 | Discharge: 2023-01-28 | Disposition: A | Discharge status: Home / Self Care | Payer: No Typology Code available for payment source | Source: Ambulatory Visit | Attending: Radiation Oncology

## 2023-01-28 DIAGNOSIS — Z510 Encounter for antineoplastic radiation therapy: Secondary | ICD-10-CM | POA: Diagnosis not present

## 2023-01-28 LAB — RAD ONC ARIA SESSION SUMMARY
Course Elapsed Days: 7
Plan Fractions Treated to Date: 6
Plan Prescribed Dose Per Fraction: 2.66 Gy
Plan Total Fractions Prescribed: 16
Plan Total Prescribed Dose: 42.56 Gy
Reference Point Dosage Given to Date: 15.96 Gy
Reference Point Session Dosage Given: 2.66 Gy
Session Number: 6

## 2023-01-29 ENCOUNTER — Ambulatory Visit
Admission: RE | Admit: 2023-01-29 | Discharge: 2023-01-29 | Disposition: A | Discharge status: Home / Self Care | Payer: No Typology Code available for payment source | Source: Ambulatory Visit | Attending: Radiation Oncology | Admitting: Radiation Oncology

## 2023-01-29 ENCOUNTER — Other Ambulatory Visit: Payer: Self-pay

## 2023-01-29 DIAGNOSIS — Z510 Encounter for antineoplastic radiation therapy: Secondary | ICD-10-CM | POA: Diagnosis present

## 2023-01-29 DIAGNOSIS — Z170 Estrogen receptor positive status [ER+]: Secondary | ICD-10-CM | POA: Insufficient documentation

## 2023-01-29 DIAGNOSIS — C50412 Malignant neoplasm of upper-outer quadrant of left female breast: Secondary | ICD-10-CM | POA: Insufficient documentation

## 2023-01-29 LAB — RAD ONC ARIA SESSION SUMMARY
Course Elapsed Days: 8
Plan Fractions Treated to Date: 7
Plan Prescribed Dose Per Fraction: 2.66 Gy
Plan Total Fractions Prescribed: 16
Plan Total Prescribed Dose: 42.56 Gy
Reference Point Dosage Given to Date: 18.62 Gy
Reference Point Session Dosage Given: 2.66 Gy
Session Number: 7

## 2023-01-30 ENCOUNTER — Ambulatory Visit
Admission: RE | Admit: 2023-01-30 | Discharge: 2023-01-30 | Disposition: A | Discharge status: Home / Self Care | Payer: No Typology Code available for payment source | Source: Ambulatory Visit | Attending: Radiation Oncology | Admitting: Radiation Oncology

## 2023-01-30 ENCOUNTER — Other Ambulatory Visit: Payer: Self-pay

## 2023-01-30 DIAGNOSIS — Z510 Encounter for antineoplastic radiation therapy: Secondary | ICD-10-CM | POA: Diagnosis not present

## 2023-01-30 LAB — RAD ONC ARIA SESSION SUMMARY
Course Elapsed Days: 9
Plan Fractions Treated to Date: 8
Plan Prescribed Dose Per Fraction: 2.66 Gy
Plan Total Fractions Prescribed: 16
Plan Total Prescribed Dose: 42.56 Gy
Reference Point Dosage Given to Date: 21.28 Gy
Reference Point Session Dosage Given: 2.66 Gy
Session Number: 8

## 2023-01-31 ENCOUNTER — Other Ambulatory Visit: Payer: Self-pay

## 2023-01-31 ENCOUNTER — Ambulatory Visit
Admission: RE | Admit: 2023-01-31 | Discharge: 2023-01-31 | Disposition: A | Discharge status: Home / Self Care | Payer: No Typology Code available for payment source | Source: Ambulatory Visit | Attending: Radiation Oncology | Admitting: Radiation Oncology

## 2023-01-31 DIAGNOSIS — Z510 Encounter for antineoplastic radiation therapy: Secondary | ICD-10-CM | POA: Diagnosis not present

## 2023-01-31 LAB — RAD ONC ARIA SESSION SUMMARY
Course Elapsed Days: 10
Plan Fractions Treated to Date: 9
Plan Prescribed Dose Per Fraction: 2.66 Gy
Plan Total Fractions Prescribed: 16
Plan Total Prescribed Dose: 42.56 Gy
Reference Point Dosage Given to Date: 23.94 Gy
Reference Point Session Dosage Given: 2.66 Gy
Session Number: 9

## 2023-02-03 ENCOUNTER — Other Ambulatory Visit: Payer: Self-pay

## 2023-02-03 ENCOUNTER — Ambulatory Visit
Admission: RE | Admit: 2023-02-03 | Discharge: 2023-02-03 | Disposition: A | Discharge status: Home / Self Care | Payer: No Typology Code available for payment source | Source: Ambulatory Visit | Attending: Radiation Oncology | Admitting: Radiation Oncology

## 2023-02-03 DIAGNOSIS — Z510 Encounter for antineoplastic radiation therapy: Secondary | ICD-10-CM | POA: Diagnosis not present

## 2023-02-03 LAB — RAD ONC ARIA SESSION SUMMARY
Course Elapsed Days: 13
Plan Fractions Treated to Date: 10
Plan Prescribed Dose Per Fraction: 2.66 Gy
Plan Total Fractions Prescribed: 16
Plan Total Prescribed Dose: 42.56 Gy
Reference Point Dosage Given to Date: 26.6 Gy
Reference Point Session Dosage Given: 2.66 Gy
Session Number: 10

## 2023-02-04 ENCOUNTER — Ambulatory Visit
Admission: RE | Admit: 2023-02-04 | Discharge: 2023-02-04 | Disposition: A | Discharge status: Home / Self Care | Payer: No Typology Code available for payment source | Source: Ambulatory Visit | Attending: Radiation Oncology | Admitting: Radiation Oncology

## 2023-02-04 ENCOUNTER — Other Ambulatory Visit: Payer: Self-pay

## 2023-02-04 DIAGNOSIS — Z510 Encounter for antineoplastic radiation therapy: Secondary | ICD-10-CM | POA: Diagnosis not present

## 2023-02-04 LAB — RAD ONC ARIA SESSION SUMMARY
Course Elapsed Days: 14
Plan Fractions Treated to Date: 11
Plan Prescribed Dose Per Fraction: 2.66 Gy
Plan Total Fractions Prescribed: 16
Plan Total Prescribed Dose: 42.56 Gy
Reference Point Dosage Given to Date: 29.26 Gy
Reference Point Session Dosage Given: 2.66 Gy
Session Number: 11

## 2023-02-05 ENCOUNTER — Ambulatory Visit
Admission: RE | Admit: 2023-02-05 | Discharge: 2023-02-05 | Disposition: A | Discharge status: Home / Self Care | Payer: No Typology Code available for payment source | Source: Ambulatory Visit | Attending: Radiation Oncology

## 2023-02-05 ENCOUNTER — Other Ambulatory Visit: Payer: Self-pay

## 2023-02-05 DIAGNOSIS — Z510 Encounter for antineoplastic radiation therapy: Secondary | ICD-10-CM | POA: Diagnosis not present

## 2023-02-05 LAB — RAD ONC ARIA SESSION SUMMARY
Course Elapsed Days: 15
Plan Fractions Treated to Date: 12
Plan Prescribed Dose Per Fraction: 2.66 Gy
Plan Total Fractions Prescribed: 16
Plan Total Prescribed Dose: 42.56 Gy
Reference Point Dosage Given to Date: 31.92 Gy
Reference Point Session Dosage Given: 2.66 Gy
Session Number: 12

## 2023-02-06 ENCOUNTER — Ambulatory Visit
Admission: RE | Admit: 2023-02-06 | Discharge: 2023-02-06 | Disposition: A | Discharge status: Home / Self Care | Payer: No Typology Code available for payment source | Source: Ambulatory Visit | Attending: Radiation Oncology | Admitting: Radiation Oncology

## 2023-02-06 ENCOUNTER — Other Ambulatory Visit: Payer: Self-pay

## 2023-02-06 DIAGNOSIS — Z510 Encounter for antineoplastic radiation therapy: Secondary | ICD-10-CM | POA: Diagnosis not present

## 2023-02-06 LAB — RAD ONC ARIA SESSION SUMMARY
Course Elapsed Days: 16
Plan Fractions Treated to Date: 13
Plan Prescribed Dose Per Fraction: 2.66 Gy
Plan Total Fractions Prescribed: 16
Plan Total Prescribed Dose: 42.56 Gy
Reference Point Dosage Given to Date: 34.58 Gy
Reference Point Session Dosage Given: 2.66 Gy
Session Number: 13

## 2023-02-07 ENCOUNTER — Ambulatory Visit: Payer: No Typology Code available for payment source | Admitting: Radiation Oncology

## 2023-02-07 ENCOUNTER — Ambulatory Visit
Admission: RE | Admit: 2023-02-07 | Discharge: 2023-02-07 | Disposition: A | Discharge status: Home / Self Care | Payer: No Typology Code available for payment source | Source: Ambulatory Visit | Attending: Radiation Oncology | Admitting: Radiation Oncology

## 2023-02-07 ENCOUNTER — Ambulatory Visit
Admission: RE | Admit: 2023-02-07 | Discharge: 2023-02-07 | Disposition: A | Discharge status: Home / Self Care | Payer: No Typology Code available for payment source | Source: Ambulatory Visit | Attending: Radiation Oncology

## 2023-02-07 ENCOUNTER — Other Ambulatory Visit: Payer: Self-pay

## 2023-02-07 DIAGNOSIS — Z510 Encounter for antineoplastic radiation therapy: Secondary | ICD-10-CM | POA: Diagnosis not present

## 2023-02-07 LAB — RAD ONC ARIA SESSION SUMMARY
Course Elapsed Days: 17
Plan Fractions Treated to Date: 14
Plan Prescribed Dose Per Fraction: 2.66 Gy
Plan Total Fractions Prescribed: 16
Plan Total Prescribed Dose: 42.56 Gy
Reference Point Dosage Given to Date: 37.24 Gy
Reference Point Session Dosage Given: 2.66 Gy
Session Number: 14

## 2023-02-10 ENCOUNTER — Ambulatory Visit
Admission: RE | Admit: 2023-02-10 | Discharge: 2023-02-10 | Disposition: A | Discharge status: Home / Self Care | Payer: No Typology Code available for payment source | Source: Ambulatory Visit | Attending: Radiation Oncology | Admitting: Radiation Oncology

## 2023-02-10 ENCOUNTER — Other Ambulatory Visit: Payer: Self-pay

## 2023-02-10 DIAGNOSIS — Z510 Encounter for antineoplastic radiation therapy: Secondary | ICD-10-CM | POA: Diagnosis not present

## 2023-02-10 LAB — RAD ONC ARIA SESSION SUMMARY
Course Elapsed Days: 20
Plan Fractions Treated to Date: 15
Plan Prescribed Dose Per Fraction: 2.66 Gy
Plan Total Fractions Prescribed: 16
Plan Total Prescribed Dose: 42.56 Gy
Reference Point Dosage Given to Date: 39.9 Gy
Reference Point Session Dosage Given: 2.66 Gy
Session Number: 15

## 2023-02-11 ENCOUNTER — Ambulatory Visit
Admission: RE | Admit: 2023-02-11 | Discharge: 2023-02-11 | Disposition: A | Discharge status: Home / Self Care | Payer: No Typology Code available for payment source | Source: Ambulatory Visit | Attending: Radiation Oncology | Admitting: Radiation Oncology

## 2023-02-11 ENCOUNTER — Other Ambulatory Visit: Payer: Self-pay

## 2023-02-11 DIAGNOSIS — Z510 Encounter for antineoplastic radiation therapy: Secondary | ICD-10-CM | POA: Diagnosis not present

## 2023-02-11 LAB — RAD ONC ARIA SESSION SUMMARY
Course Elapsed Days: 21
Plan Fractions Treated to Date: 16
Plan Prescribed Dose Per Fraction: 2.66 Gy
Plan Total Fractions Prescribed: 16
Plan Total Prescribed Dose: 42.56 Gy
Reference Point Dosage Given to Date: 42.56 Gy
Reference Point Session Dosage Given: 2.66 Gy
Session Number: 16

## 2023-02-12 ENCOUNTER — Ambulatory Visit: Payer: No Typology Code available for payment source

## 2023-02-12 ENCOUNTER — Other Ambulatory Visit: Payer: Self-pay

## 2023-02-12 DIAGNOSIS — Z510 Encounter for antineoplastic radiation therapy: Secondary | ICD-10-CM | POA: Diagnosis not present

## 2023-02-12 LAB — RAD ONC ARIA SESSION SUMMARY
Course Elapsed Days: 22
Plan Fractions Treated to Date: 1
Plan Prescribed Dose Per Fraction: 2 Gy
Plan Total Fractions Prescribed: 4
Plan Total Prescribed Dose: 8 Gy
Reference Point Dosage Given to Date: 2 Gy
Reference Point Session Dosage Given: 2 Gy
Session Number: 17

## 2023-02-13 ENCOUNTER — Other Ambulatory Visit: Payer: Self-pay

## 2023-02-13 ENCOUNTER — Ambulatory Visit
Admission: RE | Admit: 2023-02-13 | Discharge: 2023-02-13 | Disposition: A | Discharge status: Home / Self Care | Payer: No Typology Code available for payment source | Source: Ambulatory Visit | Attending: Radiation Oncology | Admitting: Radiation Oncology

## 2023-02-13 DIAGNOSIS — Z510 Encounter for antineoplastic radiation therapy: Secondary | ICD-10-CM | POA: Diagnosis not present

## 2023-02-13 LAB — RAD ONC ARIA SESSION SUMMARY
Course Elapsed Days: 23
Plan Fractions Treated to Date: 2
Plan Prescribed Dose Per Fraction: 2 Gy
Plan Total Fractions Prescribed: 4
Plan Total Prescribed Dose: 8 Gy
Reference Point Dosage Given to Date: 4 Gy
Reference Point Session Dosage Given: 2 Gy
Session Number: 18

## 2023-02-14 ENCOUNTER — Other Ambulatory Visit: Payer: Self-pay

## 2023-02-14 ENCOUNTER — Ambulatory Visit
Admission: RE | Admit: 2023-02-14 | Discharge: 2023-02-14 | Disposition: A | Discharge status: Home / Self Care | Payer: No Typology Code available for payment source | Source: Ambulatory Visit | Attending: Radiation Oncology | Admitting: Radiation Oncology

## 2023-02-14 ENCOUNTER — Ambulatory Visit: Payer: No Typology Code available for payment source

## 2023-02-14 DIAGNOSIS — Z510 Encounter for antineoplastic radiation therapy: Secondary | ICD-10-CM | POA: Diagnosis not present

## 2023-02-14 LAB — RAD ONC ARIA SESSION SUMMARY
Course Elapsed Days: 24
Plan Fractions Treated to Date: 3
Plan Prescribed Dose Per Fraction: 2 Gy
Plan Total Fractions Prescribed: 4
Plan Total Prescribed Dose: 8 Gy
Reference Point Dosage Given to Date: 6 Gy
Reference Point Session Dosage Given: 2 Gy
Session Number: 19

## 2023-02-17 ENCOUNTER — Other Ambulatory Visit: Payer: Self-pay

## 2023-02-17 ENCOUNTER — Ambulatory Visit
Admission: RE | Admit: 2023-02-17 | Discharge: 2023-02-17 | Disposition: A | Discharge status: Home / Self Care | Payer: No Typology Code available for payment source | Source: Ambulatory Visit | Attending: Radiation Oncology

## 2023-02-17 ENCOUNTER — Ambulatory Visit: Payer: No Typology Code available for payment source

## 2023-02-17 DIAGNOSIS — Z510 Encounter for antineoplastic radiation therapy: Secondary | ICD-10-CM | POA: Diagnosis not present

## 2023-02-17 LAB — RAD ONC ARIA SESSION SUMMARY
Course Elapsed Days: 27
Plan Fractions Treated to Date: 4
Plan Prescribed Dose Per Fraction: 2 Gy
Plan Total Fractions Prescribed: 4
Plan Total Prescribed Dose: 8 Gy
Reference Point Dosage Given to Date: 8 Gy
Reference Point Session Dosage Given: 2 Gy
Session Number: 20

## 2023-02-18 ENCOUNTER — Ambulatory Visit: Payer: No Typology Code available for payment source

## 2023-02-18 ENCOUNTER — Inpatient Hospital Stay
Payer: No Typology Code available for payment source | Attending: Nurse Practitioner | Admitting: Hematology and Oncology

## 2023-02-18 VITALS — BP 129/88 | HR 69 | Temp 97.7°F | Resp 16 | Wt 182.2 lb

## 2023-02-18 DIAGNOSIS — Z79899 Other long term (current) drug therapy: Secondary | ICD-10-CM | POA: Insufficient documentation

## 2023-02-18 DIAGNOSIS — C50412 Malignant neoplasm of upper-outer quadrant of left female breast: Secondary | ICD-10-CM | POA: Diagnosis not present

## 2023-02-18 DIAGNOSIS — Z853 Personal history of malignant neoplasm of breast: Secondary | ICD-10-CM | POA: Diagnosis not present

## 2023-02-18 DIAGNOSIS — C50312 Malignant neoplasm of lower-inner quadrant of left female breast: Secondary | ICD-10-CM | POA: Insufficient documentation

## 2023-02-18 DIAGNOSIS — Z923 Personal history of irradiation: Secondary | ICD-10-CM | POA: Insufficient documentation

## 2023-02-18 DIAGNOSIS — Z79811 Long term (current) use of aromatase inhibitors: Secondary | ICD-10-CM | POA: Insufficient documentation

## 2023-02-18 DIAGNOSIS — Z170 Estrogen receptor positive status [ER+]: Secondary | ICD-10-CM | POA: Insufficient documentation

## 2023-02-18 DIAGNOSIS — Z9221 Personal history of antineoplastic chemotherapy: Secondary | ICD-10-CM | POA: Insufficient documentation

## 2023-02-18 MED ORDER — ANASTROZOLE 1 MG PO TABS: 1.0000 mg | ORAL_TABLET | Freq: Every day | ORAL | 3 refills | Status: DC

## 2023-02-18 NOTE — Progress Notes (Signed)
Patient Care Team: Maurice Small, MD as PCP - General (Family Medicine) Almond Lint, MD as Consulting Physician (General Surgery) Serena Croissant, MD as Consulting Physician (Hematology and Oncology) Dorothy Puffer, MD as Consulting Physician (Radiation Oncology) Pershing Proud, RN as Oncology Nurse Navigator Donnelly Angelica, RN as Oncology Nurse Navigator  DIAGNOSIS:  Encounter Diagnosis  Name Primary?   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  Ultrasound: 4 o'clock position: 1.1 cm solid papillary cancer (like DCIS) ER 100%, PR 15%, additionally 2 adjacent masses measuring 2 cm at 3 o'clock position on biopsy came back grade 1 IDC with micropapillary features, ER 100%, PR 100%, Ki-67 less than 1%, HER2 negative 1+   10/30/2022 Cancer Staging   Staging form: Breast, AJCC 8th Edition - Clinical stage from 10/30/2022: Stage IA (cT1b, cN0, cM0, G1, ER+, PR+, HER2-) - Signed by Ronny Bacon, PA-C on 10/30/2022 Method of lymph node assessment: Clinical Histologic grading system: 3 grade system   12/12/2022 Surgery   Left lumpectomy: 8:00: Fibrocystic change, 4:00: Grade 1 papillary carcinoma 1.5 cm, margins negative, ER 100%, PR 15%, 0/2 sentinel nodes negative, left lumpectomy 3:00: 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 female is here  because of recent diagnosis of left breast cancer. She presents to the clinic for a follow-up to start antiestrogen therapy. She reports that she did good with radiation. She denies any burns, but some mild radiation dermatitis. Her only complaint is some fatigue.  ALLERGIES:  is allergic to latex and tape.  MEDICATIONS:  Current Outpatient Medications  Medication Sig Dispense Refill   acetaminophen (TYLENOL) 500 MG tablet Take 1,000 mg by mouth every 6 (six) hours as needed for moderate pain.     anastrozole (ARIMIDEX) 1 MG tablet Take 1 tablet (1 mg total) by mouth daily. 90 tablet 3   calcium elemental as carbonate (TUMS ULTRA 1000) 400 MG chewable tablet Chew 1,000-2,000 mg by mouth daily as needed for heartburn.     diclofenac Sodium (VOLTAREN) 1 % GEL Apply 1 Application topically 4 (four) times daily as needed (pain).     diphenhydrAMINE (BENADRYL) 25 MG tablet Take 25 mg by mouth at bedtime.     ezetimibe (ZETIA) 10 MG tablet Take 10 mg by mouth daily.     FIBER PO Take 2 capsules by mouth daily.     fluticasone (FLONASE) 50 MCG/ACT nasal spray Place 1 spray into both nostrils daily as needed for allergies or rhinitis.     hydrochlorothiazide (MICROZIDE) 12.5 MG capsule Take 12.5 mg by mouth daily.     Multiple Vitamin (MULTIVITAMIN WITH MINERALS) TABS tablet Take 1 tablet by mouth daily.     Omega-3 Fatty Acids (FISH OIL) 1000 MG CAPS Take 1,000 mg by mouth daily.     omeprazole (PRILOSEC) 40 MG capsule Take 40 mg by mouth daily as  needed (acid reflux).     vitamin C (ASCORBIC ACID) 500 MG tablet Take 500 mg by mouth daily.     No current facility-administered medications for this visit.    PHYSICAL EXAMINATION: ECOG PERFORMANCE STATUS: 1 - Symptomatic but completely ambulatory  Vitals:   02/18/23 1019  BP: 129/88  Pulse: 69  Resp: 16  Temp: 97.7 F (36.5 C)  SpO2: 98%   Filed Weights   02/18/23 1019  Weight: 182 lb 3.2 oz (82.6 kg)      LABORATORY DATA:  I have  reviewed the data as listed    Latest Ref Rng & Units 12/06/2022   10:47 AM 10/30/2022   12:17 PM 04/14/2018   12:52 PM  CMP  Glucose 70 - 99 mg/dL 409  811  96   BUN 8 - 23 mg/dL 13  15  9    Creatinine 0.44 - 1.00 mg/dL 9.14  7.82  9.56   Sodium 135 - 145 mmol/L 140  140  142   Potassium 3.5 - 5.1 mmol/L 3.4  3.5  3.6   Chloride 98 - 111 mmol/L 106  103  108   CO2 22 - 32 mmol/L 26  31  25    Calcium 8.9 - 10.3 mg/dL 9.4  21.3  9.7   Total Protein 6.5 - 8.1 g/dL  7.8  7.4   Total Bilirubin 0.3 - 1.2 mg/dL  0.4  1.0   Alkaline Phos 38 - 126 U/L  59  79   AST 15 - 41 U/L  21  35   ALT 0 - 44 U/L  22  33     Lab Results  Component Value Date   WBC 5.6 12/06/2022   HGB 13.1 12/06/2022   HCT 39.2 12/06/2022   MCV 91.8 12/06/2022   PLT 286 12/06/2022   NEUTROABS 2.7 10/30/2022    ASSESSMENT & PLAN:  Primary malignant neoplasm of upper outer quadrant of breast, left (HCC) 2000: History of right breast cancer treated with lumpectomy ER/PR positive HER2 negative, adjuvant chemo (4 cycles of TC) 5 years of tamoxifen   10/18/2022: Screening mammogram detected left breast focal asymmetry left upper outer quadrant.  Ultrasound: 4 o'clock position: 1.1 cm solid papillary cancer (like DCIS) ER 100%, PR 15%, additionally 2 adjacent masses measuring 2 cm at 3 o'clock position on biopsy came back grade 1 IDC with micropapillary features, ER 100%, PR 100%, Ki-67 less than 1%, HER2 negative 1+   11/15/2022: Left breast ultrasound-guided biopsy 8:00: Grade 2 IDC   Recommendations: 1.  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 since the invasive cancer was very small there is no role of Oncotype. 3. Adjuvant radiation therapy completed 02/17/23 4. Adjuvant  antiestrogen therapy with Anastrozole 1 mg daily to start 03/01/23 ---------------------------------------------------------------------------------------------------------  Anastrozole counseling: We discussed the risks and benefits of anti-estrogen therapy with aromatase inhibitors. These include but not limited to insomnia, hot flashes, mood changes, vaginal dryness, bone density loss, and weight gain. We strongly believe that the benefits far outweigh the risks. Patient understands these risks and consented to starting treatment. Planned treatment duration is 7 years.  She gets bone density test done by her primary care physician Dr. Jacqulyn Bath RTC in 3 months for SCP visit    No  orders of the defined types were placed in this encounter.  The patient has a good understanding of the overall plan. she agrees with it. she will call with any problems that may develop before the next visit here. Total time spent: 30 mins including face to face time and time spent for planning, charting and co-ordination of care   Tamsen Meek, MD 02/18/23    I Janan Ridge am acting as a Neurosurgeon for [employer]  I have reviewed the above documentation for accuracy and completeness, and I agree with the above.

## 2023-02-18 NOTE — Assessment & Plan Note (Addendum)
2000: History of right breast cancer treated with lumpectomy ER/PR positive HER2 negative, adjuvant chemo (4 cycles of TC) 5 years of tamoxifen   10/18/2022: Screening mammogram detected left breast focal asymmetry left upper outer quadrant.  Ultrasound: 4 o'clock position: 1.1 cm solid papillary cancer (like DCIS) ER 100%, PR 15%, additionally 2 adjacent masses measuring 2 cm at 3 o'clock position on biopsy came back grade 1 IDC with micropapillary features, ER 100%, PR 100%, Ki-67 less than 1%, HER2 negative 1+   11/15/2022: Left breast ultrasound-guided biopsy 8:00: Grade 2 IDC   Recommendations: 1.  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 since the invasive cancer was very small there is no role of Oncotype. 3. Adjuvant radiation therapy completed 02/17/23 4. Adjuvant antiestrogen therapy with Anastrozole 1 mg daily to start 03/01/23 ---------------------------------------------------------------------------------------------------------  Anastrozole counseling: We discussed the risks and benefits of anti-estrogen therapy with aromatase inhibitors. These include but not limited to insomnia, hot flashes, mood changes, vaginal dryness, bone density loss, and weight gain. We strongly believe that the benefits far outweigh the risks. Patient understands these risks and consented to starting treatment. Planned treatment duration is 7 years.  She gets bone density test done by her primary care physician Dr. Jacqulyn Bath RTC in 3 months for SCP visit

## 2023-02-19 ENCOUNTER — Ambulatory Visit: Payer: No Typology Code available for payment source

## 2023-02-21 NOTE — Radiation Completion Notes (Addendum)
  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   Intent: Curative     ==========DELIVERED PLANS==========  First Treatment Date: 2023-01-21 - Last Treatment Date: 2023-02-17   Plan Name: Breast_L_BH Site: Breast, Left Technique: 3D Mode: Photon Dose Per Fraction: 2.66 Gy Prescribed Dose (Delivered / Prescribed): 42.56 Gy / 42.56 Gy Prescribed Fxs (Delivered / Prescribed): 16 / 16   Plan Name: Brst_L_Bst_BH Site: Breast, Left Technique: 3D Mode: Photon Dose Per Fraction: 2 Gy Prescribed Dose (Delivered / Prescribed): 8 Gy / 8 Gy Prescribed Fxs (Delivered / Prescribed): 4 / 4     ==========ON TREATMENT VISIT DATES========== 2023-01-24, 2023-01-31, 2023-02-07, 2023-02-14     See weekly On Treatment Notes is Epic for details. The patient tolerated radiation. She developed fatigue and anticipated skin changes in the treatment field.   The patient will receive a call in about one month from the radiation oncology department. She will continue follow up with Dr. Pamelia Hoit as well.      Osker Mason, PAC

## 2023-03-17 ENCOUNTER — Ambulatory Visit: Payer: Self-pay

## 2023-03-24 ENCOUNTER — Ambulatory Visit: Payer: No Typology Code available for payment source | Attending: General Surgery

## 2023-03-24 VITALS — Wt 182.0 lb

## 2023-03-24 DIAGNOSIS — Z483 Aftercare following surgery for neoplasm: Secondary | ICD-10-CM | POA: Insufficient documentation

## 2023-03-24 NOTE — Therapy (Signed)
OUTPATIENT PHYSICAL THERAPY SOZO SCREENING NOTE   Patient Name: [name] MRN: [id number] DOB:1961/06/09, 62 y.o., female Today's Date: 03/24/2023  PCP: Maurice Small, MD (Inactive) REFERRING PROVIDER: Almond Lint, MD   PT End of Session - 03/24/23 1515     Visit Number 2   # unchanged due to screen only   PT Start Time 0313    PT Stop Time 0317    PT Time Calculation (min) 4 min    Activity Tolerance Patient tolerated treatment well    Behavior During Therapy [facility] for tasks assessed/performed             Past Medical History:  Diagnosis Date   Breast asymmetry 08/2018   Cancer [facility]) 2000   breast right, lumpectomy, chemo and radiation   Family history of colon cancer    Family history of melanoma    GERD (gastroesophageal reflux disease)    no meds   Hypertension    Personal history of malignant neoplasm of breast 03/14/2021   PONV (postoperative nausea and vomiting)    Past Surgical History:  Procedure Laterality Date   BREAST LUMPECTOMY WITH RADIOACTIVE SEED AND SENTINEL LYMPH NODE BIOPSY Left 12/12/2022   Procedure: LEFT BREAST LUMPECTOMY WITH RADIOACTIVE SEED X3;  Surgeon: Almond Lint, MD;  Location: [facility];  Service: General;  Laterality: Left;   BREAST RECONSTRUCTION Right 07/23/2018   Procedure: RIGHT  BREAST RECONSTRUCTION;  Surgeon: Eloise Levels, MD;  Location: [facility];  Service: Plastics;  Laterality: Right;   BREAST RECONSTRUCTION Bilateral 01/31/2020   Procedure: BREAST RECONSTRUCTION;  Surgeon: Eloise Levels, MD;  Location: [facility];  Service: Plastics;  Laterality: Bilateral;   BREAST RECONSTRUCTION Left 06/26/2021   Procedure: BREAST RECONSTRUCTION WITH FAT TRANSFER;  Surgeon: Eloise Levels, MD;  Location: [facility];  Service: Plastics;  Laterality: Left;   BREAST SURGERY Right 2000   lumpectomy, sentinel node removed   CESAREAN SECTION     x 1   COLONOSCOPY WITH  PROPOFOL N/A 05/21/2016   Procedure: COLONOSCOPY WITH PROPOFOL;  Surgeon: Charolett Bumpers, MD;  Location: [facility];  Service: Endoscopy;  Laterality: N/A;   LIPOSUCTION WITH LIPOFILLING Bilateral 07/23/2018   Procedure: LIPOSUCTION AND FAT GRAFTING;  Surgeon: Contogiannis, Chales Abrahams, MD;  Location: [facility];  Service: Plastics;  Laterality: Bilateral;  TO RIGHT BREAST   LIPOSUCTION WITH LIPOFILLING Right 09/28/2018   Procedure: ABDOMEN, HIPS AND FLANKS LIPOSUCTION WITH MICRO FAT GRAFTING TO RIGHT BREAST;  Surgeon: Eloise Levels, MD;  Location: [facility];  Service: Plastics;  Laterality: Right;   LIPOSUCTION WITH LIPOFILLING Right 03/02/2019   Procedure: LIPOSUCTION OF ABDOMEN HIPS AND FLANKS WITH FAT GRAFTING TO RIGHT BREAT;  Surgeon: Contogiannis, Chales Abrahams, MD;  Location: [facility];  Service: Plastics;  Laterality: Right;   LIPOSUCTION WITH LIPOFILLING Bilateral 01/31/2020   Procedure: LIPOSUCTION WITH LIPOFILLING;  Surgeon: Contogiannis, Chales Abrahams, MD;  Location: [facility];  Service: Plastics;  Laterality: Bilateral;   LIPOSUCTION WITH LIPOFILLING Left 07/18/2020   Procedure: MICRO FAT GRAFTING USING LIPOSUCTION FROM ABDOMEN AND BILATERAL FLANKS;  Surgeon: Contogiannis, Chales Abrahams, MD;  Location: [facility];  Service: Plastics;  Laterality: Left;   LIPOSUCTION WITH LIPOFILLING Left 06/26/2021   Procedure: LIPOSUCTION WITH LIPOFILLING;  Surgeon: Contogiannis, Chales Abrahams, MD;  Location: [facility];  Service: Plastics;  Laterality: Left;   MASTECTOMY W/ SENTINEL NODE BIOPSY Left 12/12/2022  Procedure: SENTINEL LYMPH NODE BIOPSY;  Surgeon: Almond Lint, MD;  Location: [facility];  Service: General;  Laterality: Left;   MASTOPEXY Left 07/13/2019   Procedure: LEFT BREAST MASTOPEXY;  Surgeon: Eloise Levels, MD;  Location: [facility];  Service: Plastics;  Laterality: Left;   TUBAL LIGATION      Patient Active Problem List   Diagnosis Date Noted   Primary malignant neoplasm of upper outer quadrant of breast, left (HCC) 10/28/2022   Genetic testing 04/03/2021   Family history of colon cancer 03/14/2021   Family history of melanoma 03/14/2021   Personal history of malignant neoplasm of breast 03/14/2021    REFERRING DIAG: left breast cancer at risk for lymphedema  THERAPY DIAG:  Aftercare following surgery for neoplasm  PERTINENT HISTORY: Patient was diagnosed on 10/03/2022 with left grade 1 invasive ductal carcinoma breast cancer. She underwent a left lumpectomy and sentinel node biopsy (2 negative nodes) on 12/12/2022. It is ER/PR positive and HER2 negative with a Ki67 of <1%. She had right breast cancer in 2000 with a right lumpectomy and sentinel node biopsy (2 negative nodes removed), chemotherapy and radiation. No hx right arm lymphedema.   PRECAUTIONS: left UE Lymphedema risk, None  SUBJECTIVE: Pt returns for her first 3 month L-Dex screen.   PAIN:  Are you having pain? No  SOZO SCREENING: Patient was assessed today using the SOZO machine to determine the lymphedema index score. This was compared to her baseline score. It was determined that she is within the recommended range when compared to her baseline and no further action is needed at this time. She will continue SOZO screenings. These are done every 3 months for 2 years post operatively followed by every 6 months for 2 years, and then annually.   L-DEX FLOWSHEETS - 03/24/23 1500       L-DEX LYMPHEDEMA SCREENING   Measurement Type Unilateral    L-DEX MEASUREMENT EXTREMITY Upper Extremity    POSITION  Standing    DOMINANT SIDE Right    At Risk Side Left    BASELINE SCORE (UNILATERAL) -0.9    L-DEX SCORE (UNILATERAL) 2.8    VALUE CHANGE (UNILAT) 3.7               Hermenia Bers, PTA 03/24/2023, 3:18 PM

## 2023-04-28 ENCOUNTER — Ambulatory Visit
Admission: RE | Admit: 2023-04-28 | Discharge: 2023-04-28 | Disposition: A | Discharge status: Home / Self Care | Payer: No Typology Code available for payment source | Source: Ambulatory Visit | Attending: Adult Health | Admitting: Adult Health

## 2023-04-28 NOTE — Progress Notes (Signed)
  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   The patient was available for call today.   Symptoms of fatigue have improved since completing therapy.  Symptoms of skin changes have improved since completing therapy.  The patient was encouraged to avoid sun exposure in the area of prior treatment for up to one year following radiation with either sunscreen or by the style of clothing worn in the sun.  The patient has scheduled follow up with her medical oncologist Dr. Pamelia Hoit for ongoing surveillance, and was encouraged to call if she develops concerns or questions regarding radiation.   This concludes the interaction.  Ruel Favors, LPN

## 2023-05-13 ENCOUNTER — Telehealth: Payer: Self-pay | Admitting: *Deleted

## 2023-05-13 ENCOUNTER — Encounter: Payer: Self-pay | Admitting: Adult Health

## 2023-05-13 ENCOUNTER — Inpatient Hospital Stay: Payer: No Typology Code available for payment source | Attending: Nurse Practitioner | Admitting: Adult Health

## 2023-05-13 ENCOUNTER — Other Ambulatory Visit: Payer: Self-pay

## 2023-05-13 ENCOUNTER — Encounter: Payer: No Typology Code available for payment source | Admitting: Adult Health

## 2023-05-13 VITALS — BP 145/83 | HR 65 | Temp 98.3°F | Wt 177.9 lb

## 2023-05-13 DIAGNOSIS — Z800 Family history of malignant neoplasm of digestive organs: Secondary | ICD-10-CM | POA: Diagnosis not present

## 2023-05-13 DIAGNOSIS — Z801 Family history of malignant neoplasm of trachea, bronchus and lung: Secondary | ICD-10-CM | POA: Insufficient documentation

## 2023-05-13 DIAGNOSIS — I10 Essential (primary) hypertension: Secondary | ICD-10-CM | POA: Insufficient documentation

## 2023-05-13 DIAGNOSIS — Z79899 Other long term (current) drug therapy: Secondary | ICD-10-CM | POA: Insufficient documentation

## 2023-05-13 DIAGNOSIS — Z87891 Personal history of nicotine dependence: Secondary | ICD-10-CM | POA: Diagnosis not present

## 2023-05-13 DIAGNOSIS — C50412 Malignant neoplasm of upper-outer quadrant of left female breast: Secondary | ICD-10-CM | POA: Insufficient documentation

## 2023-05-13 DIAGNOSIS — Z923 Personal history of irradiation: Secondary | ICD-10-CM | POA: Diagnosis not present

## 2023-05-13 DIAGNOSIS — Z809 Family history of malignant neoplasm, unspecified: Secondary | ICD-10-CM | POA: Diagnosis not present

## 2023-05-13 DIAGNOSIS — Z170 Estrogen receptor positive status [ER+]: Secondary | ICD-10-CM | POA: Diagnosis not present

## 2023-05-13 NOTE — Telephone Encounter (Signed)
This RN called Shanda Bumps at [facility] - obtained identified VM- detailed message left requesting most recent bone density with pt demographics to be faxed to this RN.

## 2023-05-13 NOTE — Telephone Encounter (Signed)
Per visit today pt asked for prescription for Radioplex - informed her this dispensed per rad onc during radiation treatment.  She states her breast still has skin discomfort and per the lady at [facility] she should continue to use this cream.  This RN informed her Mardella Layman is not as familiar with ordering this - and it may not be covered by her insurance- as you can obtain it OTC via [employer] ( this RN printed information per this ).  She is hoping she could have a prescription to see if it would be paid by her insurance benefits.  This RN informed her request would be sent to Rad Onc provider for appropriate follow up.

## 2023-05-13 NOTE — Progress Notes (Signed)
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  Ultrasound: 4 o'clock position: 1.1 cm solid papillary cancer (like DCIS) ER 100%, PR 15%, additionally 2 adjacent masses measuring 2 cm at 3 o'clock position on biopsy came back grade 1 IDC with micropapillary features, ER 100%, PR 100%, Ki-67 less than 1%, HER2 negative 1+   10/30/2022 Cancer Staging   Staging form: Breast, AJCC 8th Edition - Clinical stage from 10/30/2022: Stage IA (cT1b, cN0, cM0, G1, ER+, PR+, HER2-) - Signed by Ronny Bacon, PA-C on 10/30/2022 Method of lymph node assessment: Clinical Histologic grading system: 3 grade system   12/12/2022 Surgery   Left lumpectomy: 8:00: Fibrocystic change, 4:00: Grade 1 papillary carcinoma 1.5 cm, margins negative, ER 100%, PR 15%, 0/2 sentinel nodes negative, left lumpectomy 3:00: 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 to review her survivorship care plan detailing her treatment course for breast cancer, as well as monitoring long-term side effects of that treatment, education regarding health maintenance, screening, and overall wellness and health promotion.     Overall, [name] reports feeling quite well.  She is taking anastrozole with good tolerance.  She denies any significant side effects.   REVIEW OF SYSTEMS:  Review of Systems  Constitutional:  Negative for appetite change, chills, fatigue, fever and  unexpected weight change.  HENT:   Negative for hearing loss, lump/mass and trouble swallowing.   Eyes:  Negative for eye problems and icterus.  Respiratory:  Negative for chest tightness, cough and shortness of breath.   Cardiovascular:  Negative for chest pain, leg swelling and palpitations.  Gastrointestinal:  Negative for abdominal distention, abdominal pain, constipation, diarrhea, nausea and vomiting.  Endocrine: Negative for hot flashes.  Genitourinary:  Negative for difficulty urinating.   Musculoskeletal:  Negative for arthralgias.  Skin:  Negative for itching and rash.  Neurological:  Negative for dizziness, extremity weakness, headaches and numbness.  Hematological:  Negative for adenopathy. Does not bruise/bleed easily.  Psychiatric/Behavioral:  Negative for depression. The patient is not nervous/anxious.   Breast: Denies any new nodularity, masses, tenderness, nipple changes, or nipple discharge.       PAST MEDICAL/SURGICAL HISTORY:  Past Medical History:  Diagnosis Date   Breast asymmetry 08/2018   Cancer [facility]) 2000   breast right, lumpectomy, chemo and radiation   Family history of colon cancer    Family history of melanoma    GERD (gastroesophageal reflux disease)    no meds   Hypertension    Personal history of malignant neoplasm of breast 03/14/2021   PONV (postoperative nausea and vomiting)    Past Surgical History:  Procedure Laterality Date   BREAST LUMPECTOMY WITH RADIOACTIVE SEED AND SENTINEL LYMPH NODE BIOPSY Left 12/12/2022   Procedure: LEFT BREAST LUMPECTOMY WITH RADIOACTIVE SEED X3;  Surgeon: Almond Lint, MD;  [facility];  Service: General;  Laterality: Left;   BREAST RECONSTRUCTION Right 07/23/2018   Procedure: RIGHT  BREAST RECONSTRUCTION;  Surgeon: Eloise Levels, MD;  Location: [facility];  Service: Plastics;  Laterality: Right;   BREAST RECONSTRUCTION Bilateral 01/31/2020   Procedure: BREAST RECONSTRUCTION;  Surgeon:  Eloise Levels, MD;  Location: [facility];  Service: Plastics;  Laterality: Bilateral;   BREAST RECONSTRUCTION Left 06/26/2021   Procedure: BREAST RECONSTRUCTION WITH FAT TRANSFER;  Surgeon: Eloise Levels, MD;  Location: [facility];  Service: Plastics;  Laterality: Left;   BREAST SURGERY Right 2000   lumpectomy, sentinel node removed   CESAREAN SECTION     x 1   COLONOSCOPY WITH PROPOFOL N/A 05/21/2016   Procedure: COLONOSCOPY WITH PROPOFOL;  Surgeon: Charolett Bumpers, MD;  Location: [facility];  Service: Endoscopy;  Laterality: N/A;   LIPOSUCTION WITH LIPOFILLING Bilateral 07/23/2018   Procedure: LIPOSUCTION AND FAT GRAFTING;  Surgeon: Contogiannis, Chales Abrahams, MD;  Location: [facility];  Service: Plastics;  Laterality: Bilateral;  TO RIGHT BREAST   LIPOSUCTION WITH LIPOFILLING Right 09/28/2018   Procedure: ABDOMEN, HIPS AND FLANKS LIPOSUCTION WITH MICRO FAT GRAFTING TO RIGHT BREAST;  Surgeon: Eloise Levels, MD;  Location: [facility];  Service: Plastics;  Laterality: Right;   LIPOSUCTION WITH LIPOFILLING Right 03/02/2019   Procedure: LIPOSUCTION OF ABDOMEN HIPS AND FLANKS WITH FAT GRAFTING TO RIGHT BREAT;  Surgeon: Contogiannis, Chales Abrahams, MD;  Location: [facility];  Service: Plastics;  Laterality: Right;   LIPOSUCTION WITH LIPOFILLING Bilateral 01/31/2020   Procedure: LIPOSUCTION WITH LIPOFILLING;  Surgeon: Contogiannis, Chales Abrahams, MD;  Location: [facility];  Service: Plastics;  Laterality: Bilateral;   LIPOSUCTION WITH LIPOFILLING Left 07/18/2020   Procedure: MICRO FAT GRAFTING USING LIPOSUCTION FROM ABDOMEN AND BILATERAL FLANKS;  Surgeon: Contogiannis, Chales Abrahams, MD;  Location: [facility];  Service: Plastics;  Laterality: Left;   LIPOSUCTION WITH LIPOFILLING Left 06/26/2021   Procedure: LIPOSUCTION WITH LIPOFILLING;  Surgeon: Contogiannis, Chales Abrahams, MD;  Location: [facility];  Service: Plastics;  Laterality: Left;   MASTECTOMY W/ SENTINEL NODE BIOPSY Left 12/12/2022   Procedure: SENTINEL LYMPH NODE BIOPSY;  Surgeon: Almond Lint, MD;  [facility];  Service: General;  Laterality: Left;   MASTOPEXY Left 07/13/2019   Procedure: LEFT BREAST MASTOPEXY;  Surgeon: Eloise Levels, MD;  Location: [facility];  Service: Plastics;  Laterality: Left;   TUBAL LIGATION       ALLERGIES:  Allergies  Allergen Reactions   Latex Rash   Tape Rash     CURRENT MEDICATIONS:  Outpatient Encounter Medications as of 05/13/2023  Medication Sig   acetaminophen (TYLENOL) 500 MG tablet Take 1,000 mg by mouth every 6 (six) hours as needed for moderate pain.   anastrozole (ARIMIDEX) 1 MG tablet Take 1 tablet (1 mg total) by mouth daily.   calcium elemental as carbonate (TUMS ULTRA 1000) 400 MG chewable tablet Chew 1,000-2,000 mg by mouth daily as needed for heartburn.   diclofenac Sodium (VOLTAREN) 1 % GEL Apply 1 Application topically 4 (four) times daily as needed (pain).   diphenhydrAMINE (BENADRYL) 25 MG tablet Take 25 mg by mouth at bedtime.   ezetimibe (ZETIA) 10 MG tablet Take 10 mg by mouth daily.   FIBER PO Take 2 capsules by mouth daily.   fluticasone (FLONASE) 50 MCG/ACT nasal spray Place 1 spray into both nostrils daily as needed for allergies or rhinitis.   hydrochlorothiazide (MICROZIDE) 12.5 MG capsule Take 12.5 mg by mouth daily.  Multiple Vitamin (MULTIVITAMIN WITH MINERALS) TABS tablet Take 1 tablet by mouth daily.   Omega-3 Fatty Acids (FISH OIL) 1000 MG CAPS Take 1,000 mg by mouth daily.   omeprazole (PRILOSEC) 40 MG capsule Take 40 mg by mouth daily as needed (acid reflux).   vitamin C (ASCORBIC ACID) 500 MG tablet Take 500 mg by mouth daily.   No facility-administered encounter medications on file as of 05/13/2023.     ONCOLOGIC FAMILY HISTORY:  Family History  Problem Relation Age of Onset   Colon cancer Mother         dx in her 50s   Alcohol abuse Father    Lung cancer Maternal Aunt    Cancer Maternal Uncle        d. 45s   Cancer Paternal Aunt        NOS   Colon cancer Maternal Grandmother        dx in her 33s   Cancer Maternal Grandfather        NOS   Alcohol abuse Paternal Grandfather    Melanoma Half-Sister        dx in her 61s     SOCIAL HISTORY:  Social History   Socioeconomic History   Marital status: Divorced    Spouse name: Not on file   Number of children: Not on file   Years of education: Not on file   Highest education level: Not on file  Occupational History   Not on file  Tobacco Use   Smoking status: Former    Current packs/day: 0.00    Average packs/day: 1 pack/day for 10.0 years (10.0 ttl pk-yrs)    Types: Cigarettes    Start date: 09/30/1976    Quit date: 09/30/1986    Years since quitting: 36.6   Smokeless tobacco: Never  Vaping Use   Vaping status: Never Used  Substance and Sexual Activity   Alcohol use: Yes    Comment: occ   Drug use: Never   Sexual activity: Yes    Birth control/protection: Post-menopausal    Comment: BTL  Other Topics Concern   Not on file  Social History Narrative   Not on file   Social Determinants of Health   Financial Resource Strain: Not on file  Food Insecurity: No Food Insecurity (01/07/2023)   Hunger Vital Sign    Worried About Running Out of Food in the Last Year: Never true    Ran Out of Food in the Last Year: Never true  Transportation Needs: No Transportation Needs (01/07/2023)   PRAPARE - Administrator, Civil Service (Medical): No    Lack of Transportation (Non-Medical): No  Physical Activity: Not on file  Stress: Not on file  Social Connections: Not on file  Intimate Partner Violence: Not At Risk (01/07/2023)   Humiliation, Afraid, Rape, and Kick questionnaire    Fear of Current or Ex-Partner: No    Emotionally Abused: No    Physically Abused: No    Sexually Abused: No      OBSERVATIONS/OBJECTIVE:  BP (!) 145/83 (BP Location: Left Wrist, Patient Position: Sitting)   Pulse 65   Temp 98.3 F (36.8 C) (Temporal)   Wt 177 lb 14.4 oz (80.7 kg)   SpO2 100%   BMI 28.71 kg/m  GENERAL: Patient is a well appearing female in no acute distress HEENT:  Sclerae anicteric.  Oropharynx clear and moist. No ulcerations or evidence of oropharyngeal candidiasis. Neck is supple.  NODES:  No cervical, supraclavicular,  or axillary lymphadenopathy palpated.  BREAST EXAM: Left breast status postlumpectomy and radiation no sign of local recurrence right breast is benign. LUNGS:  Clear to auscultation bilaterally.  No wheezes or rhonchi. HEART:  Regular rate and rhythm. No murmur appreciated. ABDOMEN:  Soft, nontender.  Positive, normoactive bowel sounds. No organomegaly palpated. MSK:  No focal spinal tenderness to palpation. Full range of motion bilaterally in the upper extremities. EXTREMITIES:  No peripheral edema.   SKIN:  Clear with no obvious rashes or skin changes. No nail dyscrasia. NEURO:  Nonfocal. Well oriented.  Appropriate affect.   LABORATORY DATA:  None for this visit.  DIAGNOSTIC IMAGING:  None for this visit.      ASSESSMENT AND PLAN:  Ms.. [name] is a pleasant 62 y.o. female with Stage IA left breast invasive ductal carcinoma, ER+/PR+/HER2-, diagnosed in 09/2022, treated with lumpectomy, adjuvant radiation therapy, and anti-estrogen therapy with Anastrozole beginning in 03/2023.  She presents to the Survivorship Clinic for our initial meeting and routine follow-up post-completion of treatment for breast cancer.    1. Stage IA left breast cancer:  [name] is continuing to recover from definitive treatment for breast cancer. She will follow-up with her medical oncologist, Dr.  Pamelia Hoit in 6 months with history and physical exam per surveillance protocol.  She will continue her anti-estrogen therapy with Anastrozole. Thus far, she is tolerating the  Anastrozole well, with minimal side effects. Her mammogram is due 11/2023; orders placed today.   Today, a comprehensive survivorship care plan and treatment summary was reviewed with the patient today detailing her breast cancer diagnosis, treatment course, potential late/long-term effects of treatment, appropriate follow-up care with recommendations for the future, and patient education resources.  A copy of this summary, along with a letter will be sent to the patient's primary care provider via mail/fax/In Basket message after today's visit.    2. Bone health:  Given [name]'s age/history of breast cancer and her current treatment regimen including anti-estrogen therapy with Anastrozole, she is at risk for bone demineralization.  He underwent bone density testing last year but it was completed at [facility].  I asked my nurse to get these results for me.  She was given education on specific activities to promote bone health.  3. Cancer screening:  Due to [name]'s history and her age, she should receive screening for skin cancers, colon cancer, and gynecologic cancers. The information and recommendations are listed on the patient's comprehensive care plan/treatment summary and were reviewed in detail with the patient.    4. Health maintenance and wellness promotion: [name] was encouraged to consume 5-7 servings of fruits and vegetables per day. We reviewed the "Nutrition Rainbow" handout.  She was also encouraged to engage in moderate to vigorous exercise for 30 minutes per day most days of the week.  She was instructed to limit her alcohol consumption and continue to abstain from tobacco use.     5. Support services/counseling: It is not uncommon for this period of the patient's cancer care trajectory to be one of many emotions and stressors. She was given information regarding our available services and encouraged to contact me with any questions or for help enrolling in any of our support  group/programs.    Follow up instructions:    -Return to cancer center in 6 months for f/u with Dr. Pamelia Hoit  -Mammogram due in 11/2023 -She is welcome to return back to the Survivorship Clinic at any time; no additional follow-up needed at this  time.  -Consider referral back to survivorship as a long-term survivor for continued surveillance  The patient was provided an opportunity to ask questions and all were answered. The patient agreed with the plan and demonstrated an understanding of the instructions.   Total encounter time:45 minutes*in face-to-face visit time, chart review, lab review, care coordination, order entry, and documentation of the encounter time.    Lillard Anes, NP 05/13/23 3:49 PM Medical Oncology and Hematology [facility] [address] [place], [place] [address] Tel. [phone]    Fax. [phone]  *Total Encounter Time as defined by the Centers for Medicare and Medicaid Services includes, in addition to the face-to-face time of a patient visit (documented in the note above) non-face-to-face time: obtaining and reviewing outside history, ordering and reviewing medications, tests or procedures, care coordination (communications with other health care professionals or caregivers) and documentation in the medical record.

## 2023-05-14 ENCOUNTER — Telehealth: Payer: Self-pay | Admitting: Adult Health

## 2023-05-14 NOTE — Telephone Encounter (Signed)
Scheduled appointment per 8/13 los. Patient is aware of the made appointment.

## 2023-05-28 ENCOUNTER — Other Ambulatory Visit: Payer: Self-pay | Admitting: Adult Health

## 2023-05-28 DIAGNOSIS — C50412 Malignant neoplasm of upper-outer quadrant of left female breast: Secondary | ICD-10-CM

## 2023-05-28 NOTE — Progress Notes (Signed)
Per Lillard Anes, DNP, called pt to make aware of bone density scan. Pt was has mild osteopenia and advised to redo bone density scan in 08/2023. Verbalized understanding.

## 2023-05-30 ENCOUNTER — Encounter: Payer: Self-pay | Admitting: Family Medicine

## 2023-06-23 ENCOUNTER — Ambulatory Visit: Payer: No Typology Code available for payment source | Attending: General Surgery

## 2023-06-23 VITALS — Wt 178.0 lb

## 2023-06-23 DIAGNOSIS — Z483 Aftercare following surgery for neoplasm: Secondary | ICD-10-CM | POA: Insufficient documentation

## 2023-06-23 NOTE — Therapy (Signed)
OUTPATIENT PHYSICAL THERAPY SOZO SCREENING NOTE   Patient Name: [name] MRN: [id number] DOB:Aug 05, 1961, 62 y.o., female Today's Date: 06/23/2023  PCP: No primary care provider on file. REFERRING PROVIDER: Almond Lint, MD   PT End of Session - 06/23/23 1629     Visit Number 2   # unchanged due to screen only   PT Start Time 1527    PT Stop Time 1531    PT Time Calculation (min) 4 min    Activity Tolerance Patient tolerated treatment well    Behavior During Therapy [facility] for tasks assessed/performed             Past Medical History:  Diagnosis Date   Breast asymmetry 08/2018   Cancer [facility]) 2000   breast right, lumpectomy, chemo and radiation   Family history of colon cancer    Family history of melanoma    GERD (gastroesophageal reflux disease)    no meds   Hypertension    Personal history of malignant neoplasm of breast 03/14/2021   PONV (postoperative nausea and vomiting)    Past Surgical History:  Procedure Laterality Date   BREAST LUMPECTOMY WITH RADIOACTIVE SEED AND SENTINEL LYMPH NODE BIOPSY Left 12/12/2022   Procedure: LEFT BREAST LUMPECTOMY WITH RADIOACTIVE SEED X3;  Surgeon: Almond Lint, MD;  [facility];  Service: General;  Laterality: Left;   BREAST RECONSTRUCTION Right 07/23/2018   Procedure: RIGHT  BREAST RECONSTRUCTION;  Surgeon: Eloise Levels, MD;  Location: [facility];  Service: Plastics;  Laterality: Right;   BREAST RECONSTRUCTION Bilateral 01/31/2020   Procedure: BREAST RECONSTRUCTION;  Surgeon: Eloise Levels, MD;  Location: [facility];  Service: Plastics;  Laterality: Bilateral;   BREAST RECONSTRUCTION Left 06/26/2021   Procedure: BREAST RECONSTRUCTION WITH FAT TRANSFER;  Surgeon: Eloise Levels, MD;  Location: [facility];  Service: Plastics;  Laterality: Left;   BREAST SURGERY Right 2000   lumpectomy, sentinel node removed   CESAREAN SECTION     x 1   COLONOSCOPY WITH  PROPOFOL N/A 05/21/2016   Procedure: COLONOSCOPY WITH PROPOFOL;  Surgeon: Charolett Bumpers, MD;  [facility];  Service: Endoscopy;  Laterality: N/A;   LIPOSUCTION WITH LIPOFILLING Bilateral 07/23/2018   Procedure: LIPOSUCTION AND FAT GRAFTING;  Surgeon: Contogiannis, Chales Abrahams, MD;  Location: [facility];  Service: Plastics;  Laterality: Bilateral;  TO RIGHT BREAST   LIPOSUCTION WITH LIPOFILLING Right 09/28/2018   Procedure: ABDOMEN, HIPS AND FLANKS LIPOSUCTION WITH MICRO FAT GRAFTING TO RIGHT BREAST;  Surgeon: Eloise Levels, MD;  Location: [facility];  Service: Plastics;  Laterality: Right;   LIPOSUCTION WITH LIPOFILLING Right 03/02/2019   Procedure: LIPOSUCTION OF ABDOMEN HIPS AND FLANKS WITH FAT GRAFTING TO RIGHT BREAT;  Surgeon: Contogiannis, Chales Abrahams, MD;  Location: [facility];  Service: Plastics;  Laterality: Right;   LIPOSUCTION WITH LIPOFILLING Bilateral 01/31/2020   Procedure: LIPOSUCTION WITH LIPOFILLING;  Surgeon: Contogiannis, Chales Abrahams, MD;  Location: [facility];  Service: Plastics;  Laterality: Bilateral;   LIPOSUCTION WITH LIPOFILLING Left 07/18/2020   Procedure: MICRO FAT GRAFTING USING LIPOSUCTION FROM ABDOMEN AND BILATERAL FLANKS;  Surgeon: Contogiannis, Chales Abrahams, MD;  Location: [facility];  Service: Plastics;  Laterality: Left;   LIPOSUCTION WITH LIPOFILLING Left 06/26/2021   Procedure: LIPOSUCTION WITH LIPOFILLING;  Surgeon: Contogiannis, Chales Abrahams, MD;  Location: [facility];  Service: Plastics;  Laterality: Left;   MASTECTOMY W/ SENTINEL NODE BIOPSY Left  12/12/2022   Procedure: SENTINEL LYMPH NODE BIOPSY;  Surgeon: Almond Lint, MD;  [facility];  Service: General;  Laterality: Left;   MASTOPEXY Left 07/13/2019   Procedure: LEFT BREAST MASTOPEXY;  Surgeon: Eloise Levels, MD;  Location: [facility];  Service: Plastics;  Laterality: Left;   TUBAL LIGATION      Patient Active Problem List   Diagnosis Date Noted   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 She underwent a left lumpectomy and sentinel node biopsy (2 negative nodes) on 12/12/2022. It is ER/PR positive and HER2 negative with a Ki67 of <1%. She had right breast cancer in 2000 with a right lumpectomy and sentinel node biopsy (2 negative nodes removed), chemotherapy and radiation. No hx right arm lymphedema.   PRECAUTIONS: left UE Lymphedema risk, None  SUBJECTIVE: Pt returns for her 3 month L-Dex screen.   PAIN:  Are you having pain? No  SOZO SCREENING: Patient was assessed today using the SOZO machine to determine the lymphedema index score. This was compared to her baseline score. It was determined that she is within the recommended range when compared to her baseline and no further action is needed at this time. She will continue SOZO screenings. These are done every 3 months for 2 years post operatively followed by every 6 months for 2 years, and then annually.   L-DEX FLOWSHEETS - 06/23/23 1600       L-DEX LYMPHEDEMA SCREENING   Measurement Type Unilateral    L-DEX MEASUREMENT EXTREMITY Upper Extremity    POSITION  Standing    DOMINANT SIDE Right    At Risk Side Left    BASELINE SCORE (UNILATERAL) -0.9    L-DEX SCORE (UNILATERAL) -0.1    VALUE CHANGE (UNILAT) 0.8               Hermenia Bers, PTA 06/23/2023, 4:30 PM

## 2023-10-13 ENCOUNTER — Ambulatory Visit: Payer: No Typology Code available for payment source | Attending: General Surgery

## 2023-10-13 VITALS — Wt 180.0 lb

## 2023-10-13 DIAGNOSIS — Z483 Aftercare following surgery for neoplasm: Secondary | ICD-10-CM | POA: Insufficient documentation

## 2023-10-13 NOTE — Therapy (Signed)
 OUTPATIENT PHYSICAL THERAPY SOZO SCREENING NOTE   Patient Name: [name] MRN: [id number] DOB:Aug 07, 1961, 63 y.o., female Today's Date: 10/13/2023  PCP: No primary care provider on file. REFERRING PROVIDER: [name], MD   PT End of Session - 10/13/23 1634     Visit Number 2   # unchanged due to screen only   PT Start Time 1632    PT Stop Time 1636    PT Time Calculation (min) 4 min    Activity Tolerance Patient tolerated treatment well    Behavior During Therapy [facility] for tasks assessed/performed             Past Medical History:  Diagnosis Date   Breast asymmetry 08/2018   Cancer [facility]) 2000   breast right, lumpectomy, chemo and radiation   Family history of colon cancer    Family history of melanoma    GERD (gastroesophageal reflux disease)    no meds   Hypertension    Personal history of malignant neoplasm of breast 03/14/2021   PONV (postoperative nausea and vomiting)    Past Surgical History:  Procedure Laterality Date   BREAST LUMPECTOMY WITH RADIOACTIVE SEED AND SENTINEL LYMPH NODE BIOPSY Left 12/12/2022   Procedure: LEFT BREAST LUMPECTOMY WITH RADIOACTIVE SEED X3;  Surgeon: [name], MD;  Location: [facility];  Service: General;  Laterality: Left;   BREAST RECONSTRUCTION Right 07/23/2018   Procedure: RIGHT  BREAST RECONSTRUCTION;  Surgeon: Creola Ronal Caldron, MD;  Location: [facility];  Service: Plastics;  Laterality: Right;   BREAST RECONSTRUCTION Bilateral 01/31/2020   Procedure: BREAST RECONSTRUCTION;  Surgeon: Creola Ronal Caldron, MD;  Location: [facility];  Service: Plastics;  Laterality: Bilateral;   BREAST RECONSTRUCTION Left 06/26/2021   Procedure: BREAST RECONSTRUCTION WITH FAT TRANSFER;  Surgeon: Creola Ronal Caldron, MD;  Location: [facility];  Service: Plastics;  Laterality: Left;   BREAST SURGERY Right 2000   lumpectomy, sentinel node removed   CESAREAN SECTION     x 1   COLONOSCOPY WITH  PROPOFOL  N/A 05/21/2016   Procedure: COLONOSCOPY WITH PROPOFOL ;  Surgeon: Gladis MARLA Louder, MD;  Location: [facility];  Service: Endoscopy;  Laterality: N/A;   LIPOSUCTION WITH LIPOFILLING Bilateral 07/23/2018   Procedure: LIPOSUCTION AND FAT GRAFTING;  Surgeon: Contogiannis, Ronal Caldron, MD;  Location: [facility];  Service: Plastics;  Laterality: Bilateral;  TO RIGHT BREAST   LIPOSUCTION WITH LIPOFILLING Right 09/28/2018   Procedure: ABDOMEN, HIPS AND FLANKS LIPOSUCTION WITH MICRO FAT GRAFTING TO RIGHT BREAST;  Surgeon: Creola Ronal Caldron, MD;  Location: [facility];  Service: Plastics;  Laterality: Right;   LIPOSUCTION WITH LIPOFILLING Right 03/02/2019   Procedure: LIPOSUCTION OF ABDOMEN HIPS AND FLANKS WITH FAT GRAFTING TO RIGHT BREAT;  Surgeon: Contogiannis, Ronal Caldron, MD;  Location: [facility];  Service: Plastics;  Laterality: Right;   LIPOSUCTION WITH LIPOFILLING Bilateral 01/31/2020   Procedure: LIPOSUCTION WITH LIPOFILLING;  Surgeon: Contogiannis, Ronal Caldron, MD;  Location: [facility];  Service: Plastics;  Laterality: Bilateral;   LIPOSUCTION WITH LIPOFILLING Left 07/18/2020   Procedure: MICRO FAT GRAFTING USING LIPOSUCTION FROM ABDOMEN AND BILATERAL FLANKS;  Surgeon: Contogiannis, Ronal Caldron, MD;  Location: [facility];  Service: Plastics;  Laterality: Left;   LIPOSUCTION WITH LIPOFILLING Left 06/26/2021   Procedure: LIPOSUCTION WITH LIPOFILLING;  Surgeon: Contogiannis, Ronal Caldron, MD;  Location: [facility];  Service: Plastics;  Laterality: Left;   MASTECTOMY W/ SENTINEL NODE BIOPSY Left  12/12/2022   Procedure: SENTINEL LYMPH NODE BIOPSY;  Surgeon: [name], MD;  Location: [facility];  Service: General;  Laterality: Left;   MASTOPEXY Left 07/13/2019   Procedure: LEFT BREAST MASTOPEXY;  Surgeon: Creola Ronal Caldron, MD;  Location: [facility];  Service: Plastics;  Laterality: Left;   TUBAL LIGATION      Patient Active Problem List   Diagnosis Date Noted   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 She underwent a left lumpectomy and sentinel node biopsy (2 negative nodes) on 12/12/2022. It is ER/PR positive and HER2 negative with a Ki67 of <1%. She had right breast cancer in 2000 with a right lumpectomy and sentinel node biopsy (2 negative nodes removed), chemotherapy and radiation. No hx right arm lymphedema.   PRECAUTIONS: left UE Lymphedema risk, None  SUBJECTIVE: Pt returns for her 3 month L-Dex screen.   PAIN:  Are you having pain? No  SOZO SCREENING: Patient was assessed today using the SOZO machine to determine the lymphedema index score. This was compared to her baseline score. It was determined that she is within the recommended range when compared to her baseline and no further action is needed at this time. She will continue SOZO screenings. These are done every 3 months for 2 years post operatively followed by every 6 months for 2 years, and then annually.   L-DEX FLOWSHEETS - 10/13/23 1600       L-DEX LYMPHEDEMA SCREENING   Measurement Type Unilateral    L-DEX MEASUREMENT EXTREMITY Upper Extremity    POSITION  Standing    DOMINANT SIDE Right    At Risk Side Left    BASELINE SCORE (UNILATERAL) -0.9    L-DEX SCORE (UNILATERAL) 2.3    VALUE CHANGE (UNILAT) 3.2               Aden Berwyn Caldron, PTA 10/13/2023, 4:35 PM

## 2023-11-13 ENCOUNTER — Inpatient Hospital Stay
Payer: No Typology Code available for payment source | Attending: Hematology and Oncology | Admitting: Hematology and Oncology

## 2023-11-13 VITALS — BP 134/83 | HR 83 | Temp 98.0°F | Resp 17 | Wt 179.2 lb

## 2023-11-13 DIAGNOSIS — M8580 Other specified disorders of bone density and structure, unspecified site: Secondary | ICD-10-CM | POA: Diagnosis not present

## 2023-11-13 DIAGNOSIS — Z79899 Other long term (current) drug therapy: Secondary | ICD-10-CM | POA: Insufficient documentation

## 2023-11-13 DIAGNOSIS — Z923 Personal history of irradiation: Secondary | ICD-10-CM | POA: Diagnosis not present

## 2023-11-13 DIAGNOSIS — Z87891 Personal history of nicotine dependence: Secondary | ICD-10-CM | POA: Insufficient documentation

## 2023-11-13 DIAGNOSIS — Z170 Estrogen receptor positive status [ER+]: Secondary | ICD-10-CM | POA: Diagnosis not present

## 2023-11-13 DIAGNOSIS — Z79811 Long term (current) use of aromatase inhibitors: Secondary | ICD-10-CM | POA: Insufficient documentation

## 2023-11-13 DIAGNOSIS — Z1721 Progesterone receptor positive status: Secondary | ICD-10-CM | POA: Insufficient documentation

## 2023-11-13 DIAGNOSIS — Z853 Personal history of malignant neoplasm of breast: Secondary | ICD-10-CM | POA: Insufficient documentation

## 2023-11-13 DIAGNOSIS — C50412 Malignant neoplasm of upper-outer quadrant of left female breast: Secondary | ICD-10-CM | POA: Insufficient documentation

## 2023-11-13 DIAGNOSIS — Z1732 Human epidermal growth factor receptor 2 negative status: Secondary | ICD-10-CM | POA: Diagnosis not present

## 2023-11-13 MED ORDER — ANASTROZOLE 1 MG PO TABS: 1.0000 mg | ORAL_TABLET | Freq: Every day | ORAL | 3 refills | Status: DC

## 2023-11-13 MED ORDER — CHOLECALCIFEROL 50 MCG (2000 UT) PO CAPS: 1.0000 | ORAL_CAPSULE | Freq: Every day | ORAL | Status: AC

## 2023-11-13 NOTE — Progress Notes (Signed)
Patient Care Team: Almond Lint, MD as Consulting Physician (General Surgery) Serena Croissant, MD as Consulting Physician (Hematology and Oncology) Dorothy Puffer, MD as Consulting Physician (Radiation Oncology)  DIAGNOSIS:  Encounter Diagnosis  Name Primary?   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  Ultrasound: 4 o'clock position: 1.1 cm solid papillary cancer (like DCIS) ER 100%, PR 15%, additionally 2 adjacent masses measuring 2 cm at 3 o'clock position on biopsy came back grade 1 IDC with micropapillary features, ER 100%, PR 100%, Ki-67 less than 1%, HER2 negative 1+   10/30/2022 Cancer Staging   Staging form: Breast, AJCC 8th Edition - Clinical stage from 10/30/2022: Stage IA (cT1b, cN0, cM0, G1, ER+, PR+, HER2-) - Signed by Ronny Bacon, PA-C on 10/30/2022 Method of lymph node assessment: Clinical Histologic grading system: 3 grade system   12/12/2022 Surgery   Left lumpectomy: 8:00: Fibrocystic change, 4:00: Grade 1 papillary carcinoma 1.5 cm, margins negative, ER 100%, PR 15%, 0/2 sentinel nodes negative, left lumpectomy 3:00: 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  She is on anastrozole for breast cancer and experiences typical menopausal symptoms without significant issues. No joint stiffness or morning achiness.  She engages in physical activity, primarily walking, and is attempting to incorporate more strength and weight-bearing exercises. A bone density test in December showed osteopenia, and she continues to monitor her bone health.  She recently stopped taking Benadryl, which she had been using for nasal drip and nighttime cough for three years, after her daughter, a Publishing rights manager, informed her of its potential link to dementia. She now manages her symptoms with a nasal spray. She also takes vitamin D3, 2000 IU daily, which she started alongside anastrozole.  She is due for her annual mammogram in April and is in the process of scheduling it.         ALLERGIES:  is allergic to latex and tape.  MEDICATIONS:  Current Outpatient Medications  Medication Sig Dispense Refill   Cholecalciferol 50 MCG (2000 UT) CAPS Take 1 capsule (2,000 Units total) by mouth daily at 12 noon.     acetaminophen (TYLENOL) 500 MG tablet Take 1,000 mg by mouth every 6 (six) hours as needed for moderate pain.     anastrozole (ARIMIDEX) 1 MG tablet Take 1 tablet (1 mg total) by mouth daily. 90 tablet 3   calcium elemental as carbonate (TUMS ULTRA 1000) 400 MG chewable tablet Chew 1,000-2,000 mg  by mouth daily as needed for heartburn.     diclofenac Sodium (VOLTAREN) 1 % GEL Apply 1 Application topically 4 (four) times daily as needed (pain).     ezetimibe (ZETIA) 10 MG tablet Take 10 mg by mouth daily.     FIBER PO Take 2 capsules by mouth daily.     fluticasone (FLONASE) 50 MCG/ACT nasal spray Place 1 spray into both nostrils daily  as needed for allergies or rhinitis.     hydrochlorothiazide (MICROZIDE) 12.5 MG capsule Take 12.5 mg by mouth daily.     Multiple Vitamin (MULTIVITAMIN WITH MINERALS) TABS tablet Take 1 tablet by mouth daily.     Omega-3 Fatty Acids (FISH OIL) 1000 MG CAPS Take 1,000 mg by mouth daily.     omeprazole (PRILOSEC) 40 MG capsule Take 40 mg by mouth daily as needed (acid reflux).     vitamin C (ASCORBIC ACID) 500 MG tablet Take 500 mg by mouth daily.     No current facility-administered medications for this visit.    PHYSICAL EXAMINATION: ECOG PERFORMANCE STATUS: 1 - Symptomatic but completely ambulatory  Vitals:   11/13/23 1545 11/13/23 1546  BP: (!) 151/94 134/83  Pulse: 83   Resp: 17   Temp: 98 F (36.7 C)   SpO2: 99%    Filed Weights   11/13/23 1545  Weight: 179 lb 3.2 oz (81.3 kg)    LABORATORY DATA:  I have reviewed the data as listed    Latest Ref Rng & Units 12/06/2022   10:47 AM 10/30/2022   12:17 PM 04/14/2018   12:52 PM  CMP  Glucose 70 - 99 mg/dL 161  096  96   BUN 8 - 23 mg/dL 13  15  9    Creatinine 0.44 - 1.00 mg/dL 0.45  4.09  8.11   Sodium 135 - 145 mmol/L 140  140  142   Potassium 3.5 - 5.1 mmol/L 3.4  3.5  3.6   Chloride 98 - 111 mmol/L 106  103  108   CO2 22 - 32 mmol/L 26  31  25    Calcium 8.9 - 10.3 mg/dL 9.4  91.4  9.7   Total Protein 6.5 - 8.1 g/dL  7.8  7.4   Total Bilirubin 0.3 - 1.2 mg/dL  0.4  1.0   Alkaline Phos 38 - 126 U/L  59  79   AST 15 - 41 U/L  21  35   ALT 0 - 44 U/L  22  33     Lab Results  Component Value Date   WBC 5.6 12/06/2022   HGB 13.1 12/06/2022   HCT 39.2 12/06/2022   MCV 91.8 12/06/2022   PLT 286 12/06/2022   NEUTROABS 2.7 10/30/2022    ASSESSMENT & PLAN:  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  Ultrasound: 4 o'clock position: 1.1 cm solid papillary cancer (like DCIS) ER 100%, PR 15%, additionally 2 adjacent masses measuring 2 cm at 3 o'clock position on biopsy came back grade 1 IDC with micropapillary features, ER 100%, PR 100%, Ki-67 less than 1%, HER2 negative 1+   11/15/2022: Left breast ultrasound-guided biopsy 8:00: Grade 2 IDC   Recommendations: 1.  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 since the invasive cancer was very small there is no role of Oncotype. 3. Adjuvant radiation therapy completed 02/17/23 4. Adjuvant antiestrogen therapy with Anastrozole 1 mg daily started 03/01/23 ---------------------------------------------------------------------------------------------------------  Anastrozole toxicities: Tolerating it extremely well without any problems or concerns.  Mild hot flashes and mild joint stiffness are age-related.  Breast cancer surveillance:Mammogram end of April 2025  Osteopenia Bone density scan in December showed osteopenia. Trinh is taking vitamin D3 to support bone health. Discussed the importance of weight-bearing exercises and vitamin D3 supplementation to maintain bone density. She is compliant with her current regimen. - Continue vitamin D3 2000 IU daily  Follow-up - Schedule follow-up visit in one year.          Orders Placed This Encounter  Procedures   MM DIAG BREAST TOMO BILATERAL    Standing Status:   Future    Expected Date:   01/16/2024    Expiration Date:   11/12/2024    Reason for Exam (SYMPTOM  OR DIAGNOSIS REQUIRED):   Annual mammograms with H/O breast cancer    Preferred imaging location?:   External             [facility]    Release to patient:    Immediate   The patient has a good understanding of the overall plan. she agrees with it. she will call with any problems that may develop before the next visit here. Total time spent: 30 mins including face to face time and time spent for planning, charting and co-ordination of care   Tamsen Meek, MD 11/13/23

## 2023-11-13 NOTE — Assessment & Plan Note (Signed)
2000: History of right breast cancer treated with lumpectomy ER/PR positive HER2 negative, adjuvant chemo (4 cycles of TC) 5 years of tamoxifen   10/18/2022: Screening mammogram detected left breast focal asymmetry left upper outer quadrant.  Ultrasound: 4 o'clock position: 1.1 cm solid papillary cancer (like DCIS) ER 100%, PR 15%, additionally 2 adjacent masses measuring 2 cm at 3 o'clock position on biopsy came back grade 1 IDC with micropapillary features, ER 100%, PR 100%, Ki-67 less than 1%, HER2 negative 1+   11/15/2022: Left breast ultrasound-guided biopsy 8:00: Grade 2 IDC   Recommendations: 1.  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 since the invasive cancer was very small there is no role of Oncotype. 3. Adjuvant radiation therapy completed 02/17/23 4. Adjuvant antiestrogen therapy with Anastrozole 1 mg daily started 03/01/23 ---------------------------------------------------------------------------------------------------------  Anastrozole toxicities:  Breast cancer surveillance: Breast exam 11/13/2023: Benign Mammogram end of April 2025  Return to clinic in 1 year for follow-up

## 2023-12-12 ENCOUNTER — Other Ambulatory Visit: Payer: Self-pay | Admitting: Hematology and Oncology

## 2024-01-12 ENCOUNTER — Ambulatory Visit: Payer: No Typology Code available for payment source

## 2024-01-31 ENCOUNTER — Encounter (HOSPITAL_BASED_OUTPATIENT_CLINIC_OR_DEPARTMENT_OTHER): Payer: Self-pay | Admitting: Emergency Medicine

## 2024-01-31 ENCOUNTER — Emergency Department (HOSPITAL_BASED_OUTPATIENT_CLINIC_OR_DEPARTMENT_OTHER)

## 2024-01-31 ENCOUNTER — Other Ambulatory Visit: Payer: Self-pay

## 2024-01-31 ENCOUNTER — Emergency Department (HOSPITAL_BASED_OUTPATIENT_CLINIC_OR_DEPARTMENT_OTHER)
Admission: EM | Admit: 2024-01-31 | Discharge: 2024-01-31 | Disposition: A | Discharge status: Home / Self Care | Attending: Emergency Medicine | Admitting: Emergency Medicine

## 2024-01-31 DIAGNOSIS — Z79899 Other long term (current) drug therapy: Secondary | ICD-10-CM | POA: Diagnosis not present

## 2024-01-31 DIAGNOSIS — Z91040 Latex allergy status: Secondary | ICD-10-CM | POA: Diagnosis not present

## 2024-01-31 DIAGNOSIS — Z85820 Personal history of malignant melanoma of skin: Secondary | ICD-10-CM | POA: Insufficient documentation

## 2024-01-31 DIAGNOSIS — K5792 Diverticulitis of intestine, part unspecified, without perforation or abscess without bleeding: Secondary | ICD-10-CM

## 2024-01-31 DIAGNOSIS — D72829 Elevated white blood cell count, unspecified: Secondary | ICD-10-CM | POA: Insufficient documentation

## 2024-01-31 DIAGNOSIS — K5732 Diverticulitis of large intestine without perforation or abscess without bleeding: Secondary | ICD-10-CM | POA: Diagnosis not present

## 2024-01-31 DIAGNOSIS — Z853 Personal history of malignant neoplasm of breast: Secondary | ICD-10-CM | POA: Insufficient documentation

## 2024-01-31 DIAGNOSIS — I10 Essential (primary) hypertension: Secondary | ICD-10-CM | POA: Insufficient documentation

## 2024-01-31 DIAGNOSIS — R1032 Left lower quadrant pain: Secondary | ICD-10-CM | POA: Diagnosis present

## 2024-01-31 LAB — URINALYSIS, ROUTINE W REFLEX MICROSCOPIC
Bilirubin Urine: NEGATIVE
Glucose, UA: NEGATIVE mg/dL
Hgb urine dipstick: NEGATIVE
Ketones, ur: NEGATIVE mg/dL
Leukocytes,Ua: NEGATIVE
Nitrite: NEGATIVE
Protein, ur: NEGATIVE mg/dL
Specific Gravity, Urine: 1.005 (ref 1.005–1.030)
pH: 7 (ref 5.0–8.0)

## 2024-01-31 LAB — CBC
HCT: 35.3 % — ABNORMAL LOW (ref 36.0–46.0)
Hemoglobin: 12.1 g/dL (ref 12.0–15.0)
MCH: 31.4 pg (ref 26.0–34.0)
MCHC: 34.3 g/dL (ref 30.0–36.0)
MCV: 91.7 fL (ref 80.0–100.0)
Platelets: 254 10*3/uL (ref 150–400)
RBC: 3.85 MIL/uL — ABNORMAL LOW (ref 3.87–5.11)
RDW: 12.8 % (ref 11.5–15.5)
WBC: 13 10*3/uL — ABNORMAL HIGH (ref 4.0–10.5)
nRBC: 0 % (ref 0.0–0.2)

## 2024-01-31 LAB — COMPREHENSIVE METABOLIC PANEL WITH GFR
ALT: 21 U/L (ref 0–44)
AST: 20 U/L (ref 15–41)
Albumin: 4.2 g/dL (ref 3.5–5.0)
Alkaline Phosphatase: 84 U/L (ref 38–126)
Anion gap: 11 (ref 5–15)
BUN: 10 mg/dL (ref 8–23)
CO2: 27 mmol/L (ref 22–32)
Calcium: 10.3 mg/dL (ref 8.9–10.3)
Chloride: 99 mmol/L (ref 98–111)
Creatinine, Ser: 0.9 mg/dL (ref 0.44–1.00)
GFR, Estimated: >60 mL/min (ref >60)
Glucose, Bld: 117 mg/dL — ABNORMAL HIGH (ref 70–99)
Potassium: 3.6 mmol/L (ref 3.5–5.1)
Sodium: 137 mmol/L (ref 135–145)
Total Bilirubin: 0.7 mg/dL (ref 0.0–1.2)
Total Protein: 7.2 g/dL (ref 6.5–8.1)

## 2024-01-31 LAB — LIPASE, BLOOD: Lipase: 24 U/L (ref 11–51)

## 2024-01-31 MED ORDER — ONDANSETRON 4 MG PO TBDP: 4.0000 mg | ORAL_TABLET | Freq: Three times a day (TID) | ORAL | 0 refills | Status: DC | PRN

## 2024-01-31 MED ORDER — OXYCODONE HCL 5 MG PO TABS: 2.5000 mg | ORAL_TABLET | Freq: Four times a day (QID) | ORAL | 0 refills | Status: DC | PRN

## 2024-01-31 MED ORDER — MORPHINE SULFATE (PF) 4 MG/ML IV SOLN
4.0000 mg | Freq: Once | INTRAVENOUS | Status: AC
  Administered 2024-01-31: 4 mg via INTRAVENOUS
  Filled 2024-01-31: qty 1

## 2024-01-31 MED ORDER — METRONIDAZOLE 500 MG PO TABS: 500.0000 mg | ORAL_TABLET | Freq: Three times a day (TID) | ORAL | 0 refills | Status: DC

## 2024-01-31 MED ORDER — IOHEXOL 300 MG/ML  SOLN
100.0000 mL | Freq: Once | INTRAMUSCULAR | Status: AC | PRN
  Administered 2024-01-31: 100 mL via INTRAVENOUS

## 2024-01-31 MED ORDER — CIPROFLOXACIN HCL 500 MG PO TABS: 500.0000 mg | ORAL_TABLET | Freq: Two times a day (BID) | ORAL | 0 refills | Status: DC

## 2024-01-31 NOTE — Discharge Instructions (Addendum)
 As discussed, your CT scan did show evidence of diverticulitis.  Will place you on 2 antibiotics for this.  See information attached to discharge papers regarding dietary changes to help your bowel rest.  Will also send in medication for pain.  You may take Tylenol  for baseline pain with a pain medication for breakthrough pain.  Please do not hesitate to return if the worrisome signs and symptoms we discussed become apparent.

## 2024-01-31 NOTE — ED Triage Notes (Signed)
 Bloated and lower abdominal pain for 3 days. Having bm's but mixed with hard and liquid, and abdomen worse with bm

## 2024-01-31 NOTE — ED Provider Notes (Signed)
 [place] EMERGENCY DEPARTMENT AT [facility] Provider Note   CSN: [id number] Arrival date & time: 01/31/24  4098     History  Chief Complaint  Patient presents with   Abdominal Pain    [name] is a 63 y.o. female.  HPI   63 year old female presents emergency department complaints of left lower abdominal pain.  Reports symptoms present for the past 3 days constant in nature.  Does report additional symptom of bloating.  States that she has had hard stool as well as loose bowel movements mixed since symptom onset.  Pain worsened with straining.  Denies any fevers, nausea, vomiting, urinary symptoms, hematochezia/melena.  Does report having colonoscopy 2 to 3 years ago with diverticulosis found but has never had diverticulitis.  Denies history of intra-abdominal surgeries.  Past medical history significant for GERD, hypertension, breast cancer, melanoma  Home Medications Prior to Admission medications   Medication Sig Start Date End Date Taking? Authorizing Provider  ciprofloxacin (CIPRO) 500 MG tablet Take 1 tablet (500 mg total) by mouth every 12 (twelve) hours. 01/31/24  Yes Neil Balls A, PA  metroNIDAZOLE (FLAGYL) 500 MG tablet Take 1 tablet (500 mg total) by mouth 3 (three) times daily. 01/31/24  Yes Neil Balls A, PA  ondansetron  (ZOFRAN -ODT) 4 MG disintegrating tablet Take 1 tablet (4 mg total) by mouth every 8 (eight) hours as needed. 01/31/24  Yes Neil Balls A, PA  oxyCODONE  (ROXICODONE ) 5 MG immediate release tablet Take 0.5 tablets (2.5 mg total) by mouth every 6 (six) hours as needed for severe pain (pain score 7-10) or breakthrough pain. 01/31/24  Yes Neil Balls A, PA  acetaminophen  (TYLENOL ) 500 MG tablet Take 1,000 mg by mouth every 6 (six) hours as needed for moderate pain.    [provider]  anastrozole  (ARIMIDEX ) 1 MG tablet TAKE 1 TABLET BY MOUTH EVERY DAY 12/12/23   Cameron Cea, MD  calcium elemental as carbonate (TUMS ULTRA 1000)  400 MG chewable tablet Chew 1,000-2,000 mg by mouth daily as needed for heartburn.    [provider]  Cholecalciferol  50 MCG (2000 UT) CAPS Take 1 capsule (2,000 Units total) by mouth daily at 12 noon. 11/13/23   Gudena, Vinay, MD  diclofenac  Sodium (VOLTAREN ) 1 % GEL Apply 1 Application topically 4 (four) times daily as needed (pain).    [provider]  ezetimibe (ZETIA) 10 MG tablet Take 10 mg by mouth daily. 11/28/22   [provider]  FIBER PO Take 2 capsules by mouth daily.    [provider]  fluticasone (FLONASE) 50 MCG/ACT nasal spray Place 1 spray into both nostrils daily as needed for allergies or rhinitis.    [provider]  hydrochlorothiazide (MICROZIDE) 12.5 MG capsule Take 12.5 mg by mouth daily. 10/26/22   [provider]  Multiple Vitamin (MULTIVITAMIN WITH MINERALS) TABS tablet Take 1 tablet by mouth daily.    [provider]  Omega-3 Fatty Acids (FISH OIL) 1000 MG CAPS Take 1,000 mg by mouth daily.    [provider]  omeprazole (PRILOSEC) 40 MG capsule Take 40 mg by mouth daily as needed (acid reflux).    [provider]  vitamin C (ASCORBIC ACID) 500 MG tablet Take 500 mg by mouth daily.    [provider]      Allergies    Latex and Tape    Review of Systems   Review of Systems  All other systems reviewed and are negative.   Physical Exam  Updated Vital Signs BP 115/72   Pulse 79   Temp 98 F (36.7 C) (Oral)   Resp 20   Ht 5\' 6"  (1.676 m)   Wt 81.2 kg   SpO2 100%   BMI 28.89 kg/m  Physical Exam Vitals and nursing note reviewed.  Constitutional:      General: She is not in acute distress.    Appearance: She is well-developed.  HENT:     Head: Normocephalic and atraumatic.  Eyes:     Conjunctiva/sclera: Conjunctivae normal.  Cardiovascular:     Rate and Rhythm: Normal rate and regular rhythm.     Heart sounds: No murmur heard. Pulmonary:     Effort: Pulmonary  effort is normal. No respiratory distress.     Breath sounds: Normal breath sounds.  Abdominal:     Palpations: Abdomen is soft.     Tenderness: There is abdominal tenderness in the left lower quadrant. There is no right CVA tenderness or left CVA tenderness.  Musculoskeletal:        General: No swelling.     Cervical back: Neck supple.  Skin:    General: Skin is warm and dry.     Capillary Refill: Capillary refill takes less than 2 seconds.  Neurological:     Mental Status: She is alert.  Psychiatric:        Mood and Affect: Mood normal.     ED Results / Procedures / Treatments   Labs (all labs ordered are listed, but only abnormal results are displayed) Labs Reviewed  COMPREHENSIVE METABOLIC PANEL WITH GFR - Abnormal; Notable for the following components:      Result Value   Glucose, Bld 117 (*)    All other components within normal limits  CBC - Abnormal; Notable for the following components:   WBC 13.0 (*)    RBC 3.85 (*)    HCT 35.3 (*)    All other components within normal limits  URINALYSIS, ROUTINE W REFLEX MICROSCOPIC - Abnormal; Notable for the following components:   Color, Urine COLORLESS (*)    All other components within normal limits  LIPASE, BLOOD    EKG None  Radiology CT ABDOMEN PELVIS W CONTRAST Result Date: 01/31/2024 CLINICAL DATA:  3 day history of left lower quadrant pain. EXAM: CT ABDOMEN AND PELVIS WITH CONTRAST TECHNIQUE: Multidetector CT imaging of the abdomen and pelvis was performed using the standard protocol following bolus administration of intravenous contrast. RADIATION DOSE REDUCTION: This exam was performed according to the departmental dose-optimization program which includes automated exposure control, adjustment of the mA and/or kV according to patient size and/or use of iterative reconstruction technique. CONTRAST:  100mL OMNIPAQUE IOHEXOL 300 MG/ML  SOLN COMPARISON:  No comparison studies available. FINDINGS: Lower chest: No acute  findings. Hepatobiliary: No suspicious focal abnormality within the liver parenchyma. There is no evidence for gallstones, gallbladder wall thickening, or pericholecystic fluid. No intrahepatic or extrahepatic biliary dilation. Pancreas: No focal mass lesion. No dilatation of the main duct. No intraparenchymal cyst. No peripancreatic edema. Spleen: No splenomegaly. No suspicious focal mass lesion. Adrenals/Urinary Tract: No adrenal nodule or mass. Left kidney unremarkable. Central sinus cyst noted right kidney. No evidence for hydroureter. The urinary bladder appears normal for the degree of distention. Stomach/Bowel: Stomach is unremarkable. No gastric wall thickening. No evidence of outlet obstruction. Duodenum is normally positioned as is the ligament of Treitz. No small bowel wall thickening. No small bowel dilatation. The terminal ileum is normal. The appendix is normal. Diverticular  disease is noted in the left colon. There is marked wall thickening in the proximal sigmoid colon with substantial pericolonic edema/inflammation. Imaging features compatible with acute diverticulitis. No evidence for perforation or abscess small lymph nodes in the adjacent mesentery are presumably reactive. Vascular/Lymphatic: There is mild atherosclerotic calcification of the abdominal aorta without aneurysm. There is no gastrohepatic or hepatoduodenal ligament lymphadenopathy. No retroperitoneal or mesenteric lymphadenopathy. No pelvic sidewall lymphadenopathy. Reproductive: Unremarkable. Other: Trace free fluid is seen in the cul-de-sac. Musculoskeletal: No worrisome lytic or sclerotic osseous abnormality. IMPRESSION: 1. Marked wall thickening in the proximal sigmoid colon with substantial pericolonic edema/inflammation. Imaging features compatible with acute diverticulitis. No evidence for perforation or abscess. 2. Trace free fluid in the cul-de-sac. 3.  Aortic Atherosclerosis (ICD10-I70.0). Electronically Signed   By: Donnal Fusi M.D.   On: 01/31/2024 11:53    Procedures Procedures    Medications Ordered in ED Medications  morphine (PF) 4 MG/ML injection 4 mg (4 mg Intravenous Given 01/31/24 1109)  iohexol (OMNIPAQUE) 300 MG/ML solution 100 mL (100 mLs Intravenous Contrast Given 01/31/24 1139)    ED Course/ Medical Decision Making/ A&P                                 Medical Decision Making Amount and/or Complexity of Data Reviewed Labs: ordered.   This patient presents to the ED for concern of abdominal pain, this involves an extensive number of treatment options, and is a complaint that carries with it a high risk of complications and morbidity.  The differential diagnosis includes gastritis, PUD, cholecystitis, CBD pathology, SBO/LBO, volvulus, diverticulitis, appendicitis, pyelonephritis, nephrolithiasias, ovarian torsion, cystitis, other     Co morbidities that complicate the patient evaluation   See HPI     Additional history obtained:   Additional history obtained from EMR External records from outside source obtained and reviewed including hospital records     Lab Tests:   I Ordered, and personally interpreted labs.  The pertinent results include: Leukocytosis of 13.  No evidence of anemia.  Platelets within range.  UA without abnormality.  No Electra abnormalities.  No transaminitis.  No renal dysfunction.  Lipase within normal limits.     Imaging Studies ordered:   I ordered imaging studies including CT abdomen pelvis I independently visualized and interpreted imaging which showed acute diverticulitis sigmoid: Without evidence of perforation/abscess.  Free fluid in cul-de-sac.  Aortic atherosclerosis. I agree with the radiologist interpretation    Cardiac Monitoring: / EKG:   The patient was maintained on a cardiac monitor.  I personally viewed and interpreted the cardiac monitored which showed an underlying rhythm of: Sinus rhythm     Consultations Obtained:   N/a      Problem List / ED Course / Critical interventions / Medication management   Diverticulitis I ordered medication including morphine Reevaluation of the patient after these medicines showed that the patient improved I have reviewed the patients home medicines and have made adjustments as needed     Social Determinants of Health:   Former cigarette use.  Denies illicit drug use.     Test / Admission - Considered:   Diverticulitis Vitals signs  within normal range and stable throughout visit. Laboratory/imaging studies significant for: See above 63 year old female presents emergency department complaints of left lower abdominal pain.  Reports symptoms present for the past 3 days constant in nature.  Does report additional symptom of bloating.  States that she has had hard stool as well as loose bowel movements mixed since symptom onset.  Pain worsened with straining.  Denies any fevers, nausea, vomiting, urinary symptoms, hematochezia/melena.  Does report having colonoscopy 2 to 3 years ago with diverticulosis found but has never had diverticulitis.  Denies history of intra-abdominal surgeries. On exam, reproducible abdominal tenderness in left lower quadrant.  Labs concerning for leukocytosis of 13.  CT imaging concerning for uncomplicated diverticulitis.  Suspect that this is most likely cause of patient's symptoms.  Patient does not meet SIRS criteria.  Will recommend dietary changes/lifestyle modifications, will place patient on antibiotics and recommend follow-up with PCP.  Treatment plan discussed with patient and she acknowledged understanding was agreeable to said plan.  Patient will well-appearing, afebrile in no acute distress. Worrisome signs and symptoms were discussed with the patient, and the patient acknowledged understanding to return to the ED if noticed. Patient was stable upon discharge.        Final Clinical Impression(s) / ED Diagnoses Final diagnoses:  Diverticulitis     Rx / DC Orders      [place] Butter, PA 01/31/24 1214    Lowery Rue, DO 01/31/24 1449

## 2024-01-31 NOTE — ED Notes (Signed)
 Pt given discharge instructions and reviewed prescriptions. Opportunities given for questions. Pt verbalizes understanding. PIV removed x1. Jillyn Hidden, RN

## 2024-02-05 ENCOUNTER — Telehealth: Payer: Self-pay | Admitting: Hematology and Oncology

## 2024-02-05 NOTE — Telephone Encounter (Signed)
 Left patient a vm regarding upcoming appointment

## 2024-03-08 ENCOUNTER — Ambulatory Visit: Attending: General Surgery

## 2024-03-08 VITALS — Wt 180.0 lb

## 2024-03-08 DIAGNOSIS — Z483 Aftercare following surgery for neoplasm: Secondary | ICD-10-CM | POA: Insufficient documentation

## 2024-03-08 NOTE — Therapy (Signed)
 OUTPATIENT PHYSICAL THERAPY SO[facility] SCREENING NOTE   Patient Name: [name] MRN: [id number] DOB:[birth year], 63 y.o., female Today's Date: 03/08/2024  PCP: Elester Grim, MD REFERRING PROVIDER: Lockie Rima, MD   PT End of Session - 03/08/24 1648     Visit Number 2   # unchanged due to screen only   PT Start Time 1645    PT Stop Time 1649    PT Time Calculation (min) 4 min    Activity Tolerance Patient tolerated treatment well    Behavior During Therapy [facility] for tasks assessed/performed             Past Medical History:  Diagnosis Date   Breast asymmetry 08/2018   Cancer (HCC) 2000   breast right, lumpectomy, chemo and radiation   Family history of colon cancer    Family history of melanoma    GERD (gastroesophageal reflux disease)    no meds   Hypertension    Personal history of malignant neoplasm of breast 03/14/2021   PONV (postoperative nausea and vomiting)    Past Surgical History:  Procedure Laterality Date   BREAST LUMPECTOMY WITH RADIOACTIVE SEED AND SENTINEL LYMPH NODE BIOPSY Left 12/12/2022   Procedure: LEFT BREAST LUMPECTOMY WITH RADIOACTIVE SEED X3;  Surgeon: Lockie Rima, MD;  Location: [facility];  Service: General;  Laterality: Left;   BREAST RECONSTRUCTION Right 07/23/2018   Procedure: RIGHT  BREAST RECONSTRUCTION;  Surgeon: Tommye Franc, MD;  Location: [facility];  Service: Plastics;  Laterality: Right;   BREAST RECONSTRUCTION Bilateral 01/31/2020   Procedure: BREAST RECONSTRUCTION;  Surgeon: Tommye Franc, MD;  Location: [facility];  Service: Plastics;  Laterality: Bilateral;   BREAST RECONSTRUCTION Left 06/26/2021   Procedure: BREAST RECONSTRUCTION WITH FAT TRANSFER;  Surgeon: Tommye Franc, MD;  Location: [facility];  Service: Plastics;  Laterality: Left;   BREAST SURGERY Right 2000   lumpectomy, sentinel node removed   CESAREAN SECTION     x 1   COLONOSCOPY WITH PROPOFOL  N/A  05/21/2016   Procedure: COLONOSCOPY WITH PROPOFOL ;  Surgeon: Garrett Kallman, MD;  Location: [facility];  Service: Endoscopy;  Laterality: N/A;   LIPOSUCTION WITH LIPOFILLING Bilateral 07/23/2018   Procedure: LIPOSUCTION AND FAT GRAFTING;  Surgeon: Contogiannis, Kevon Pellegrini, MD;  Location: [facility];  Service: Plastics;  Laterality: Bilateral;  TO RIGHT BREAST   LIPOSUCTION WITH LIPOFILLING Right 09/28/2018   Procedure: ABDOMEN, HIPS AND FLANKS LIPOSUCTION WITH MICRO FAT GRAFTING TO RIGHT BREAST;  Surgeon: Tommye Franc, MD;  Location: [facility];  Service: Plastics;  Laterality: Right;   LIPOSUCTION WITH LIPOFILLING Right 03/02/2019   Procedure: LIPOSUCTION OF ABDOMEN HIPS AND FLANKS WITH FAT GRAFTING TO RIGHT BREAT;  Surgeon: Contogiannis, Kevon Pellegrini, MD;  Location: [facility];  Service: Plastics;  Laterality: Right;   LIPOSUCTION WITH LIPOFILLING Bilateral 01/31/2020   Procedure: LIPOSUCTION WITH LIPOFILLING;  Surgeon: Contogiannis, Kevon Pellegrini, MD;  Location: [facility];  Service: Plastics;  Laterality: Bilateral;   LIPOSUCTION WITH LIPOFILLING Left 07/18/2020   Procedure: MICRO FAT GRAFTING USING LIPOSUCTION FROM ABDOMEN AND BILATERAL FLANKS;  Surgeon: Contogiannis, Kevon Pellegrini, MD;  Location: [facility];  Service: Plastics;  Laterality: Left;   LIPOSUCTION WITH LIPOFILLING Left 06/26/2021   Procedure: LIPOSUCTION WITH LIPOFILLING;  Surgeon: Contogiannis, Kevon Pellegrini, MD;  Location: [facility];  Service: Plastics;  Laterality: Left;   MASTECTOMY W/ SENTINEL NODE BIOPSY Left 12/12/2022  Procedure: SENTINEL LYMPH NODE BIOPSY;  Surgeon: Lockie Rima, MD;  Location: [facility];  Service: General;  Laterality: Left;   MASTOPEXY Left 07/13/2019   Procedure: LEFT BREAST MASTOPEXY;  Surgeon: Tommye Franc, MD;  Location: [facility];  Service: Plastics;  Laterality: Left;   TUBAL LIGATION      Patient Active Problem List   Diagnosis Date Noted   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 She underwent a left lumpectomy and sentinel node biopsy (2 negative nodes) on 12/12/2022. It is ER/PR positive and HER2 negative with a Ki67 of <1%. She had right breast cancer in 2000 with a right lumpectomy and sentinel node biopsy (2 negative nodes removed), chemotherapy and radiation. No hx right arm lymphedema.   PRECAUTIONS: left UE Lymphedema risk, None  SUBJECTIVE: Pt returns for her 3 month L-Dex screen.   PAIN:  Are you having pain? No  SOZO SCREENING: Patient was assessed today using the SOZO machine to determine the lymphedema index score. This was compared to her baseline score. It was determined that she is within the recommended range when compared to her baseline and no further action is needed at this time. She will continue SOZO screenings. These are done every 3 months for 2 years post operatively followed by every 6 months for 2 years, and then annually.   L-DEX FLOWSHEETS - 03/08/24 1600       L-DEX LYMPHEDEMA SCREENING   Measurement Type Unilateral    L-DEX MEASUREMENT EXTREMITY Upper Extremity    POSITION  Standing    DOMINANT SIDE Right    At Risk Side Left    BASELINE SCORE (UNILATERAL) -0.9    L-DEX SCORE (UNILATERAL) 0.2    VALUE CHANGE (UNILAT) 1.1               Denyce Flank, PTA 03/08/2024, 4:50 PM

## 2024-06-21 ENCOUNTER — Ambulatory Visit: Attending: General Surgery

## 2024-06-21 VITALS — Wt 178.4 lb

## 2024-06-21 DIAGNOSIS — Z483 Aftercare following surgery for neoplasm: Secondary | ICD-10-CM | POA: Insufficient documentation

## 2024-06-21 NOTE — Therapy (Signed)
 OUTPATIENT PHYSICAL THERAPY [facility] SCREENING NOTE   Patient Name: [name] MRN: [id number] DOB:1961/08/25, 63 y.o., female Today's Date: 06/21/2024  PCP: [name], MD REFERRING PROVIDER: [name], MD   PT End of Session - 06/21/24 1639     Visit Number 2   # unchanged due to screen only   PT Start Time 1637    PT Stop Time 1641    PT Time Calculation (min) 4 min    Activity Tolerance Patient tolerated treatment well    Behavior During Therapy [facility] for tasks assessed/performed          Past Medical History:  Diagnosis Date   Breast asymmetry 08/2018   Cancer (HCC) 2000   breast right, lumpectomy, chemo and radiation   Family history of colon cancer    Family history of melanoma    GERD (gastroesophageal reflux disease)    no meds   Hypertension    Personal history of malignant neoplasm of breast 03/14/2021   PONV (postoperative nausea and vomiting)    Past Surgical History:  Procedure Laterality Date   BREAST LUMPECTOMY WITH RADIOACTIVE SEED AND SENTINEL LYMPH NODE BIOPSY Left 12/12/2022   Procedure: LEFT BREAST LUMPECTOMY WITH RADIOACTIVE SEED X3;  Surgeon: [name], MD;  Location: [facility];  Service: General;  Laterality: Left;   BREAST RECONSTRUCTION Right 07/23/2018   Procedure: RIGHT  BREAST RECONSTRUCTION;  Surgeon: Creola Ronal Caldron, MD;  Location: [facility];  Service: Plastics;  Laterality: Right;   BREAST RECONSTRUCTION Bilateral 01/31/2020   Procedure: BREAST RECONSTRUCTION;  Surgeon: Creola Ronal Caldron, MD;  Location: [facility];  Service: Plastics;  Laterality: Bilateral;   BREAST RECONSTRUCTION Left 06/26/2021   Procedure: BREAST RECONSTRUCTION WITH FAT TRANSFER;  Surgeon: Creola Ronal Caldron, MD;  Location: [facility];  Service: Plastics;  Laterality: Left;   BREAST SURGERY Right 2000   lumpectomy, sentinel node removed   CESAREAN SECTION     x 1   COLONOSCOPY WITH PROPOFOL  N/A  05/21/2016   Procedure: COLONOSCOPY WITH PROPOFOL ;  Surgeon: Gladis MARLA Louder, MD;  Location: [facility];  Service: Endoscopy;  Laterality: N/A;   LIPOSUCTION WITH LIPOFILLING Bilateral 07/23/2018   Procedure: LIPOSUCTION AND FAT GRAFTING;  Surgeon: Contogiannis, Ronal Caldron, MD;  Location: [facility];  Service: Plastics;  Laterality: Bilateral;  TO RIGHT BREAST   LIPOSUCTION WITH LIPOFILLING Right 09/28/2018   Procedure: ABDOMEN, HIPS AND FLANKS LIPOSUCTION WITH MICRO FAT GRAFTING TO RIGHT BREAST;  Surgeon: Creola Ronal Caldron, MD;  Location: [facility];  Service: Plastics;  Laterality: Right;   LIPOSUCTION WITH LIPOFILLING Right 03/02/2019   Procedure: LIPOSUCTION OF ABDOMEN HIPS AND FLANKS WITH FAT GRAFTING TO RIGHT BREAT;  Surgeon: Contogiannis, Ronal Caldron, MD;  Location: [facility];  Service: Plastics;  Laterality: Right;   LIPOSUCTION WITH LIPOFILLING Bilateral 01/31/2020   Procedure: LIPOSUCTION WITH LIPOFILLING;  Surgeon: Contogiannis, Ronal Caldron, MD;  Location: [facility];  Service: Plastics;  Laterality: Bilateral;   LIPOSUCTION WITH LIPOFILLING Left 07/18/2020   Procedure: MICRO FAT GRAFTING USING LIPOSUCTION FROM ABDOMEN AND BILATERAL FLANKS;  Surgeon: Contogiannis, Ronal Caldron, MD;  Location: [facility];  Service: Plastics;  Laterality: Left;   LIPOSUCTION WITH LIPOFILLING Left 06/26/2021   Procedure: LIPOSUCTION WITH LIPOFILLING;  Surgeon: Contogiannis, Ronal Caldron, MD;  Location: [facility];  Service: Plastics;  Laterality: Left;   MASTECTOMY W/ SENTINEL NODE BIOPSY Left 12/12/2022   Procedure: [employer]  LYMPH NODE BIOPSY;  Surgeon: [name], MD;  Location: [facility];  Service: General;  Laterality: Left;   MASTOPEXY Left 07/13/2019   Procedure: LEFT BREAST MASTOPEXY;  Surgeon: Creola Ronal Caldron, MD;  Location: [facility];  Service: Plastics;  Laterality: Left;   TUBAL LIGATION      Patient Active Problem List   Diagnosis Date Noted   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 She underwent a left lumpectomy and sentinel node biopsy (2 negative nodes) on 12/12/2022. It is ER/PR positive and HER2 negative with a Ki67 of <1%. She had right breast cancer in 2000 with a right lumpectomy and sentinel node biopsy (2 negative nodes removed), chemotherapy and radiation. No hx right arm lymphedema.   PRECAUTIONS: left UE Lymphedema risk, None  SUBJECTIVE: Pt returns for her 3 month L-Dex screen.   PAIN:  Are you having pain? No  SOZO SCREENING: Patient was assessed today using the SOZO machine to determine the lymphedema index score. This was compared to her baseline score. It was determined that she is within the recommended range when compared to her baseline and no further action is needed at this time. She will continue SOZO screenings. These are done every 3 months for 2 years post operatively followed by every 6 months for 2 years, and then annually.   L-DEX FLOWSHEETS - 06/21/24 1600       L-DEX LYMPHEDEMA SCREENING   Measurement Type Unilateral    L-DEX MEASUREMENT EXTREMITY Upper Extremity    POSITION  Standing    DOMINANT SIDE Right    At Risk Side Left    BASELINE SCORE (UNILATERAL) -0.9    L-DEX SCORE (UNILATERAL) -0.3    VALUE CHANGE (UNILAT) 0.6            Aden Berwyn Caldron, PTA 06/21/2024, 4:40 PM

## 2024-09-20 ENCOUNTER — Ambulatory Visit: Attending: Internal Medicine

## 2024-09-20 VITALS — Wt 177.2 lb

## 2024-09-20 DIAGNOSIS — Z483 Aftercare following surgery for neoplasm: Secondary | ICD-10-CM | POA: Insufficient documentation

## 2024-09-20 NOTE — Therapy (Signed)
 " OUTPATIENT PHYSICAL THERAPY SOZO SCREENING NOTE   Patient Name: [name] MRN: [id number] DOB:09-13-1961, 63 y.o., female Today's Date: 09/20/2024  PCP: [name], MD REFERRING PROVIDER: [name], MD   PT End of Session - 09/20/24 1623     Visit Number 2   # unchanged due to screen only   PT Start Time 1621    PT Stop Time 1625    PT Time Calculation (min) 4 min    Activity Tolerance Patient tolerated treatment well    Behavior During Therapy [facility] for tasks assessed/performed          Past Medical History:  Diagnosis Date   Breast asymmetry 08/2018   Cancer (HCC) 2000   breast right, lumpectomy, chemo and radiation   Family history of colon cancer    Family history of melanoma    GERD (gastroesophageal reflux disease)    no meds   Hypertension    Personal history of malignant neoplasm of breast 03/14/2021   PONV (postoperative nausea and vomiting)    Past Surgical History:  Procedure Laterality Date   BREAST LUMPECTOMY WITH RADIOACTIVE SEED AND SENTINEL LYMPH NODE BIOPSY Left 12/12/2022   Procedure: LEFT BREAST LUMPECTOMY WITH RADIOACTIVE SEED X3;  Surgeon: [name], MD;  Location: [facility];  Service: General;  Laterality: Left;   BREAST RECONSTRUCTION Right 07/23/2018   Procedure: RIGHT  BREAST RECONSTRUCTION;  Surgeon: Creola Ronal Caldron, MD;  Location: [facility];  Service: Plastics;  Laterality: Right;   BREAST RECONSTRUCTION Bilateral 01/31/2020   Procedure: BREAST RECONSTRUCTION;  Surgeon: Creola Ronal Caldron, MD;  Location: [facility];  Service: Plastics;  Laterality: Bilateral;   BREAST RECONSTRUCTION Left 06/26/2021   Procedure: BREAST RECONSTRUCTION WITH FAT TRANSFER;  Surgeon: Creola Ronal Caldron, MD;  Location: [facility];  Service: Plastics;  Laterality: Left;   BREAST SURGERY Right 2000   lumpectomy, sentinel node removed   CESAREAN SECTION     x 1   COLONOSCOPY WITH PROPOFOL  N/A  05/21/2016   Procedure: COLONOSCOPY WITH PROPOFOL ;  Surgeon: Gladis MARLA Louder, MD;  Location: [facility];  Service: Endoscopy;  Laterality: N/A;   LIPOSUCTION WITH LIPOFILLING Bilateral 07/23/2018   Procedure: LIPOSUCTION AND FAT GRAFTING;  Surgeon: Contogiannis, Ronal Caldron, MD;  Location: [facility];  Service: Plastics;  Laterality: Bilateral;  TO RIGHT BREAST   LIPOSUCTION WITH LIPOFILLING Right 09/28/2018   Procedure: ABDOMEN, HIPS AND FLANKS LIPOSUCTION WITH MICRO FAT GRAFTING TO RIGHT BREAST;  Surgeon: Creola Ronal Caldron, MD;  Location: [facility];  Service: Plastics;  Laterality: Right;   LIPOSUCTION WITH LIPOFILLING Right 03/02/2019   Procedure: LIPOSUCTION OF ABDOMEN HIPS AND FLANKS WITH FAT GRAFTING TO RIGHT BREAT;  Surgeon: Contogiannis, Ronal Caldron, MD;  Location: [facility];  Service: Plastics;  Laterality: Right;   LIPOSUCTION WITH LIPOFILLING Bilateral 01/31/2020   Procedure: LIPOSUCTION WITH LIPOFILLING;  Surgeon: Contogiannis, Ronal Caldron, MD;  Location: [facility];  Service: Plastics;  Laterality: Bilateral;   LIPOSUCTION WITH LIPOFILLING Left 07/18/2020   Procedure: MICRO FAT GRAFTING USING LIPOSUCTION FROM ABDOMEN AND BILATERAL FLANKS;  Surgeon: Contogiannis, Ronal Caldron, MD;  Location: [facility];  Service: Plastics;  Laterality: Left;   LIPOSUCTION WITH LIPOFILLING Left 06/26/2021   Procedure: LIPOSUCTION WITH LIPOFILLING;  Surgeon: Contogiannis, Ronal Caldron, MD;  Location:  [facility];  Service: Plastics;  Laterality: Left;   MASTECTOMY W/ SENTINEL NODE BIOPSY Left 12/12/2022  Procedure: SENTINEL LYMPH NODE BIOPSY;  Surgeon: [name], MD;  Location: [facility];  Service: General;  Laterality: Left;   MASTOPEXY Left 07/13/2019   Procedure: LEFT BREAST MASTOPEXY;  Surgeon: Creola Ronal Caldron, MD;  Location: [facility];  Service: Plastics;  Laterality: Left;   TUBAL LIGATION      Patient Active Problem List   Diagnosis Date Noted   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 She underwent a left lumpectomy and sentinel node biopsy (2 negative nodes) on 12/12/2022. It is ER/PR positive and HER2 negative with a Ki67 of <1%. She had right breast cancer in 2000 with a right lumpectomy and sentinel node biopsy (2 negative nodes removed), chemotherapy and radiation. No hx right arm lymphedema.   PRECAUTIONS: left UE Lymphedema risk, None  SUBJECTIVE: Pt returns for her 3 month L-Dex screen.   PAIN:  Are you having pain? No  SOZO SCREENING: Patient was assessed today using the SOZO machine to determine the lymphedema index score. This was compared to her baseline score. It was determined that she is within the recommended range when compared to her baseline and no further action is needed at this time. She will continue SOZO screenings. These are done every 3 months for 2 years post operatively followed by every 6 months for 2 years, and then annually.   L-DEX FLOWSHEETS - 09/20/24 1600       L-DEX LYMPHEDEMA SCREENING   Measurement Type Unilateral    L-DEX MEASUREMENT EXTREMITY Upper Extremity    POSITION  Standing    DOMINANT SIDE Right    At Risk Side Left    BASELINE SCORE (UNILATERAL) -0.9    L-DEX SCORE (UNILATERAL) 0.7    VALUE CHANGE (UNILAT) 1.6         P: Cont every 3 month L-Dex screens until 11/2024, then can transition to 6 months until 11/2026.    Aden Berwyn Caldron, PTA 09/20/2024, 4:24 PM      "

## 2024-10-04 ENCOUNTER — Inpatient Hospital Stay: Admitting: Hematology and Oncology

## 2024-10-12 ENCOUNTER — Inpatient Hospital Stay: Attending: Hematology and Oncology | Admitting: Hematology and Oncology

## 2024-10-12 VITALS — BP 134/78 | HR 85 | Temp 97.5°F | Resp 16 | Wt 176.2 lb

## 2024-10-12 DIAGNOSIS — Z79811 Long term (current) use of aromatase inhibitors: Secondary | ICD-10-CM | POA: Insufficient documentation

## 2024-10-12 DIAGNOSIS — Z170 Estrogen receptor positive status [ER+]: Secondary | ICD-10-CM | POA: Insufficient documentation

## 2024-10-12 DIAGNOSIS — Z923 Personal history of irradiation: Secondary | ICD-10-CM | POA: Diagnosis not present

## 2024-10-12 DIAGNOSIS — Z9221 Personal history of antineoplastic chemotherapy: Secondary | ICD-10-CM | POA: Diagnosis not present

## 2024-10-12 DIAGNOSIS — C50412 Malignant neoplasm of upper-outer quadrant of left female breast: Secondary | ICD-10-CM | POA: Diagnosis not present

## 2024-10-12 DIAGNOSIS — Z1721 Progesterone receptor positive status: Secondary | ICD-10-CM | POA: Diagnosis not present

## 2024-10-12 DIAGNOSIS — M8580 Other specified disorders of bone density and structure, unspecified site: Secondary | ICD-10-CM | POA: Insufficient documentation

## 2024-10-12 DIAGNOSIS — Z1732 Human epidermal growth factor receptor 2 negative status: Secondary | ICD-10-CM | POA: Insufficient documentation

## 2024-10-12 MED ORDER — ANASTROZOLE 1 MG PO TABS: 1.0000 mg | ORAL_TABLET | Freq: Every day | ORAL | 3 refills | Status: AC

## 2024-10-12 NOTE — Progress Notes (Signed)
 "  Patient Care Team: [name], MD as PCP - General (Internal Medicine) [name], MD as Consulting Physician (General Surgery) [name], MD as Consulting Physician (Hematology and Oncology) [name], MD as Consulting Physician (Radiation Oncology)  DIAGNOSIS:  Encounter Diagnosis  Name Primary?   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  Ultrasound: 4 o'clock position: 1.1 cm solid papillary cancer (like DCIS) ER 100%, PR 15%, additionally 2 adjacent masses measuring 2 cm at 3 o'clock position on biopsy came back grade 1 IDC with micropapillary features, ER 100%, PR 100%, Ki-67 less than 1%, HER2 negative 1+   10/30/2022 Cancer Staging   Staging form: Breast, AJCC 8th Edition - Clinical stage from 10/30/2022: Stage IA (cT1b, cN0, cM0, G1, ER+, PR+, HER2-) - Signed by Lanell Donald Stagger, PA-C on 10/30/2022 Method of lymph node assessment: Clinical Histologic grading system: 3 grade system   12/12/2022 Surgery   Left lumpectomy: 8:00: Fibrocystic change, 4:00: Grade 1 papillary carcinoma 1.5 cm, margins negative, ER 100%, PR 15%, 0/2 sentinel nodes negative, left lumpectomy 3:00: 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, margins negative, ER 100%, PR 100%, HER2 negative 1+, Ki-67 1%   01/23/2023 - 02/17/2023 Radiation Therapy   Adj XRT   03/2023 -  Anti-estrogen oral therapy   Anastrozole  daily   05/13/2023 Cancer Staging   Staging form: Breast, AJCC 8th Edition - Pathologic: Stage IA (pT1c, pN0, cM0, G1, ER+, PR+, HER2-) - Signed by Crawford Morna Pickle, NP on 05/13/2023 Histologic grading system: 3 grade system     CHIEF COMPLIANT: Surveillance of breast cancer  HISTORY OF PRESENT ILLNESS:  History of Present Illness [name] is a 64 year old female with stage IA multifocal left breast cancer status post lumpectomy, adjuvant radiation, and ongoing anastrozole  therapy, presenting for routine oncology follow-up and surveillance.  She is maintained on anastrozole  without new or concerning side effects and needs refills. She denies new breast masses, pain, or nipple discharge. Surveillance mammography is scheduled for February.  She notes stable scar tissue at the surgical site and has not enrolled in a new lymphedema program since her prior program closed. She is considering resuming exercise at the [facility]. She denies arm lymphedema and has undergone SOZO lymphedema screening.  She asked about blood-based surveillance for recurrence and discussed the [employer] Reveal circulating tumor DNA test. Routine laboratory monitoring is managed by her primary care provider.      ALLERGIES:  is allergic to latex and tape.  MEDICATIONS:  Current Outpatient Medications  Medication Sig Dispense Refill   acetaminophen  (TYLENOL ) 500 MG tablet Take 1,000 mg by mouth every 6 (six) hours as needed for moderate pain.     anastrozole  (ARIMIDEX ) 1 MG tablet TAKE 1 TABLET BY MOUTH EVERY DAY 90 tablet 3   calcium elemental as carbonate (TUMS ULTRA 1000) 400 MG chewable tablet Chew 1,000-2,000 mg by mouth daily as needed for heartburn.     Cholecalciferol  50 MCG (2000 UT) CAPS Take 1 capsule (2,000 Units total) by mouth daily at 12 noon.     ciprofloxacin  (CIPRO ) 500 MG  tablet Take 1 tablet (500 mg total) by mouth every 12 (twelve) hours. 10 tablet 0   diclofenac  Sodium (VOLTAREN ) 1 % GEL Apply 1 Application topically 4 (four) times daily as needed (pain).     ezetimibe (ZETIA) 10 MG tablet Take 10 mg by mouth daily.     FIBER PO Take 2  capsules by mouth daily.     fluticasone (FLONASE) 50 MCG/ACT nasal spray Place 1 spray into both nostrils daily as needed for allergies or rhinitis.     hydrochlorothiazide (MICROZIDE) 12.5 MG capsule Take 12.5 mg by mouth daily.     metroNIDAZOLE  (FLAGYL ) 500 MG tablet Take 1 tablet (500 mg total) by mouth 3 (three) times daily. 15 tablet 0   Multiple Vitamin (MULTIVITAMIN WITH MINERALS) TABS tablet Take 1 tablet by mouth daily.     Omega-3 Fatty Acids (FISH OIL) 1000 MG CAPS Take 1,000 mg by mouth daily.     omeprazole (PRILOSEC) 40 MG capsule Take 40 mg by mouth daily as needed (acid reflux).     ondansetron  (ZOFRAN -ODT) 4 MG disintegrating tablet Take 1 tablet (4 mg total) by mouth every 8 (eight) hours as needed. 20 tablet 0   oxyCODONE  (ROXICODONE ) 5 MG immediate release tablet Take 0.5 tablets (2.5 mg total) by mouth every 6 (six) hours as needed for severe pain (pain score 7-10) or breakthrough pain. 10 tablet 0   vitamin C (ASCORBIC ACID) 500 MG tablet Take 500 mg by mouth daily.     No current facility-administered medications for this visit.    PHYSICAL EXAMINATION: ECOG PERFORMANCE STATUS: 1 - Symptomatic but completely ambulatory  Vitals:   10/12/24 1525  BP: 134/78  Pulse: 85  Resp: 16  Temp: (!) 97.5 F (36.4 C)  SpO2: 98%   Filed Weights   10/12/24 1525  Weight: 176 lb 3.2 oz (79.9 kg)    Physical Exam Breast exam: Benign mild lymphedema left breast medial aspect   EXTREMITIES: No abnormalities on the arm.  (exam performed in the presence of a chaperone)  LABORATORY DATA:  I have reviewed the data as listed    Latest Ref Rng & Units 01/31/2024   10:55 AM 12/06/2022   10:47 AM 10/30/2022   12:17 PM  CMP  Glucose 70 - 99 mg/dL 882  853  897   BUN 8 - 23 mg/dL 10  13  15    Creatinine 0.44 - 1.00 mg/dL 9.09  9.12  9.13   Sodium 135 - 145 mmol/L 137  140  140   Potassium 3.5 - 5.1 mmol/L 3.6  3.4  3.5   Chloride 98 - 111 mmol/L 99  106  103   CO2 22 - 32  mmol/L 27  26  31    Calcium 8.9 - 10.3 mg/dL 89.6  9.4  89.4   Total Protein 6.5 - 8.1 g/dL 7.2   7.8   Total Bilirubin 0.0 - 1.2 mg/dL 0.7   0.4   Alkaline Phos 38 - 126 U/L 84   59   AST 15 - 41 U/L 20   21   ALT 0 - 44 U/L 21   22     Lab Results  Component Value Date   WBC 13.0 (H) 01/31/2024   HGB 12.1 01/31/2024   HCT 35.3 (L) 01/31/2024   MCV 91.7 01/31/2024   PLT 254 01/31/2024   NEUTROABS 2.7 10/30/2022    ASSESSMENT & PLAN:  Primary malignant neoplasm of upper outer quadrant of breast, left (  HCC) 2000: History of right breast cancer treated with lumpectomy ER/PR positive HER2 negative, adjuvant chemo (4 cycles of TC) 5 years of tamoxifen   10/18/2022: Screening mammogram detected left breast focal asymmetry left upper outer quadrant.  Ultrasound: 4 o'clock position: 1.1 cm solid papillary cancer (like DCIS) ER 100%, PR 15%, additionally 2 adjacent masses measuring 2 cm at 3 o'clock position on biopsy came back grade 1 IDC with micropapillary features, ER 100%, PR 100%, Ki-67 less than 1%, HER2 negative 1+   11/15/2022: Left breast ultrasound-guided biopsy 8:00: Grade 2 IDC   Treatment summary: 1.  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 since the invasive cancer was very small there is no role of Oncotype. 3. Adjuvant radiation therapy completed 02/17/23 4. Adjuvant antiestrogen therapy with Anastrozole  1 mg daily started 03/01/23 ---------------------------------------------------------------------------------------------------------  Anastrozole  toxicities: Tolerating it extremely well without any problems or concerns.  Mild hot flashes and mild joint stiffness are age-related.   Breast cancer surveillance: Mammogram end of April  2025 Breast exam 10/12/2024: Benign Recommend guardant reveal for MRD monitoring   Osteopenia: Weightbearing exercise calcium vitamin D  Return to clinic in 1 year for follow-up    No orders of the defined types were placed in this encounter.  The patient has a good understanding of the overall plan. she agrees with it. she will call with any problems that may develop before the next visit here.  I personally spent a total of 30 minutes in the care of the patient today including preparing to see the patient, getting/reviewing separately obtained history, performing a medically appropriate exam/evaluation, counseling and educating, placing orders, referring and communicating with other health care professionals, documenting clinical information in the EHR, independently interpreting results, communicating results, and coordinating care.   Carly K Jaz Mallick, MD 10/12/2024    "

## 2024-10-12 NOTE — Assessment & Plan Note (Signed)
 2000: History of right breast cancer treated with lumpectomy ER/PR positive HER2 negative, adjuvant chemo (4 cycles of TC) 5 years of tamoxifen   10/18/2022: Screening mammogram detected left breast focal asymmetry left upper outer quadrant.  Ultrasound: 4 o'clock position: 1.1 cm solid papillary cancer (like DCIS) ER 100%, PR 15%, additionally 2 adjacent masses measuring 2 cm at 3 o'clock position on biopsy came back grade 1 IDC with micropapillary features, ER 100%, PR 100%, Ki-67 less than 1%, HER2 negative 1+   11/15/2022: Left breast ultrasound-guided biopsy 8:00: Grade 2 IDC   Treatment summary: 1.  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 since the invasive cancer was very small there is no role of Oncotype. 3. Adjuvant radiation therapy completed 02/17/23 4. Adjuvant antiestrogen therapy with Anastrozole  1 mg daily started 03/01/23 ---------------------------------------------------------------------------------------------------------  Anastrozole  toxicities: Tolerating it extremely well without any problems or concerns.  Mild hot flashes and mild joint stiffness are age-related.   Breast cancer surveillance: Mammogram end of April 2025 Breast exam 10/12/2024: Benign   Osteopenia: Weightbearing exercise calcium vitamin D  Return to clinic in 1 year for follow-up

## 2024-10-13 ENCOUNTER — Encounter: Payer: Self-pay | Admitting: *Deleted

## 2024-10-13 NOTE — Progress Notes (Signed)
 Per MD request, guardant reveal orders placed via their portal.

## 2024-11-15 ENCOUNTER — Ambulatory Visit: Payer: No Typology Code available for payment source | Admitting: Hematology and Oncology

## 2024-12-20 ENCOUNTER — Ambulatory Visit

## 2025-10-12 ENCOUNTER — Inpatient Hospital Stay: Admitting: Hematology and Oncology
